# Patient Record
Sex: Female | Born: 1975 | Race: Black or African American | Hispanic: No | Smoking: Never smoker
Health system: Southern US, Community
[De-identification: ages and names within clinical notes are randomized; demographics above are authoritative.]

## PROBLEM LIST (undated history)

## (undated) DIAGNOSIS — D509 Iron deficiency anemia, unspecified: Secondary | ICD-10-CM

## (undated) DIAGNOSIS — Z973 Presence of spectacles and contact lenses: Secondary | ICD-10-CM

## (undated) DIAGNOSIS — O0990 Supervision of high risk pregnancy, unspecified, unspecified trimester: Secondary | ICD-10-CM

## (undated) DIAGNOSIS — Z3183 Encounter for assisted reproductive fertility procedure cycle: Secondary | ICD-10-CM

## (undated) DIAGNOSIS — D259 Leiomyoma of uterus, unspecified: Secondary | ICD-10-CM

## (undated) DIAGNOSIS — E282 Polycystic ovarian syndrome: Secondary | ICD-10-CM

## (undated) DIAGNOSIS — Z86711 Personal history of pulmonary embolism: Secondary | ICD-10-CM

## (undated) HISTORY — DX: Supervision of high risk pregnancy, unspecified, unspecified trimester: O09.90

## (undated) HISTORY — DX: Encounter for assisted reproductive fertility procedure cycle: Z31.83

## (undated) HISTORY — PX: MYOMECTOMY: SHX85

## (undated) HISTORY — DX: Personal history of pulmonary embolism: Z86.711

## (undated) HISTORY — PX: NO PAST SURGERIES: SHX2092

---

## 1998-04-22 ENCOUNTER — Other Ambulatory Visit: Admission: RE | Admit: 1998-04-22 | Discharge: 1998-04-22 | Payer: Self-pay | Admitting: Obstetrics and Gynecology

## 2001-09-23 ENCOUNTER — Other Ambulatory Visit: Admission: RE | Admit: 2001-09-23 | Discharge: 2001-09-23 | Payer: Self-pay | Admitting: Obstetrics and Gynecology

## 2002-10-06 ENCOUNTER — Other Ambulatory Visit: Admission: RE | Admit: 2002-10-06 | Discharge: 2002-10-06 | Payer: Self-pay | Admitting: Obstetrics and Gynecology

## 2002-12-13 ENCOUNTER — Emergency Department (HOSPITAL_COMMUNITY): Admission: EM | Admit: 2002-12-13 | Discharge: 2002-12-14 | Payer: Self-pay | Admitting: Emergency Medicine

## 2003-07-21 ENCOUNTER — Other Ambulatory Visit: Admission: RE | Admit: 2003-07-21 | Discharge: 2003-07-21 | Payer: Self-pay | Admitting: Obstetrics and Gynecology

## 2003-11-18 ENCOUNTER — Ambulatory Visit (HOSPITAL_COMMUNITY): Admission: RE | Admit: 2003-11-18 | Discharge: 2003-11-18 | Payer: Self-pay | Admitting: Obstetrics and Gynecology

## 2004-02-06 ENCOUNTER — Inpatient Hospital Stay (HOSPITAL_COMMUNITY): Admission: AD | Admit: 2004-02-06 | Discharge: 2004-02-09 | Payer: Self-pay | Admitting: *Deleted

## 2004-02-07 ENCOUNTER — Encounter (INDEPENDENT_AMBULATORY_CARE_PROVIDER_SITE_OTHER): Payer: Self-pay | Admitting: Specialist

## 2004-02-10 ENCOUNTER — Encounter: Admission: RE | Admit: 2004-02-10 | Discharge: 2004-03-11 | Payer: Self-pay | Admitting: Obstetrics and Gynecology

## 2004-03-07 ENCOUNTER — Other Ambulatory Visit: Admission: RE | Admit: 2004-03-07 | Discharge: 2004-03-07 | Payer: Self-pay | Admitting: Obstetrics and Gynecology

## 2004-03-12 ENCOUNTER — Encounter: Admission: RE | Admit: 2004-03-12 | Discharge: 2004-04-11 | Payer: Self-pay | Admitting: Obstetrics and Gynecology

## 2004-05-12 ENCOUNTER — Encounter: Admission: RE | Admit: 2004-05-12 | Discharge: 2004-06-11 | Payer: Self-pay | Admitting: Obstetrics and Gynecology

## 2004-06-12 ENCOUNTER — Encounter: Admission: RE | Admit: 2004-06-12 | Discharge: 2004-07-12 | Payer: Self-pay | Admitting: *Deleted

## 2004-08-10 ENCOUNTER — Encounter: Admission: RE | Admit: 2004-08-10 | Discharge: 2004-09-09 | Payer: Self-pay | Admitting: Obstetrics and Gynecology

## 2005-04-05 ENCOUNTER — Other Ambulatory Visit: Admission: RE | Admit: 2005-04-05 | Discharge: 2005-04-05 | Payer: Self-pay | Admitting: Obstetrics and Gynecology

## 2007-04-30 LAB — CONVERTED CEMR LAB: Pap Smear: NORMAL

## 2007-06-09 ENCOUNTER — Ambulatory Visit: Payer: Self-pay | Admitting: Internal Medicine

## 2007-06-09 DIAGNOSIS — D509 Iron deficiency anemia, unspecified: Secondary | ICD-10-CM | POA: Insufficient documentation

## 2007-06-09 DIAGNOSIS — R51 Headache: Secondary | ICD-10-CM | POA: Insufficient documentation

## 2007-06-09 DIAGNOSIS — N83209 Unspecified ovarian cyst, unspecified side: Secondary | ICD-10-CM | POA: Insufficient documentation

## 2007-06-09 DIAGNOSIS — R519 Headache, unspecified: Secondary | ICD-10-CM | POA: Insufficient documentation

## 2007-06-09 DIAGNOSIS — R32 Unspecified urinary incontinence: Secondary | ICD-10-CM | POA: Insufficient documentation

## 2007-06-09 DIAGNOSIS — A6000 Herpesviral infection of urogenital system, unspecified: Secondary | ICD-10-CM | POA: Insufficient documentation

## 2007-06-09 DIAGNOSIS — R350 Frequency of micturition: Secondary | ICD-10-CM | POA: Insufficient documentation

## 2007-06-09 LAB — CONVERTED CEMR LAB
Bilirubin Urine: NEGATIVE
Glucose, Urine, Semiquant: NEGATIVE
Ketones, urine, test strip: NEGATIVE
Nitrite: NEGATIVE
Protein, U semiquant: NEGATIVE
Specific Gravity, Urine: 1.025
Urobilinogen, UA: 0.2
WBC Urine, dipstick: NEGATIVE
pH: 7

## 2007-06-13 LAB — CONVERTED CEMR LAB
ALT: 14 units/L (ref 0–35)
AST: 14 units/L (ref 0–37)
Albumin: 4.3 g/dL (ref 3.5–5.2)
Alkaline Phosphatase: 34 units/L — ABNORMAL LOW (ref 39–117)
BUN: 13 mg/dL (ref 6–23)
Basophils Absolute: 0 10*3/uL (ref 0.0–0.1)
Basophils Relative: 0.1 % (ref 0.0–1.0)
Bilirubin, Direct: 0.1 mg/dL (ref 0.0–0.3)
CO2: 27 meq/L (ref 19–32)
Calcium: 9.5 mg/dL (ref 8.4–10.5)
Chloride: 106 meq/L (ref 96–112)
Cholesterol: 166 mg/dL (ref 0–200)
Creatinine, Ser: 0.8 mg/dL (ref 0.4–1.2)
Eosinophils Absolute: 0 10*3/uL (ref 0.0–0.6)
Eosinophils Relative: 0.7 % (ref 0.0–5.0)
Ferritin: 11.2 ng/mL (ref 10.0–291.0)
Folate: 11.1 ng/mL
GFR calc Af Amer: 108 mL/min
GFR calc non Af Amer: 90 mL/min
Glucose, Bld: 80 mg/dL (ref 70–99)
HCT: 36.7 % (ref 36.0–46.0)
HDL: 53.8 mg/dL (ref >39.0)
Hemoglobin: 11.6 g/dL — ABNORMAL LOW (ref 12.0–15.0)
LDL Cholesterol: 105 mg/dL — ABNORMAL HIGH (ref 0–99)
Lymphocytes Relative: 33.3 % (ref 12.0–46.0)
MCHC: 31.5 g/dL (ref 30.0–36.0)
MCV: 70.3 fL — ABNORMAL LOW (ref 78.0–100.0)
Monocytes Absolute: 0.4 10*3/uL (ref 0.2–0.7)
Monocytes Relative: 8.7 % (ref 3.0–11.0)
Neutro Abs: 2.5 10*3/uL (ref 1.4–7.7)
Neutrophils Relative %: 57.2 % (ref 43.0–77.0)
Platelets: 234 10*3/uL (ref 150–400)
Potassium: 4.1 meq/L (ref 3.5–5.1)
RBC: 5.22 M/uL — ABNORMAL HIGH (ref 3.87–5.11)
RDW: 13 % (ref 11.5–14.6)
Sodium: 140 meq/L (ref 135–145)
TSH: 0.81 microintl units/mL (ref 0.35–5.50)
Total Bilirubin: 0.7 mg/dL (ref 0.3–1.2)
Total CHOL/HDL Ratio: 3.1
Total Protein: 6.7 g/dL (ref 6.0–8.3)
Triglycerides: 34 mg/dL (ref 0–149)
VLDL: 7 mg/dL (ref 0–40)
Vitamin B-12: 318 pg/mL (ref 211–911)
WBC: 4.4 10*3/uL — ABNORMAL LOW (ref 4.5–10.5)

## 2007-06-23 ENCOUNTER — Encounter: Payer: Self-pay | Admitting: Internal Medicine

## 2008-04-06 LAB — CONVERTED CEMR LAB: Pap Smear: NORMAL

## 2009-02-28 ENCOUNTER — Ambulatory Visit: Payer: Self-pay | Admitting: Internal Medicine

## 2009-02-28 LAB — CONVERTED CEMR LAB
Bilirubin Urine: NEGATIVE
Blood in Urine, dipstick: NEGATIVE
Glucose, Urine, Semiquant: NEGATIVE
Ketones, urine, test strip: NEGATIVE
Nitrite: NEGATIVE
Protein, U semiquant: NEGATIVE
Specific Gravity, Urine: 1.02
Urobilinogen, UA: 0.2
WBC Urine, dipstick: NEGATIVE
pH: 7

## 2009-03-02 LAB — CONVERTED CEMR LAB
ALT: 13 units/L (ref 0–35)
AST: 14 units/L (ref 0–37)
Albumin: 4.3 g/dL (ref 3.5–5.2)
Alkaline Phosphatase: 41 units/L (ref 39–117)
BUN: 12 mg/dL (ref 6–23)
Basophils Absolute: 0 10*3/uL (ref 0.0–0.1)
Basophils Relative: 0.8 % (ref 0.0–3.0)
Bilirubin, Direct: 0 mg/dL (ref 0.0–0.3)
CO2: 27 meq/L (ref 19–32)
Calcium: 9.2 mg/dL (ref 8.4–10.5)
Chloride: 106 meq/L (ref 96–112)
Cholesterol: 172 mg/dL (ref 0–200)
Creatinine, Ser: 0.9 mg/dL (ref 0.4–1.2)
Eosinophils Absolute: 0 10*3/uL (ref 0.0–0.7)
Eosinophils Relative: 0.8 % (ref 0.0–5.0)
GFR calc non Af Amer: 93.1 mL/min (ref >60)
Glucose, Bld: 78 mg/dL (ref 70–99)
HCT: 38.1 % (ref 36.0–46.0)
HDL: 52 mg/dL (ref >39.00)
Hemoglobin: 12.3 g/dL (ref 12.0–15.0)
LDL Cholesterol: 112 mg/dL — ABNORMAL HIGH (ref 0–99)
Lymphocytes Relative: 31.1 % (ref 12.0–46.0)
Lymphs Abs: 1.5 10*3/uL (ref 0.7–4.0)
MCHC: 32.3 g/dL (ref 30.0–36.0)
MCV: 71.9 fL — ABNORMAL LOW (ref 78.0–100.0)
Monocytes Absolute: 0.4 10*3/uL (ref 0.1–1.0)
Monocytes Relative: 8 % (ref 3.0–12.0)
Neutro Abs: 2.8 10*3/uL (ref 1.4–7.7)
Neutrophils Relative %: 59.3 % (ref 43.0–77.0)
Platelets: 226 10*3/uL (ref 150.0–400.0)
Potassium: 4 meq/L (ref 3.5–5.1)
RBC: 5.3 M/uL — ABNORMAL HIGH (ref 3.87–5.11)
RDW: 13.2 % (ref 11.5–14.6)
Sodium: 141 meq/L (ref 135–145)
TSH: 1.06 microintl units/mL (ref 0.35–5.50)
Total Bilirubin: 0.8 mg/dL (ref 0.3–1.2)
Total CHOL/HDL Ratio: 3
Total Protein: 7.2 g/dL (ref 6.0–8.3)
Triglycerides: 42 mg/dL (ref 0.0–149.0)
VLDL: 8.4 mg/dL (ref 0.0–40.0)
WBC: 4.7 10*3/uL (ref 4.5–10.5)

## 2009-11-02 ENCOUNTER — Telehealth (INDEPENDENT_AMBULATORY_CARE_PROVIDER_SITE_OTHER): Payer: Self-pay | Admitting: *Deleted

## 2009-11-03 ENCOUNTER — Ambulatory Visit: Payer: Self-pay | Admitting: Internal Medicine

## 2009-11-03 DIAGNOSIS — L723 Sebaceous cyst: Secondary | ICD-10-CM | POA: Insufficient documentation

## 2009-11-03 DIAGNOSIS — N760 Acute vaginitis: Secondary | ICD-10-CM | POA: Insufficient documentation

## 2009-11-03 DIAGNOSIS — Z87898 Personal history of other specified conditions: Secondary | ICD-10-CM | POA: Insufficient documentation

## 2009-11-16 LAB — CONVERTED CEMR LAB
Chlamydia, Swab/Urine, PCR: NEGATIVE
GC Probe Amp, Urine: NEGATIVE

## 2010-06-06 NOTE — Progress Notes (Signed)
  Phone Note Call from Patient   Caller: Patient Call For: Madelin Headings MD Summary of Call: Pt had a flu shot several months ago, and now has a knot at the site???  Is asking to be seen ASAP. [phone]  This seems to be a chronic problem, but her sister looked at it, and is insisting she be seen. Initial call taken by: Lynann Beaver CMA,  November 02, 2009 11:37 AM  Follow-up for Phone Call        Appt scheduled tomorrow. Follow-up by: Lynann Beaver CMA,  November 02, 2009 11:39 AM

## 2010-06-06 NOTE — Consult Note (Signed)
Summary: [facility]  [facility]   Imported By: Kassie Mends 06/27/2007 09:20:10  _____________________________________________________________________  External Attachment:    Type:   Image     Comment:   [facility]

## 2010-06-06 NOTE — Assessment & Plan Note (Signed)
Summary: CPX NO PAP//CCM   Vital Signs:  Patient profile:   35 year old female Menstrual status:  regular LMP:     02/09/2009 Height:      61 inches Weight:      134 pounds BMI:     25.41 Pulse rate:   66 / minute BP sitting:   110 / 70  (left arm) Cuff size:   regular  Vitals Entered By: Romualdo Bolk, CMA (AAMA) (February 28, 2009 9:13 AM) CC: CPX-no pap- Pt has a gyn who does paps. LMP (date): 02/09/2009 LMP - Character: normal Menarche (age onset years): 13   Menses interval (days): 28-30 Menstrual flow (days): 5-7 Menstrual Status regular Enter LMP: 02/09/2009 Last PAP Result normal   History of Present Illness: Jessica Dudley comes in   today for   PV. Is generally well. Since last visit  here  there have been no major changes in health status  .  No major concerns .  To get labs today . Sees gyne  .  UTD   due for tdap.   No major changes in   family hx   Preventive Care Screening  Pap Smear:    Date:  04/06/2008    Results:  normal    Preventive Screening-Counseling & Management  Alcohol-Tobacco     Alcohol drinks/day: <1     Alcohol type: Coolers     Smoking Status: never  Caffeine-Diet-Exercise     Caffeine use/day: 0     Does Patient Exercise: yes  Hep-HIV-STD-Contraception     Dental Visit-last 6 months yes     Sun Exposure-Excessive: no  Safety-Violence-Falls     Seat Belt Use: yes     Firearms in the Home: no firearms in the home     Smoke Detectors: yes      Blood Transfusions:  no.    Current Medications (verified): 1)  Valtrex 500 Mg  Tabs (Valacyclovir Hcl) .Marland Kitchen.. 1 By Mouth Two Times A Day Prn 2)  Multi-Vitamin   Tabs (Multiple Vitamin)  Allergies (verified): No Known Drug Allergies  Past History:  Past medical, surgical, family and social histories (including risk factors) reviewed, and no changes noted (except as noted below).  Past Medical History: Reviewed history from 06/09/2007 and no changes  required. Headache Urinary incontinence g1p1  Past Surgical History: Reviewed history from 06/09/2007 and no changes required. Cryotherapy  Past History:  Care Management: Gynecology: Marcelle Overlie  Family History: Reviewed history from 06/09/2007 and no changes required. Family History Hypertension-Mom Family History Diabetes 1st degree relative-Maternal Grandmother Family History of Prostate CA 1st degree relative <50- Paternal Uncle Family History Other cancer-Leukemia- Paternal Cousin; Brain Cancer- Paternal Cousin  Social History: Reviewed history from 06/09/2007 and no changes required. Divorced Never Smoked Alcohol use-yes 2x per month.  Regular exercise-no HH of 3 with daughter  no pets   brother  Drug use-no works [employer] Caffeine use/day:  0 Does Patient Exercise:  yes Dental Care w/in 6 mos.:  yes Seat Belt Use:  yes Sun Exposure-Excessive:  no Blood Transfusions:  no  Review of Systems  The patient denies anorexia, fever, weight loss, weight gain, vision loss, decreased hearing, hoarseness, chest pain, syncope, dyspnea on exertion, peripheral edema, prolonged cough, headaches, hemoptysis, abdominal pain, melena, hematochezia, severe indigestion/heartburn, hematuria, incontinence, muscle weakness, suspicious skin lesions, transient blindness, difficulty walking, depression, unusual weight change, abnormal bleeding, enlarged lymph nodes, angioedema, and breast masses.         episode  abdominal bloating ,   Physical Exam General Appearance: well developed, well nourished, no acute distress Eyes: conjunctiva and lids normal, PERRLA, EOMI, WNL glasses  Ears, Nose, Mouth, Throat: TM clear, nares clear, oral exam WNL Neck: supple, no lymphadenopathy, no thyromegaly, no JVD Respiratory: clear to auscultation and percussion, respiratory effort normal Cardiovascular: regular rate and rhythm, S1-S2, no murmur, rub or gallop, no bruits, peripheral pulses normal and symmetric,  no cyanosis, clubbing, edema or varicosities Chest: no scars, masses, tenderness; no asymmetry, skin changes, nipple discharge   Gastrointestinal: soft, non-tender; no hepatosplenomegaly, masses; active bowel sounds all quadrants,  Genitourinary: per gyne  Lymphatic: no cervical, axillary or inguinal adenopathy Musculoskeletal: gait normal, muscle tone and strength WNL, no joint swelling, effusions, discoloration, crepitus  Skin: clear, good turgor, color WNL, no rashes, lesions, or ulcerations Neurologic: normal mental status, normal reflexes, normal strength, sensation, and motion Psychiatric: alert; oriented to person, place and time Other Exam:     Impression & Recommendations:  Problem # 1:  ADULT WELLNESS (ICD-V70.0) Discussed nutrition,exercise,diet,healthy weight, vitamin D and calcium.  Orders: UA Dipstick w/Micro (automated) (81001) Venipuncture (81191) TLB-Lipid Panel (80061-LIPID) TLB-BMP (Basic Metabolic Panel-BMET) (80048-METABOL) TLB-CBC Platelet - w/Differential (85025-CBCD) TLB-Hepatic/Liver Function Pnl (80076-HEPATIC) TLB-TSH (Thyroid Stimulating Hormone) (84443-TSH)  Problem # 2:  SCREENING EXAMINATION FOR VENEREAL DISEASE (ICD-V74.5)  Orders: T-HIV Antibody  (Reflex) (47829-56213) T-RPR (Syphilis) (08657-84696)  Problem # 3:  FAMILY HISTORY DIABETES 1ST DEGREE RELATIVE (ICD-V18.0) Assessment: Comment Only  Problem # 4:  GENITAL HERPES (ICD-054.10) stable  only ocass outbreak   Complete Medication List: 1)  Valtrex 500 Mg Tabs (Valacyclovir hcl) .Marland Kitchen.. 1 by mouth two times a day prn 2)  Multi-vitamin Tabs (Multiple vitamin)  Other Orders: Admin 1st Vaccine (29528) Flu Vaccine 30yrs + (41324) Tdap => 33yrs IM (40102) Admin of Any Addtl Vaccine (72536)  Patient Instructions: 1)  Mediterranean type diet   ( nuts are good for a snack)  2)  Limit sweets and creamy things  3)  exercise get adeqaute sleep . 4)  You will be informed of lab results when  available.  Prescriptions: VALTREX 500 MG  TABS (VALACYCLOVIR HCL) 1 by mouth two times a day prn  #30 x 3   Entered and Authorized by:   Madelin Headings MD   Signed by:   Madelin Headings MD on 02/28/2009   Method used:   Print then Give to Patient   RxID:   [id number]      Flu Vaccine Consent Questions     Do you have a history of severe allergic reactions to this vaccine? no    Any prior history of allergic reactions to egg and/or gelatin? no    Do you have a sensitivity to the preservative Thimersol? no    Do you have a past history of Guillan-Barre Syndrome? no    Do you currently have an acute febrile illness? no    Have you ever had a severe reaction to latex? no    Vaccine information given and explained to patient? yes    Are you currently pregnant? no    Lot Number:[id number]   Exp Date:11/03/2009   Site Given  Left Deltoid IMlu Romualdo Bolk, CMA (AAMA)  February 28, 2009 9:17 AM   Immunizations Administered:  Tetanus Vaccine:    Vaccine Type: Tdap    Site: right deltoid    Mfr: Sanofi Pasteur    Dose: 0.5 ml    Route: IM    Given  by: Romualdo Bolk, CMA (AAMA)    Exp. Date: 12/03/2010    Lot #: [id number]   Laboratory Results   Urine Tests    Routine Urinalysis   Color: yellow Appearance: Clear Glucose: negative   (Normal Range: Negative) Bilirubin: negative   (Normal Range: Negative) Ketone: negative   (Normal Range: Negative) Spec. Gravity: 1.020   (Normal Range: 1.003-1.035) Blood: negative   (Normal Range: Negative) pH: 7.0   (Normal Range: 5.0-8.0) Protein: negative   (Normal Range: Negative) Urobilinogen: 0.2   (Normal Range: 0-1) Nitrite: negative   (Normal Range: Negative) Leukocyte Esterace: negative   (Normal Range: Negative)    Comments: Rita Ohara  February 28, 2009 1:01 PM

## 2010-06-06 NOTE — Assessment & Plan Note (Signed)
Summary: new pt ov/will fast/ccm   Vital Signs:  Patient Profile:   35 Years Old Female Height:     61.5 inches Weight:      129 pounds Pulse rate:   78 / minute BP sitting:   100 / 60  (left arm) Cuff size:   regular  Vitals Entered By: Romualdo Bolk, CMA (June 09, 2007 9:28 AM)                 Chief Complaint:  New Patient to get establish.  History of Present Illness: Jessica Dudley is here for a new patient appt. Pt is fasting for labs. Pt wants to discuss urinary frequency but she states that she drinks alot of water. Has been on  meds for ovarian cyst suppression.  Has had se of OCPs.  and doesn't like to take it.   ? constipation.    No dysmenorrhea.    Chronic urinary frequency increases with cysts but has the symptoms  w/o cyst.     Has nocturia  x1 .   NO uti  No sodas.   Herbal teas usu not caffiene.     Problematic over 3-4 years. No urge incontinencebut stress incontinence  x2 with sneezing.  No hx of bladder infections.  NO hematuria  Always irreg with consipation.  uses diet changes to help.   goes q 2-3 days.     Had blood in stool with constipation when hard stool only. Childbirth 2005.  ? if symptom worse since then. No regular blood work.  except anemia check  following via gyne.    Remote hx of blood donation. not recently .  Had CMV and stopped donation.   REmotehx genital herpes.  On meds as needed and needs refill.   Current Allergies (reviewed today): No known allergies   Past Medical History:    Headache    Urinary incontinence    g1p1  Past Surgical History:    Cryotherapy   Family History:    Family History Hypertension-Mom    Family History Diabetes 1st degree relative-Maternal Grandmother    Family History of Prostate CA 1st degree relative <50- Paternal Uncle    Family History Other cancer-Leukemia- Paternal Cousin; Brain Cancer- Paternal Cousin  Social History:    Divorced    Never Smoked    Alcohol use-yes 2x per month.       Regular exercise-no    HH of 2 with daughter  no pets     Drug use-no   Risk Factors:  Tobacco use:  never Drug use:  no Alcohol use:  yes    Type:  Coolers    Drinks per day:  <1    Comments:  Socially Exercise:  no Seatbelt use:  100 %  PAP Smear History:     Date of Last PAP Smear:  04/30/2007    Results:  Normal    Review of Systems  The patient denies anorexia, fever, weight loss, chest pain, dyspnea on exhertion, peripheral edema, prolonged cough, melena, hematochezia, severe indigestion/heartburn, hematuria, difficulty walking, and enlarged lymph nodes.         ? gets ovary cys pain with some exercise.   Physical Exam  General:     Well-developed,well-nourished,in no acute distress; alert,appropriate and cooperative throughout examination Head:     Normocephalic and atraumatic without obvious abnormalities. No apparent alopecia or balding. Eyes:     vision grossly intact, pupils equal, pupils round, and pupils reactive to light.  Ears:     R ear normal, L ear normal, and no external deformities.   Nose:     no external deformity and no nasal discharge.   Mouth:     pharynx pink and moist and no erythema.   Neck:     No deformities, masses, or tenderness noted.no thyromegaly.   Lungs:     Normal respiratory effort, chest expands symmetrically. Lungs are clear to auscultation, no crackles or wheezes. Heart:     Normal rate and regular rhythm. S1 and S2 normal without gallop, murmur, click, rub or other extra sounds. Abdomen:     Bowel sounds positive,abdomen soft and non-tender without masses, organomegaly or hernias noted. Msk:     normal ROM, no joint warmth, and no redness over joints.   Pulses:     R and L carotid,radial,femoral,dorsalis pedis and posterior tibial pulses are full and equal bilaterally Extremities:     No clubbing, cyanosis, edema, or deformity noted with normal full range of motion of all joints.   Neurologic:     alert &  oriented X3, strength normal in all extremities, gait normal, and DTRs symmetrical and normal.   Skin:     turgor normal, color normal, no rashes, and no suspicious lesions.   Cervical Nodes:     no anterior cervical adenopathy and no posterior cervical adenopathy.   Inguinal Nodes:     no R inguinal adenopathy and no L inguinal adenopathy.      Impression & Recommendations:  Problem # 1:  FREQUENCY, URINARY (ICD-788.41) ? motility dysfunction   if lab unrevealing rec urology consult .  Orders: Venipuncture (64403) TLB-Lipid Panel (80061-LIPID) TLB-B12 + Folate Pnl (82746_82607-B12/FOL) TLB-BMP (Basic Metabolic Panel-BMET) (80048-METABOL) TLB-Hepatic/Liver Function Pnl (80076-HEPATIC) TLB-CBC Platelet - w/Differential (85025-CBCD) TLB-TSH (Thyroid Stimulating Hormone) (84443-TSH) TLB-Ferritin (82728-FER) UA Dipstick w/o Micro (47425) Urology Referral (Urology)   Problem # 2:  ANEMIA (ICD-285.9) hx of same and will evaluate Orders: Venipuncture (95638) TLB-Lipid Panel (80061-LIPID) TLB-B12 + Folate Pnl (75643_32951-O84/ZYS) TLB-BMP (Basic Metabolic Panel-BMET) (80048-METABOL) TLB-Hepatic/Liver Function Pnl (80076-HEPATIC) TLB-CBC Platelet - w/Differential (85025-CBCD) TLB-TSH (Thyroid Stimulating Hormone) (84443-TSH) TLB-Ferritin (82728-FER) UA Dipstick w/o Micro (81002)   Problem # 3:  GENITAL HERPES (ICD-054.10) discuss use of med and refillf for today.  Problem # 4:  Hx of OTHER AND UNSPECIFIED OVARIAN CYST (ICD-620.2) Assessment: Comment Only  Complete Medication List: 1)  Valtrex 500 Mg Tabs (Valacyclovir hcl) .Marland Kitchen.. 1 by mouth two times a day prn 2)  Multi-vitamin Tabs (Multiple vitamin)  PAP Screening:    Last PAP smear:  04/30/2007  PAP Smear Results:    Date of Exam:  04/30/2007    Results:  Normal  Osteoporosis Risk Assessment:  Risk Factors for Fracture or Low Bone Density:   Smoking status:       never   Patient Instructions: 1)  will do  urology referral 2)  decide on follow up depending on lab results    Prescriptions: VALTREX 500 MG  TABS (VALACYCLOVIR HCL) 1 by mouth two times a day prn  #30 x 3   Entered and Authorized by:   Madelin Headings MD   Signed by:   Madelin Headings MD on 06/09/2007   Method used:   Electronically sent to ...       [id number]       [address]       [place], [place]  [address]       Ph: [phone] or [phone]       Fax: [phone]   RxID:   [id number]  ] Laboratory Results   Urine Tests    Routine Urinalysis   Color: yellow Appearance: Clear Glucose: negative   (Normal Range: Negative) Bilirubin: negative   (Normal Range: Negative) Ketone: negative   (Normal Range: Negative) Spec. Gravity: 1.025   (Normal Range: 1.003-1.035) Blood: trace-lysed   (Normal Range: Negative) pH: 7.0   (Normal Range: 5.0-8.0) Protein: negative   (Normal Range: Negative) Urobilinogen: 0.2   (Normal Range: 0-1) Nitrite: negative   (Normal Range: Negative) Leukocyte Esterace: negative   (Normal Range: Negative)    Comments: ...................................................................Milica Zimonjic  June 09, 2007 11:30 AM

## 2010-06-06 NOTE — Letter (Signed)
Summary: patient history  patient history   Imported By: Kassie Mends 06/24/2007 10:29:46  _____________________________________________________________________  External Attachment:    Type:   Image     Comment:   patient history

## 2010-06-06 NOTE — Assessment & Plan Note (Signed)
Summary: masses on arm from old vaccinations???/dm   Vital Signs:  Patient profile:   35 year old female Menstrual status:  regular LMP:     10/20/2009 Weight:      132 pounds Pulse rate:   78 / minute BP sitting:   110 / 60  (right arm) Cuff size:   regular  Vitals Entered By: Romualdo Bolk, CMA (AAMA) (November 03, 2009 8:10 AM) CC: Blackheads with swelling on left arm and left shoulder blade. Pt is also having some vaginal irriatation, no odor, creamy white discharge. Pt is some itching as well. LMP (date): 10/20/2009 LMP - Character: normal Menarche (age onset years): 13   Menses interval (days): 28-30 Menstrual flow (days): 5-7 Enter LMP: 10/20/2009 Last PAP Result normal   History of Present Illness: Jessica Dudley comes in today  for acute visit for  concern about   know from flu shot  given a while back   Noted recent know  on left arma nd tender some and no redness.  ?  if getting bigger.  See above.  Also   having vaginal irritiation for about 1 week with white   dc / if nl using valtrex as needed generic . Unsure if hsv outbreak . 1 partner condoms .    no odor or sig itching . no change in urinary signs of frequency   .  Preventive Screening-Counseling & Management  Alcohol-Tobacco     Alcohol drinks/day: <1     Alcohol type: Coolers     Smoking Status: never  Caffeine-Diet-Exercise     Caffeine use/day: 0     Does Patient Exercise: yes  Current Medications (verified): 1)  Valtrex 500 Mg  Tabs (Valacyclovir Hcl) .Marland Kitchen.. 1 By Mouth Two Times A Day Prn 2)  Multi-Vitamin   Tabs (Multiple Vitamin)  Allergies (verified): No Known Drug Allergies  Past History:  Past medical, surgical, family and social histories (including risk factors) reviewed for relevance to current acute and chronic problems.  Past Medical History: Reviewed history from 06/09/2007 and no changes required. Headache Urinary incontinence g1p1  Past Surgical History: Reviewed history  from 06/09/2007 and no changes required. Cryotherapy  Past History:  Care Management: Gynecology: Marcelle Overlie  Family History: Reviewed history from 06/09/2007 and no changes required. Family History Hypertension-Mom Family History Diabetes 1st degree relative-Maternal Grandmother Family History of Prostate CA 1st degree relative <50- Paternal Uncle Family History Other cancer-Leukemia- Paternal Cousin; Brain Cancer- Paternal Cousin  Social History: Reviewed history from 02/28/2009 and no changes required. Divorced Never Smoked Alcohol use-yes 2x per month.  Regular exercise-no HH of 3 with daughter  no pets   brother  Drug use-no works [employer]  Review of Systems  The patient denies anorexia, fever, chest pain, syncope, abdominal pain, hematuria, incontinence, and genital sores.         urinary frequency common.  hx of hsv taking meds as needed     Physical Exam  General:  Well-developed,well-nourished,in no acute distress; alert,appropriate and cooperative throughout examination Head:  normocephalic and atraumatic.   Eyes:  vision grossly intact.   Neck:  No deformities, masses, or tenderness noted. Lungs:  normal respiratory effort and no intercostal retractions.   Heart:  normal rate and regular rhythm.   Abdomen:  soft, non-tender, no guarding, no rebound tenderness, no hepatomegaly, and no splenomegaly.   Genitalia:  normal introitus, no external lesions, mucosa pink and moist, and no vaginal or cervical lesions.   white grey discharge  homogeneous with minimal external redness  no lesions    .  Pulses:  nl cap refill  Neurologic:  alert & oriented X3 and gait normal.   Skin:  left arm with central  pore black with 1-1.5 cm cytic assoicated lesion non tendern  second 2 mmone on shoudller   no other lesions no redness or tenderness Cervical Nodes:  No lymphadenopathy noted Inguinal Nodes:  No significant adenopathy Psych:  Oriented X3, normally interactive, good eye  contact, not anxious appearing, and not depressed appearing.     Impression & Recommendations:  Problem # 1:  SEBACEOUS CYST (ICD-706.2) not from immunizations   and no other alarm features she has 2 in the area .   local care   follow up if concerned  red etc.    Problem # 2:  VAGINITIS (ICD-616.10) unimpressive exam  will do screen and consider empiric rx  if needed   and follow up .  no signs of hsv today .  Her updated medication list for this problem includes:    Flagyl 500 Mg Tabs (Metronidazole) .Marland Kitchen... 1 by mouth two times a day for vaginitis  Orders: T-Chlamydia & GC Probe, Urine (87491/[id number])  Problem # 3:  GENITAL HERPES, HX OF (ICD-V13.8) Assessment: Comment Only  Complete Medication List: 1)  Valtrex 500 Mg Tabs (Valacyclovir hcl) .Marland Kitchen.. 1 by mouth two times a day prn 2)  Multi-vitamin Tabs (Multiple vitamin) 3)  Flagyl 500 Mg Tabs (Metronidazole) .Marland Kitchen.. 1 by mouth two times a day for vaginitis  Patient Instructions: 1)  i think the knot is  a skin cyst poss sebaceous . 2)  You will be informed of lab results when available.  3)  poss bacterial  vagenosis tha can be treated if not getting better  Prescriptions: FLAGYL 500 MG TABS (METRONIDAZOLE) 1 by mouth two times a day for vaginitis  #14 x 0   Entered and Authorized by:   Madelin Headings MD   Signed by:   Madelin Headings MD on 11/03/2009   Method used:   Print then Give to Patient   RxID:   [id number]

## 2010-08-04 ENCOUNTER — Emergency Department (INDEPENDENT_AMBULATORY_CARE_PROVIDER_SITE_OTHER): Payer: Managed Care, Other (non HMO)

## 2010-08-04 ENCOUNTER — Emergency Department (HOSPITAL_BASED_OUTPATIENT_CLINIC_OR_DEPARTMENT_OTHER)
Admission: EM | Admit: 2010-08-04 | Discharge: 2010-08-04 | Disposition: A | Discharge status: Home / Self Care | Payer: Managed Care, Other (non HMO) | Attending: Emergency Medicine | Admitting: Emergency Medicine

## 2010-08-04 DIAGNOSIS — R1031 Right lower quadrant pain: Secondary | ICD-10-CM | POA: Insufficient documentation

## 2010-08-04 DIAGNOSIS — D259 Leiomyoma of uterus, unspecified: Secondary | ICD-10-CM | POA: Insufficient documentation

## 2010-08-04 DIAGNOSIS — N83209 Unspecified ovarian cyst, unspecified side: Secondary | ICD-10-CM | POA: Insufficient documentation

## 2010-08-04 LAB — URINALYSIS, ROUTINE W REFLEX MICROSCOPIC
Bilirubin Urine: NEGATIVE
Glucose, UA: NEGATIVE mg/dL
Hgb urine dipstick: NEGATIVE
Ketones, ur: NEGATIVE mg/dL
Nitrite: NEGATIVE
Protein, ur: NEGATIVE mg/dL
Specific Gravity, Urine: 1.025 (ref 1.005–1.030)
Urobilinogen, UA: 1 mg/dL (ref 0.0–1.0)
pH: 7 (ref 5.0–8.0)

## 2010-08-04 LAB — PREGNANCY, URINE: Preg Test, Ur: NEGATIVE

## 2010-09-30 ENCOUNTER — Encounter: Payer: Self-pay | Admitting: Internal Medicine

## 2010-09-30 DIAGNOSIS — N83209 Unspecified ovarian cyst, unspecified side: Secondary | ICD-10-CM | POA: Insufficient documentation

## 2011-05-14 ENCOUNTER — Other Ambulatory Visit (INDEPENDENT_AMBULATORY_CARE_PROVIDER_SITE_OTHER): Payer: Managed Care, Other (non HMO)

## 2011-05-14 DIAGNOSIS — Z0000 Encounter for general adult medical examination without abnormal findings: Secondary | ICD-10-CM

## 2011-05-14 LAB — POCT URINALYSIS DIPSTICK
Bilirubin, UA: NEGATIVE
Blood, UA: NEGATIVE
Glucose, UA: NEGATIVE
Ketones, UA: NEGATIVE
Leukocytes, UA: NEGATIVE
Nitrite, UA: NEGATIVE
Spec Grav, UA: >=1.03
Urobilinogen, UA: 1
pH, UA: 5.5

## 2011-05-14 LAB — LIPID PANEL
Cholesterol: 159 mg/dL (ref 0–200)
HDL: 50.2 mg/dL (ref >39.00)
LDL Cholesterol: 101 mg/dL — ABNORMAL HIGH (ref 0–99)
Total CHOL/HDL Ratio: 3
Triglycerides: 37 mg/dL (ref 0.0–149.0)
VLDL: 7.4 mg/dL (ref 0.0–40.0)

## 2011-05-14 LAB — CBC WITH DIFFERENTIAL/PLATELET
Basophils Absolute: 0 10*3/uL (ref 0.0–0.1)
Basophils Relative: 0.6 % (ref 0.0–3.0)
Eosinophils Absolute: 0 10*3/uL (ref 0.0–0.7)
Eosinophils Relative: 0.6 % (ref 0.0–5.0)
HCT: 35.1 % — ABNORMAL LOW (ref 36.0–46.0)
Hemoglobin: 11.2 g/dL — ABNORMAL LOW (ref 12.0–15.0)
Lymphocytes Relative: 30.4 % (ref 12.0–46.0)
Lymphs Abs: 1.3 10*3/uL (ref 0.7–4.0)
MCHC: 32 g/dL (ref 30.0–36.0)
MCV: 68.6 fl — ABNORMAL LOW (ref 78.0–100.0)
Monocytes Absolute: 0.5 10*3/uL (ref 0.1–1.0)
Monocytes Relative: 10.3 % (ref 3.0–12.0)
Neutro Abs: 2.6 10*3/uL (ref 1.4–7.7)
Neutrophils Relative %: 58.1 % (ref 43.0–77.0)
Platelets: 222 10*3/uL (ref 150.0–400.0)
RBC: 5.11 Mil/uL (ref 3.87–5.11)
RDW: 15.8 % — ABNORMAL HIGH (ref 11.5–14.6)
WBC: 4.4 10*3/uL — ABNORMAL LOW (ref 4.5–10.5)

## 2011-05-14 LAB — BASIC METABOLIC PANEL
BUN: 10 mg/dL (ref 6–23)
CO2: 26 mEq/L (ref 19–32)
Calcium: 9.2 mg/dL (ref 8.4–10.5)
Chloride: 110 mEq/L (ref 96–112)
Creatinine, Ser: 0.8 mg/dL (ref 0.4–1.2)
GFR: 102.29 mL/min (ref >60.00)
Glucose, Bld: 83 mg/dL (ref 70–99)
Potassium: 4.3 mEq/L (ref 3.5–5.1)
Sodium: 142 mEq/L (ref 135–145)

## 2011-05-14 LAB — TSH: TSH: 0.79 u[IU]/mL (ref 0.35–5.50)

## 2011-05-15 LAB — HEPATIC FUNCTION PANEL
ALT: 14 U/L (ref 0–35)
AST: 15 U/L (ref 0–37)
Albumin: 4.1 g/dL (ref 3.5–5.2)
Alkaline Phosphatase: 37 U/L — ABNORMAL LOW (ref 39–117)
Bilirubin, Direct: 0.1 mg/dL (ref 0.0–0.3)
Total Bilirubin: 0.9 mg/dL (ref 0.3–1.2)
Total Protein: 6.7 g/dL (ref 6.0–8.3)

## 2011-05-21 ENCOUNTER — Encounter: Payer: Managed Care, Other (non HMO) | Admitting: Internal Medicine

## 2011-05-30 ENCOUNTER — Other Ambulatory Visit (HOSPITAL_COMMUNITY)
Admission: RE | Admit: 2011-05-30 | Discharge: 2011-05-30 | Disposition: A | Discharge status: Home / Self Care | Payer: Managed Care, Other (non HMO) | Source: Ambulatory Visit | Attending: Obstetrics and Gynecology | Admitting: Obstetrics and Gynecology

## 2011-05-30 ENCOUNTER — Other Ambulatory Visit: Payer: Self-pay | Admitting: Obstetrics and Gynecology

## 2011-05-30 DIAGNOSIS — N760 Acute vaginitis: Secondary | ICD-10-CM | POA: Insufficient documentation

## 2011-05-30 DIAGNOSIS — Z01419 Encounter for gynecological examination (general) (routine) without abnormal findings: Secondary | ICD-10-CM | POA: Insufficient documentation

## 2011-06-13 ENCOUNTER — Ambulatory Visit (INDEPENDENT_AMBULATORY_CARE_PROVIDER_SITE_OTHER): Payer: Managed Care, Other (non HMO) | Admitting: Internal Medicine

## 2011-06-13 ENCOUNTER — Encounter: Payer: Self-pay | Admitting: Internal Medicine

## 2011-06-13 VITALS — BP 100/60 | HR 78 | Ht 61.5 in | Wt 132.0 lb

## 2011-06-13 DIAGNOSIS — R8290 Unspecified abnormal findings in urine: Secondary | ICD-10-CM | POA: Insufficient documentation

## 2011-06-13 DIAGNOSIS — Z23 Encounter for immunization: Secondary | ICD-10-CM

## 2011-06-13 DIAGNOSIS — IMO0001 Reserved for inherently not codable concepts without codable children: Secondary | ICD-10-CM | POA: Insufficient documentation

## 2011-06-13 DIAGNOSIS — D649 Anemia, unspecified: Secondary | ICD-10-CM

## 2011-06-13 DIAGNOSIS — D259 Leiomyoma of uterus, unspecified: Secondary | ICD-10-CM

## 2011-06-13 DIAGNOSIS — R82998 Other abnormal findings in urine: Secondary | ICD-10-CM

## 2011-06-13 DIAGNOSIS — Z0000 Encounter for general adult medical examination without abnormal findings: Secondary | ICD-10-CM | POA: Insufficient documentation

## 2011-06-13 NOTE — Progress Notes (Signed)
Subjective:    Patient ID: Jessica Dudley, female    DOB: Nov 24, 1975, 36 y.o.   MRN: [id number]  HPI Patient comes in today for Preventive Health Care visit  Since last visit 2 years ago has had abd pain and bleeding  Seeing gyne . Fibroids under eval to get [place] next week.    Anemia got worse and  Taking iron Vitronc   Ran out. No cp sob except poss with anxiety. No major change in exercise tolerance.   No injury . neds flu shot.   Review of Systems ROS:  GEN/ HEENT: No fever, significant weight changes sweats headaches vision problems hearing changes, CV/ PULM; No chest pain  cough, syncope,edema  change in exercise tolerance. GI /GU: No adominal pain, vomiting, change in bowel habits. No blood in the stool.  symptoms. SKIN/HEME: ,no acute skin rashes suspicious lesions or bleeding. No lymphadenopathy, nodules, masses.  NEURO/ PSYCH:  No neurologic signs such as weakness numbness. No depression anxiety. IMM/ Allergy: No unusual infections.  Allergy .   REST of 12 system review negative except as per HPI  Past Medical History  Diagnosis Date  . Headache   . Urinary incontinence     History   Social History  . Marital Status: Married    Spouse Name: N/A    Number of Children: N/A  . Years of Education: N/A   Occupational History  . Not on file.   Social History Main Topics  . Smoking status: Never Smoker   . Smokeless tobacco: Not on file  . Alcohol Use: Yes     2x a month  . Drug Use: No  . Sexually Active:    Other Topics Concern  . Not on file   Social History Narrative   DivorcedRegular exercise- noHH of 2 with daughter no pets Age 42 daughter Works [employer]   40 hours     Past Surgical History  Procedure Date  . Cryotherapy     Family History  Problem Relation Age of Onset  . Hypertension Mother   . Prostate cancer Paternal Uncle   . Diabetes Maternal Grandmother   . Leukemia Cousin   . Brain cancer Cousin     No Known Allergies  No current  outpatient prescriptions on file prior to visit.    BP 100/60  Pulse 78  Ht 5' 1.5" (1.562 m)  Wt 132 lb (59.875 kg)  BMI 24.54 kg/m2  LMP 06/12/2011        Objective:   Physical Exam Physical Exam: Vital signs reviewed WUJ:WJXB is a well-developed well-nourished alert cooperative  white female who appears her stated age in no acute distress.  HEENT: normocephalic atraumatic , Eyes: PERRL EOM's full, conjunctiva clear, Nares: paten,t no deformity discharge or tenderness., Ears: no deformity EAC's clear TMs with normal landmarks. Mouth: clear OP, no lesions, edema.  Moist mucous membranes. Dentition in adequate repair. NECK: supple without masses, thyromegaly or bruits. CHEST/PULM:  Clear to auscultation and percussion breath sounds equal no wheeze , rales or rhonchi. No chest wall deformities or tenderness. CV: PMI is nondisplaced, S1 S2 no gallops, murmurs, rubs. Peripheral pulses are full without delay.No JVD .  Breast: normal by inspection . No dimpling, discharge, masses, tenderness or discharge .  ABDOMEN: Bowel sounds normal nontender  No guard or rebound, no hepato splenomegal no CVA tenderness.  No hernia. Extremtities:  No clubbing cyanosis or edema, no acute joint swelling or redness no focal atrophy NEURO:  Oriented x3,  cranial nerves 3-12 appear to be intact, no obvious focal weakness,gait within normal limits no abnormal reflexes or asymmetrical SKIN: No acute rashes normal turgor, color, no bruising or petechiae. PSYCH: Oriented, good eye contact, no obvious depression anxiety, cognition and judgment appear normal. LN: no cervical axillary inguinal adenopathy     Lab Results  Component Value Date   WBC 4.4 Repeated and verified X2.* 05/14/2011   HGB 11.2* 05/14/2011   HCT 35.1* 05/14/2011   PLT 222.0 05/14/2011   GLUCOSE 83 05/14/2011   CHOL 159 05/14/2011   TRIG 37.0 05/14/2011   HDL 50.20 05/14/2011   LDLCALC 101* 05/14/2011   ALT 14 05/14/2011   AST 15 05/14/2011   NA 142  05/14/2011   K 4.3 05/14/2011   CL 110 05/14/2011   CREATININE 0.8 05/14/2011   BUN 10 05/14/2011   CO2 26 05/14/2011   TSH 0.79 05/14/2011       Assessment & Plan:    Preventive Health Care Counseled regarding healthy nutrition, exercise, sleep, injury prevention, calcium vit d and healthy weight . Flu shot today  Anemia  Presumed iron  defic or combo  Vegan iet but does do dairy also inc rbc ct  Could have trait  But doing better    Fibroids  And poss bleeding from this under eval per GYNE.  Abn UA 1 + protein may be normal   On menses today  Come back for repeat UA when well

## 2011-06-13 NOTE — Patient Instructions (Signed)
Continue iron supplementation and get your GYNE to review labs. BLood count need tot be follow up and the GYNE can do this or if not then call us for follow up lab test. Also take a multivitamin that has b vitamins in ot because you are a vegetarian diet.   Call ahead  On a future dat when not having period  To get a urinalysis.

## 2011-06-25 ENCOUNTER — Other Ambulatory Visit (INDEPENDENT_AMBULATORY_CARE_PROVIDER_SITE_OTHER): Payer: Managed Care, Other (non HMO)

## 2011-06-25 DIAGNOSIS — R82998 Other abnormal findings in urine: Secondary | ICD-10-CM

## 2011-06-25 DIAGNOSIS — R8290 Unspecified abnormal findings in urine: Secondary | ICD-10-CM

## 2011-06-25 LAB — POCT URINALYSIS DIPSTICK
Bilirubin, UA: NEGATIVE
Blood, UA: NEGATIVE
Glucose, UA: NEGATIVE
Leukocytes, UA: NEGATIVE
Nitrite, UA: NEGATIVE
Spec Grav, UA: >=1.03
Urobilinogen, UA: 1
pH, UA: 5.5

## 2011-07-26 NOTE — Progress Notes (Signed)
Quick Note:  Pt aware of results. ______ 

## 2012-05-29 ENCOUNTER — Other Ambulatory Visit: Payer: Self-pay | Admitting: Obstetrics and Gynecology

## 2012-05-29 ENCOUNTER — Other Ambulatory Visit (HOSPITAL_COMMUNITY)
Admission: RE | Admit: 2012-05-29 | Discharge: 2012-05-29 | Disposition: A | Discharge status: Home / Self Care | Payer: Managed Care, Other (non HMO) | Source: Ambulatory Visit | Attending: Obstetrics and Gynecology | Admitting: Obstetrics and Gynecology

## 2012-05-29 DIAGNOSIS — Z01419 Encounter for gynecological examination (general) (routine) without abnormal findings: Secondary | ICD-10-CM | POA: Insufficient documentation

## 2012-05-29 DIAGNOSIS — Z1151 Encounter for screening for human papillomavirus (HPV): Secondary | ICD-10-CM | POA: Insufficient documentation

## 2012-05-29 DIAGNOSIS — N760 Acute vaginitis: Secondary | ICD-10-CM | POA: Insufficient documentation

## 2012-07-10 ENCOUNTER — Other Ambulatory Visit: Payer: Self-pay | Admitting: Nurse Practitioner

## 2012-07-10 ENCOUNTER — Other Ambulatory Visit (HOSPITAL_COMMUNITY)
Admission: RE | Admit: 2012-07-10 | Discharge: 2012-07-10 | Disposition: A | Discharge status: Home / Self Care | Payer: Managed Care, Other (non HMO) | Source: Ambulatory Visit | Attending: Obstetrics and Gynecology | Admitting: Obstetrics and Gynecology

## 2012-07-10 DIAGNOSIS — N760 Acute vaginitis: Secondary | ICD-10-CM | POA: Insufficient documentation

## 2012-07-10 DIAGNOSIS — Z113 Encounter for screening for infections with a predominantly sexual mode of transmission: Secondary | ICD-10-CM | POA: Insufficient documentation

## 2013-12-28 ENCOUNTER — Other Ambulatory Visit (HOSPITAL_COMMUNITY): Payer: Self-pay | Admitting: Obstetrics & Gynecology

## 2013-12-28 DIAGNOSIS — N979 Female infertility, unspecified: Secondary | ICD-10-CM

## 2014-01-04 ENCOUNTER — Ambulatory Visit (HOSPITAL_COMMUNITY)
Admission: RE | Admit: 2014-01-04 | Discharge: 2014-01-04 | Disposition: A | Discharge status: Home / Self Care | Payer: Managed Care, Other (non HMO) | Source: Ambulatory Visit | Attending: Obstetrics & Gynecology | Admitting: Obstetrics & Gynecology

## 2014-01-04 DIAGNOSIS — N979 Female infertility, unspecified: Secondary | ICD-10-CM | POA: Diagnosis present

## 2014-01-04 MED ORDER — IOHEXOL 300 MG/ML  SOLN
20.0000 mL | Freq: Once | INTRAMUSCULAR | Status: AC | PRN
  Administered 2014-01-04: 9 mL

## 2014-03-08 ENCOUNTER — Encounter: Payer: Self-pay | Admitting: Internal Medicine

## 2015-01-05 ENCOUNTER — Emergency Department (HOSPITAL_BASED_OUTPATIENT_CLINIC_OR_DEPARTMENT_OTHER)
Admission: EM | Admit: 2015-01-05 | Discharge: 2015-01-05 | Disposition: A | Discharge status: Home / Self Care | Payer: No Typology Code available for payment source | Attending: Emergency Medicine | Admitting: Emergency Medicine

## 2015-01-05 ENCOUNTER — Encounter (HOSPITAL_BASED_OUTPATIENT_CLINIC_OR_DEPARTMENT_OTHER): Payer: Self-pay | Admitting: *Deleted

## 2015-01-05 DIAGNOSIS — Z79899 Other long term (current) drug therapy: Secondary | ICD-10-CM | POA: Diagnosis not present

## 2015-01-05 DIAGNOSIS — S199XXA Unspecified injury of neck, initial encounter: Secondary | ICD-10-CM | POA: Diagnosis present

## 2015-01-05 DIAGNOSIS — S3992XA Unspecified injury of lower back, initial encounter: Secondary | ICD-10-CM | POA: Diagnosis not present

## 2015-01-05 DIAGNOSIS — T148 Other injury of unspecified body region: Secondary | ICD-10-CM | POA: Diagnosis not present

## 2015-01-05 DIAGNOSIS — Y92410 Unspecified street and highway as the place of occurrence of the external cause: Secondary | ICD-10-CM | POA: Diagnosis not present

## 2015-01-05 DIAGNOSIS — S3991XA Unspecified injury of abdomen, initial encounter: Secondary | ICD-10-CM | POA: Diagnosis not present

## 2015-01-05 DIAGNOSIS — Z86018 Personal history of other benign neoplasm: Secondary | ICD-10-CM | POA: Insufficient documentation

## 2015-01-05 DIAGNOSIS — T148XXA Other injury of unspecified body region, initial encounter: Secondary | ICD-10-CM

## 2015-01-05 DIAGNOSIS — S4990XA Unspecified injury of shoulder and upper arm, unspecified arm, initial encounter: Secondary | ICD-10-CM | POA: Insufficient documentation

## 2015-01-05 DIAGNOSIS — Y9389 Activity, other specified: Secondary | ICD-10-CM | POA: Insufficient documentation

## 2015-01-05 DIAGNOSIS — Y998 Other external cause status: Secondary | ICD-10-CM | POA: Insufficient documentation

## 2015-01-05 MED ORDER — METHOCARBAMOL 500 MG PO TABS: 1000.0000 mg | ORAL_TABLET | Freq: Four times a day (QID) | ORAL | Status: DC

## 2015-01-05 MED ORDER — NAPROXEN 500 MG PO TABS: 500.0000 mg | ORAL_TABLET | Freq: Two times a day (BID) | ORAL | Status: DC

## 2015-01-05 NOTE — ED Notes (Signed)
Patient preparing for discharge. 
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
## 2015-02-03 ENCOUNTER — Emergency Department (HOSPITAL_COMMUNITY)
Admission: EM | Admit: 2015-02-03 | Discharge: 2015-02-03 | Disposition: A | Discharge status: Home / Self Care | Payer: BLUE CROSS/BLUE SHIELD | Attending: Emergency Medicine | Admitting: Emergency Medicine

## 2015-02-03 ENCOUNTER — Encounter (HOSPITAL_COMMUNITY): Payer: Self-pay | Admitting: *Deleted

## 2015-02-03 DIAGNOSIS — S3992XA Unspecified injury of lower back, initial encounter: Secondary | ICD-10-CM | POA: Insufficient documentation

## 2015-02-03 DIAGNOSIS — Z86018 Personal history of other benign neoplasm: Secondary | ICD-10-CM | POA: Insufficient documentation

## 2015-02-03 DIAGNOSIS — Y9389 Activity, other specified: Secondary | ICD-10-CM | POA: Insufficient documentation

## 2015-02-03 DIAGNOSIS — S199XXA Unspecified injury of neck, initial encounter: Secondary | ICD-10-CM | POA: Diagnosis not present

## 2015-02-03 DIAGNOSIS — R519 Headache, unspecified: Secondary | ICD-10-CM

## 2015-02-03 DIAGNOSIS — M7918 Myalgia, other site: Secondary | ICD-10-CM

## 2015-02-03 DIAGNOSIS — S0990XA Unspecified injury of head, initial encounter: Secondary | ICD-10-CM | POA: Insufficient documentation

## 2015-02-03 DIAGNOSIS — Y998 Other external cause status: Secondary | ICD-10-CM | POA: Diagnosis not present

## 2015-02-03 DIAGNOSIS — Z79899 Other long term (current) drug therapy: Secondary | ICD-10-CM | POA: Diagnosis not present

## 2015-02-03 DIAGNOSIS — R51 Headache: Secondary | ICD-10-CM

## 2015-02-03 DIAGNOSIS — Y92410 Unspecified street and highway as the place of occurrence of the external cause: Secondary | ICD-10-CM | POA: Insufficient documentation

## 2015-02-03 MED ORDER — HYDROCODONE-ACETAMINOPHEN 5-325 MG PO TABS
1.0000 | ORAL_TABLET | Freq: Once | ORAL | Status: AC
  Administered 2015-02-03: 1 via ORAL
  Filled 2015-02-03: qty 1

## 2015-02-03 MED ORDER — IBUPROFEN 400 MG PO TABS
400.0000 mg | ORAL_TABLET | Freq: Once | ORAL | Status: AC
  Administered 2015-02-03: 400 mg via ORAL
  Filled 2015-02-03: qty 1

## 2015-02-03 MED ORDER — HYDROCODONE-ACETAMINOPHEN 5-325 MG PO TABS: ORAL_TABLET | ORAL | Status: DC
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
## 2015-02-03 NOTE — ED Notes (Signed)
PA at bedside.

## 2015-02-03 NOTE — Discharge Instructions (Signed)
For pain control please take ibuprofen (also known as Motrin or Advil) 800mg (this is normally 4 over the counter pills) 3 times a day  for 5 days. Take with food to minimize stomach irritation. ° °Take vicodin for breakthrough pain, do not drink alcohol, drive, care for children or do other critical tasks while taking vicodin. ° °Please follow with your primary care doctor in the next 2 days for a check-up. They must obtain records for further management.  ° °Do not hesitate to return to the Emergency Department for any new, worsening or concerning symptoms.  ° ° °
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
## 2015-10-04 DIAGNOSIS — Z319 Encounter for procreative management, unspecified: Secondary | ICD-10-CM | POA: Diagnosis not present

## 2015-10-04 DIAGNOSIS — Z31430 Encounter of female for testing for genetic disease carrier status for procreative management: Secondary | ICD-10-CM | POA: Diagnosis not present

## 2015-10-04 DIAGNOSIS — D251 Intramural leiomyoma of uterus: Secondary | ICD-10-CM | POA: Diagnosis not present

## 2015-10-04 DIAGNOSIS — Z13228 Encounter for screening for other metabolic disorders: Secondary | ICD-10-CM | POA: Diagnosis not present

## 2015-10-04 DIAGNOSIS — Z130 Encounter for screening for diseases of the blood and blood-forming organs and certain disorders involving the immune mechanism: Secondary | ICD-10-CM | POA: Diagnosis not present

## 2015-10-05 ENCOUNTER — Ambulatory Visit (INDEPENDENT_AMBULATORY_CARE_PROVIDER_SITE_OTHER): Payer: BLUE CROSS/BLUE SHIELD | Admitting: Family Medicine

## 2015-10-05 VITALS — BP 110/60 | HR 100 | Temp 98.5°F | Ht 61.5 in | Wt 141.0 lb

## 2015-10-05 DIAGNOSIS — J019 Acute sinusitis, unspecified: Secondary | ICD-10-CM | POA: Diagnosis not present

## 2015-10-05 DIAGNOSIS — J040 Acute laryngitis: Secondary | ICD-10-CM | POA: Diagnosis not present

## 2015-10-05 MED ORDER — HYDROCODONE-HOMATROPINE 5-1.5 MG/5ML PO SYRP: 5.0000 mL | ORAL_SOLUTION | Freq: Four times a day (QID) | ORAL | Status: AC | PRN

## 2015-10-05 MED ORDER — AMOXICILLIN-POT CLAVULANATE 875-125 MG PO TABS: 1.0000 | ORAL_TABLET | Freq: Two times a day (BID) | ORAL | Status: DC

## 2015-10-05 NOTE — Progress Notes (Signed)
Pre visit review using our clinic review tool, if applicable. No additional management support is needed unless otherwise documented below in the visit note. 

## 2015-10-05 NOTE — Patient Instructions (Signed)
Laryngitis  Laryngitis is inflammation of your vocal cords. This causes hoarseness, coughing, loss of voice, sore throat, or a dry throat. Your vocal cords are two bands of muscles that are found in your throat. When you speak, these cords come together and vibrate. These vibrations come out through your mouth as sound. When your vocal cords are inflamed, your voice sounds different.  Laryngitis can be temporary (acute) or long-term (chronic). Most cases of acute laryngitis improve with time. Chronic laryngitis is laryngitis that lasts for more than three weeks.  CAUSES  Acute laryngitis may be caused by:   A viral infection.   Lots of talking, yelling, or singing. This is also called vocal strain.   Bacterial infections.  Chronic laryngitis may be caused by:   Vocal strain.   Injury to your vocal cords.   Acid reflux (gastroesophageal reflux disease or GERD).   Allergies.   Sinus infection.   Smoking.   Alcohol abuse.   Breathing in chemicals or dust.   Growths on the vocal cords.  RISK FACTORS  Risk factors for laryngitis include:   Smoking.   Alcohol abuse.   Having allergies.  SIGNS AND SYMPTOMS  Symptoms of laryngitis may include:   Low, hoarse voice.   Loss of voice.   Dry cough.   Sore throat.   Stuffy nose.  DIAGNOSIS  Laryngitis may be diagnosed by:   Physical exam.   Throat culture.   Blood test.   Laryngoscopy. This procedure allows your health care provider to look at your vocal cords with a mirror or viewing tube.  TREATMENT  Treatment for laryngitis depends on what is causing it. Usually, treatment involves resting your voice and using medicines to soothe your throat. However, if your laryngitis is caused by a bacterial infection, you may need to take antibiotic medicine. If your laryngitis is caused by a growth, you may need to have a procedure to remove it.  HOME CARE INSTRUCTIONS   Drink enough fluid to keep your urine clear or pale yellow.   Breathe in moist air. Use a  humidifier if you live in a dry climate.   Take medicines only as directed by your health care provider.   If you were prescribed an antibiotic medicine, finish it all even if you start to feel better.   Do not smoke cigarettes or electronic cigarettes. If you need help quitting, ask your health care provider.   Talk as little as possible. Also avoid whispering, which can cause vocal strain.   Write instead of talking. Do this until your voice is back to normal.  SEEK MEDICAL CARE IF:   You have a fever.   You have increasing pain.   You have difficulty swallowing.  SEEK IMMEDIATE MEDICAL CARE IF:   You cough up blood.   You have trouble breathing.     This information is not intended to replace advice given to you by your health care provider. Make sure you discuss any questions you have with your health care provider.     Document Released: 04/23/2005 Document Revised: 05/14/2014 Document Reviewed: 10/06/2013  [employer] Interactive Patient Education 2016 [employer]

## 2015-10-05 NOTE — Progress Notes (Signed)
   Subjective:    Patient ID: Jessica Dudley, female    DOB: 06/03/1975, 40 y.o.   MRN: [id number]  HPI Patient seen with over one week history of sinusitis symptoms. She's had thick nasal discharge and frontal sinus pressure. Cough productive of green sputum. Cough fairly severe at night and interfering with sleep. Laryngitis symptoms past few days. Intermittent sore throat. Increased malaise. She's tried over-the-counter medications without much improvement. Patient is a nonsmoker.  Past Medical History  Diagnosis Date  . Headache(784.0)   . Urinary incontinence   . Fibroid    Past Surgical History  Procedure Laterality Date  . Cryotherapy      reports that she has never smoked. She does not have any smokeless tobacco history on file. She reports that she drinks alcohol. She reports that she does not use illicit drugs. family history includes Brain cancer in her cousin; Diabetes in her maternal grandmother; Hypertension in her mother; Leukemia in her cousin; Prostate cancer in her paternal uncle. No Known Allergies    Review of Systems  Constitutional: Positive for fatigue. Negative for fever and chills.  HENT: Positive for congestion, sinus pressure and voice change.   Respiratory: Positive for cough.        Objective:   Physical Exam  Constitutional: She appears well-developed and well-nourished.  HENT:  Right Ear: External ear normal.  Left Ear: External ear normal.  Mouth/Throat: Oropharynx is clear and moist.  Neck: Neck supple.  Cardiovascular: Normal rate and regular rhythm.   Pulmonary/Chest: Effort normal and breath sounds normal. No respiratory distress. She has no wheezes. She has no rales.  Lymphadenopathy:    She has no cervical adenopathy.          Assessment & Plan:  Acute sinusitis and laryngitis. We explained this may all be viral. We've recommended plenty of fluids and Mucinex. Hycodan cough syrup for severe nighttime cough as needed. We  printed prescription for Augmentin to start if she is not improving over the next few days and especially if she has any worsening symptoms  Eulas Post MD [facility]

## 2015-10-08 DIAGNOSIS — Z319 Encounter for procreative management, unspecified: Secondary | ICD-10-CM | POA: Diagnosis not present

## 2015-10-25 DIAGNOSIS — Z01419 Encounter for gynecological examination (general) (routine) without abnormal findings: Secondary | ICD-10-CM | POA: Diagnosis not present

## 2015-10-25 DIAGNOSIS — Z6826 Body mass index (BMI) 26.0-26.9, adult: Secondary | ICD-10-CM | POA: Diagnosis not present

## 2015-11-21 DIAGNOSIS — D251 Intramural leiomyoma of uterus: Secondary | ICD-10-CM | POA: Diagnosis not present

## 2015-11-21 DIAGNOSIS — N924 Excessive bleeding in the premenopausal period: Secondary | ICD-10-CM | POA: Diagnosis not present

## 2015-11-23 ENCOUNTER — Encounter (HOSPITAL_BASED_OUTPATIENT_CLINIC_OR_DEPARTMENT_OTHER): Payer: Self-pay | Admitting: *Deleted

## 2015-11-23 NOTE — Progress Notes (Signed)
NPO AFTER MN.  ARRIVE AT [id number].  NEEDS HG AND URINE PREG.  PRE-OP PENDING,  SPOKE W/ JENNIFER (OR SCHEDULER).

## 2015-11-28 NOTE — H&P (Signed)
Jessica Dudley is a 40 y.o. female , originally referred to me by Dr. Dellis Filbert, for myomectomy.  She was diagnosed with fibroids because of abnormal uterine bleeding.Transvaginal and transabdominal ultrasounds were done because of large size of the uterus.  4 myomas were noted as tabulated: Anterior intramural myoma, slightly distorting the endometrial cavity with measurements of 8.6 x 7.3 x 9.4 cm, fundal left-sided intramural myoma with measurements of 8.4 x 6.2 x 8.1 cm.  Patient would like to preserve her childbearing potential.  Pertinent Gynecological History: Menses: flow is excessive with use of 3 pads or tampons on heaviest days Bleeding: dysfunctional uterine bleeding Contraception: none DES exposure: denies Blood transfusions: none Sexually transmitted diseases: no past history Previous GYN Procedures: No  Last mammogram: normal Last pap: normal  OB History: G1P1   Menstrual History: Menarche age: 55 No LMP recorded.    Past Medical History:  Diagnosis Date  . Iron deficiency anemia   . Uterine fibroid   . Wears glasses                     Past Surgical History:  Procedure Laterality Date  . NO PAST SURGERIES               Family History  Problem Relation Age of Onset  . Hypertension Mother   . Prostate cancer Paternal Uncle   . Diabetes Maternal Grandmother   . Leukemia Cousin   . Brain cancer Cousin    No hereditary disease.  No cancer of breast, ovary, uterus. No cutaneous leiomyomatosis or renal cell carcinoma.  Social History   Social History  . Marital status: Single    Spouse name: N/A  . Number of children: N/A  . Years of education: N/A   Occupational History  . Not on file.   Social History Main Topics  . Smoking status: Never Smoker  . Smokeless tobacco: Never Used  . Alcohol use Yes     Comment: occasional  . Drug use: No  . Sexual activity: Yes    Birth control/ protection: None   Other Topics Concern  . 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    Current Outpatient Prescriptions on File Prior to Encounter  Medication Sig Dispense Refill  . MULTIPLE VITAMIN PO Take 1 tablet by mouth daily.        Review of Systems  Constitutional: Negative.   HENT: Negative.   Eyes: Negative.   Respiratory: Negative.   Cardiovascular: Negative.   Gastrointestinal: Negative.   Genitourinary: Negative.   Musculoskeletal: Negative.   Skin: Negative.   Neurological: Negative.   Endo/Heme/Allergies: Negative.   Psychiatric/Behavioral: Negative.      Physical Exam  Ht 5' 1.5" (1.562 m)   Wt 63.5 kg (140 lb)   LMP 11/11/2015 (Exact Date)   BMI 26.02 kg/m  Constitutional: She is oriented to person, place, and time. She appears well-developed and well-nourished.  HENT:  Head: Normocephalic and atraumatic.  Nose: Nose normal.  Mouth/Throat: Oropharynx is clear and moist. No oropharyngeal exudate.  Eyes: Conjunctivae normal and EOM are normal. Pupils are equal, round, and reactive to light. No  scleral icterus.  Neck: Normal range of motion. Neck supple. No tracheal deviation present. No thyromegaly present.  Cardiovascular: Normal rate.   Respiratory: Effort normal and breath sounds normal.  GI: Soft. Bowel sounds are normal. She exhibits no distension and no mass. There is no tenderness.  Lymphadenopathy:    She has no cervical adenopathy.  Neurological: She is alert and oriented to person, place, and time. She has normal reflexes.  Skin: Skin is warm.  Psychiatric: She has a normal mood and affect. Her behavior is normal. Judgment and thought content normal.       Assessment/Plan:  Intramural and subserosal uterine myomas, causing menorrhagia and pressure sensation. Preoperative  for abdominal myomectomy Benefits and risks of abdominal myomectomy were discussed with the patient and her family member again.  Bowel prep instructions were given.  All of patient's questions were answered.  She verbalized understanding.  She knows that she will need a cesarean delivery for future pregnancies, and that it is recommended she does not conceive for 2-3 months for uterus to heal.

## 2015-11-29 ENCOUNTER — Encounter (HOSPITAL_BASED_OUTPATIENT_CLINIC_OR_DEPARTMENT_OTHER): Payer: Self-pay | Admitting: Anesthesiology

## 2015-11-29 ENCOUNTER — Encounter (HOSPITAL_COMMUNITY)
Admission: RE | Disposition: A | Discharge status: Home / Self Care | Payer: Self-pay | Source: Ambulatory Visit | Attending: Obstetrics and Gynecology

## 2015-11-29 ENCOUNTER — Ambulatory Visit (HOSPITAL_BASED_OUTPATIENT_CLINIC_OR_DEPARTMENT_OTHER): Payer: BLUE CROSS/BLUE SHIELD | Admitting: Anesthesiology

## 2015-11-29 ENCOUNTER — Observation Stay (HOSPITAL_BASED_OUTPATIENT_CLINIC_OR_DEPARTMENT_OTHER)
Admission: RE | Admit: 2015-11-29 | Discharge: 2015-11-30 | Disposition: A | Discharge status: Home / Self Care | Payer: BLUE CROSS/BLUE SHIELD | Source: Ambulatory Visit | Attending: Obstetrics and Gynecology | Admitting: Obstetrics and Gynecology

## 2015-11-29 DIAGNOSIS — D251 Intramural leiomyoma of uterus: Secondary | ICD-10-CM | POA: Diagnosis not present

## 2015-11-29 DIAGNOSIS — D252 Subserosal leiomyoma of uterus: Secondary | ICD-10-CM | POA: Diagnosis not present

## 2015-11-29 DIAGNOSIS — D219 Benign neoplasm of connective and other soft tissue, unspecified: Secondary | ICD-10-CM | POA: Diagnosis present

## 2015-11-29 DIAGNOSIS — N8311 Corpus luteum cyst of right ovary: Secondary | ICD-10-CM | POA: Insufficient documentation

## 2015-11-29 DIAGNOSIS — Z79899 Other long term (current) drug therapy: Secondary | ICD-10-CM | POA: Diagnosis not present

## 2015-11-29 DIAGNOSIS — N924 Excessive bleeding in the premenopausal period: Secondary | ICD-10-CM | POA: Diagnosis not present

## 2015-11-29 DIAGNOSIS — D259 Leiomyoma of uterus, unspecified: Secondary | ICD-10-CM | POA: Diagnosis not present

## 2015-11-29 DIAGNOSIS — D509 Iron deficiency anemia, unspecified: Secondary | ICD-10-CM | POA: Insufficient documentation

## 2015-11-29 HISTORY — PX: CHROMOPERTUBATION: SHX6288

## 2015-11-29 HISTORY — DX: Iron deficiency anemia, unspecified: D50.9

## 2015-11-29 HISTORY — DX: Presence of spectacles and contact lenses: Z97.3

## 2015-11-29 HISTORY — DX: Leiomyoma of uterus, unspecified: D25.9

## 2015-11-29 LAB — POCT PREGNANCY, URINE: Preg Test, Ur: NEGATIVE

## 2015-11-29 LAB — POCT HEMOGLOBIN-HEMACUE: Hemoglobin: 12.4 g/dL (ref 12.0–15.0)

## 2015-11-29 SURGERY — CHROMOPERTUBATION, FALLOPIAN TUBE
Anesthesia: General | Site: Abdomen

## 2015-11-29 MED ORDER — KETOROLAC TROMETHAMINE 30 MG/ML IJ SOLN
INTRAMUSCULAR | Status: AC
  Filled 2015-11-29: qty 1

## 2015-11-29 MED ORDER — LIDOCAINE HCL (CARDIAC) 20 MG/ML IV SOLN
INTRAVENOUS | Status: AC
  Filled 2015-11-29: qty 5

## 2015-11-29 MED ORDER — HYDROCODONE-ACETAMINOPHEN 5-325 MG PO TABS
1.0000 | ORAL_TABLET | ORAL | Status: DC | PRN
  Filled 2015-11-29: qty 2

## 2015-11-29 MED ORDER — METOCLOPRAMIDE HCL 5 MG/ML IJ SOLN
10.0000 mg | Freq: Once | INTRAMUSCULAR | Status: DC | PRN
  Filled 2015-11-29: qty 2

## 2015-11-29 MED ORDER — ONDANSETRON HCL 4 MG/2ML IJ SOLN
4.0000 mg | Freq: Four times a day (QID) | INTRAMUSCULAR | Status: DC | PRN
  Administered 2015-11-29: 4 mg via INTRAVENOUS
  Filled 2015-11-29: qty 2

## 2015-11-29 MED ORDER — MEPERIDINE HCL 25 MG/ML IJ SOLN
6.2500 mg | INTRAMUSCULAR | Status: DC | PRN
  Filled 2015-11-29: qty 1

## 2015-11-29 MED ORDER — PROPOFOL 10 MG/ML IV BOLUS
INTRAVENOUS | Status: DC | PRN
  Administered 2015-11-29: 180 mg via INTRAVENOUS

## 2015-11-29 MED ORDER — HYDROMORPHONE HCL 1 MG/ML IJ SOLN
0.2500 mg | INTRAMUSCULAR | Status: DC | PRN
  Administered 2015-11-29: 0.25 mg via INTRAVENOUS
  Filled 2015-11-29: qty 1

## 2015-11-29 MED ORDER — HYDROMORPHONE HCL 4 MG/ML IJ SOLN
INTRAMUSCULAR | Status: AC
  Filled 2015-11-29: qty 1

## 2015-11-29 MED ORDER — PROPOFOL 10 MG/ML IV BOLUS
INTRAVENOUS | Status: AC
  Filled 2015-11-29: qty 20

## 2015-11-29 MED ORDER — FENTANYL CITRATE (PF) 100 MCG/2ML IJ SOLN
INTRAMUSCULAR | Status: DC | PRN
  Administered 2015-11-29: 50 ug via INTRAVENOUS
  Administered 2015-11-29: 150 ug via INTRAVENOUS
  Administered 2015-11-29: 50 ug via INTRAVENOUS

## 2015-11-29 MED ORDER — ONDANSETRON HCL 4 MG PO TABS: 4.0000 mg | ORAL_TABLET | Freq: Four times a day (QID) | ORAL | Status: DC | PRN

## 2015-11-29 MED ORDER — DEXAMETHASONE SODIUM PHOSPHATE 4 MG/ML IJ SOLN
INTRAMUSCULAR | Status: DC | PRN
  Administered 2015-11-29: 10 mg via INTRAVENOUS

## 2015-11-29 MED ORDER — ONDANSETRON HCL 4 MG/2ML IJ SOLN
INTRAMUSCULAR | Status: DC | PRN
  Administered 2015-11-29: 4 mg via INTRAVENOUS

## 2015-11-29 MED ORDER — SUGAMMADEX SODIUM 200 MG/2ML IV SOLN
INTRAVENOUS | Status: AC
  Filled 2015-11-29: qty 2

## 2015-11-29 MED ORDER — HYDROMORPHONE HCL 1 MG/ML IJ SOLN
INTRAMUSCULAR | Status: AC
  Filled 2015-11-29: qty 1

## 2015-11-29 MED ORDER — MIDAZOLAM HCL 5 MG/5ML IJ SOLN
INTRAMUSCULAR | Status: DC | PRN
  Administered 2015-11-29: 2 mg via INTRAVENOUS

## 2015-11-29 MED ORDER — SODIUM CHLORIDE 0.9 % IJ SOLN
INTRAMUSCULAR | Status: DC | PRN
  Administered 2015-11-29: 100 mL

## 2015-11-29 MED ORDER — KETOROLAC TROMETHAMINE 30 MG/ML IJ SOLN
INTRAMUSCULAR | Status: DC | PRN
  Administered 2015-11-29: 30 mg via INTRAVENOUS

## 2015-11-29 MED ORDER — PROMETHAZINE HCL 25 MG/ML IJ SOLN
25.0000 mg | Freq: Three times a day (TID) | INTRAMUSCULAR | Status: DC | PRN
  Administered 2015-11-29: 25 mg via INTRAVENOUS
  Filled 2015-11-29 (×2): qty 1

## 2015-11-29 MED ORDER — SODIUM CHLORIDE 0.9 % IR SOLN
Status: DC | PRN
  Administered 2015-11-29: 500 mL

## 2015-11-29 MED ORDER — SUGAMMADEX SODIUM 200 MG/2ML IV SOLN
INTRAVENOUS | Status: DC | PRN
  Administered 2015-11-29: 200 mg via INTRAVENOUS

## 2015-11-29 MED ORDER — CEFAZOLIN SODIUM-DEXTROSE 2-4 GM/100ML-% IV SOLN
INTRAVENOUS | Status: AC
  Filled 2015-11-29: qty 100

## 2015-11-29 MED ORDER — ROCURONIUM BROMIDE 100 MG/10ML IV SOLN
INTRAVENOUS | Status: DC | PRN
  Administered 2015-11-29: 5 mg via INTRAVENOUS
  Administered 2015-11-29: 10 mg via INTRAVENOUS
  Administered 2015-11-29: 40 mg via INTRAVENOUS

## 2015-11-29 MED ORDER — POTASSIUM CHLORIDE IN NACL 20-0.45 MEQ/L-% IV SOLN
INTRAVENOUS | Status: DC
  Administered 2015-11-30: 1000 mL via INTRAVENOUS
  Filled 2015-11-29 (×2): qty 1000

## 2015-11-29 MED ORDER — VASOPRESSIN 20 UNIT/ML IV SOLN
INTRAVENOUS | Status: DC | PRN
  Administered 2015-11-29: 10 mL via INTRAMUSCULAR
  Administered 2015-11-29: 18 mL via INTRAMUSCULAR
  Administered 2015-11-29: 10 mL via INTRAMUSCULAR

## 2015-11-29 MED ORDER — IBUPROFEN 800 MG PO TABS
800.0000 mg | ORAL_TABLET | Freq: Three times a day (TID) | ORAL | Status: DC | PRN
  Administered 2015-11-29 – 2015-11-30 (×3): 800 mg via ORAL
  Filled 2015-11-29 (×3): qty 1

## 2015-11-29 MED ORDER — CEFAZOLIN SODIUM-DEXTROSE 2-4 GM/100ML-% IV SOLN
2.0000 g | INTRAVENOUS | Status: AC
  Administered 2015-11-29: 2 g via INTRAVENOUS
  Filled 2015-11-29: qty 100

## 2015-11-29 MED ORDER — MIDAZOLAM HCL 2 MG/2ML IJ SOLN
INTRAMUSCULAR | Status: AC
  Filled 2015-11-29: qty 2

## 2015-11-29 MED ORDER — HYDROCODONE-ACETAMINOPHEN 7.5-325 MG PO TABS
1.0000 | ORAL_TABLET | Freq: Once | ORAL | Status: DC | PRN
  Filled 2015-11-29: qty 1

## 2015-11-29 MED ORDER — FENTANYL CITRATE (PF) 250 MCG/5ML IJ SOLN
INTRAMUSCULAR | Status: AC
  Filled 2015-11-29: qty 5

## 2015-11-29 MED ORDER — DEXAMETHASONE SODIUM PHOSPHATE 10 MG/ML IJ SOLN
INTRAMUSCULAR | Status: AC
  Filled 2015-11-29: qty 1

## 2015-11-29 MED ORDER — MORPHINE SULFATE (PF) 2 MG/ML IV SOLN
1.0000 mg | INTRAVENOUS | Status: DC | PRN
  Administered 2015-11-29 (×2): 2 mg via INTRAVENOUS
  Filled 2015-11-29 (×2): qty 1

## 2015-11-29 MED ORDER — LIDOCAINE HCL (CARDIAC) 20 MG/ML IV SOLN
INTRAVENOUS | Status: DC | PRN
  Administered 2015-11-29: 60 mg via INTRAVENOUS

## 2015-11-29 MED ORDER — LACTATED RINGERS IV SOLN
INTRAVENOUS | Status: DC
  Administered 2015-11-29 (×4): via INTRAVENOUS
  Filled 2015-11-29: qty 1000

## 2015-11-29 MED ORDER — HYDROMORPHONE HCL 1 MG/ML IJ SOLN
INTRAMUSCULAR | Status: DC | PRN
  Administered 2015-11-29 (×2): 1 mg via INTRAVENOUS

## 2015-11-29 MED ORDER — METHYLENE BLUE 0.5 % INJ SOLN
INTRAVENOUS | Status: DC | PRN
  Administered 2015-11-29: 1 mL
  Administered 2015-11-29: 20 mL

## 2015-11-29 MED ORDER — BUPIVACAINE-EPINEPHRINE 0.5% -1:200000 IJ SOLN
INTRAMUSCULAR | Status: DC | PRN
  Administered 2015-11-29: 10 mL

## 2015-11-29 MED ORDER — PHENYLEPHRINE 40 MCG/ML (10ML) SYRINGE FOR IV PUSH (FOR BLOOD PRESSURE SUPPORT)
PREFILLED_SYRINGE | INTRAVENOUS | Status: AC
  Filled 2015-11-29: qty 20

## 2015-11-29 MED ORDER — ROCURONIUM BROMIDE 100 MG/10ML IV SOLN
INTRAVENOUS | Status: AC
  Filled 2015-11-29: qty 1

## 2015-11-29 MED ORDER — ONDANSETRON HCL 4 MG/2ML IJ SOLN
INTRAMUSCULAR | Status: AC
  Filled 2015-11-29: qty 2

## 2015-11-29 SURGICAL SUPPLY — 66 items
BARRIER ADHS 3X4 INTERCEED (GAUZE/BANDAGES/DRESSINGS) IMPLANT
BLADE HEX COATED 2.75 (ELECTRODE) ×3 IMPLANT
BRR ADH 4X3 ABS CNTRL BYND (GAUZE/BANDAGES/DRESSINGS)
BRR ADH 6X5 SEPRAFILM 1 SHT (MISCELLANEOUS) ×4
CANISTER SUCT 3000ML (MISCELLANEOUS) ×3 IMPLANT
CATH FOLEY 2WAY  3CC  8FR (CATHETERS) ×1
CATH FOLEY 2WAY 3CC 8FR (CATHETERS) ×1 IMPLANT
CELLS DAT CNTRL 66122 CELL SVR (MISCELLANEOUS) ×2 IMPLANT
CHLORAPREP W/TINT 26ML (MISCELLANEOUS) ×3 IMPLANT
COVER BACK TABLE 60X90IN (DRAPES) ×3 IMPLANT
COVER MAYO STAND STRL (DRAPES) ×3 IMPLANT
DECANTER SPIKE VIAL GLASS SM (MISCELLANEOUS) IMPLANT
DRAPE LAPAROTOMY T 102X78X121 (DRAPES) ×3 IMPLANT
DRAPE WARM FLUID 44X44 (DRAPE) ×3 IMPLANT
DRESSING TELFA ISLAND 4X8 (GAUZE/BANDAGES/DRESSINGS) ×3 IMPLANT
DRSG OPSITE POSTOP 3X4 (GAUZE/BANDAGES/DRESSINGS) ×2 IMPLANT
ELECT BLADE 6.5 .24CM SHAFT (ELECTRODE) IMPLANT
ELECT REM PT RETURN 9FT ADLT (ELECTROSURGICAL) ×3
ELECTRODE REM PT RTRN 9FT ADLT (ELECTROSURGICAL) ×2 IMPLANT
GLOVE BIO SURGEON STRL SZ8 (GLOVE) ×8 IMPLANT
GLOVE INDICATOR 8.5 STRL (GLOVE) ×6 IMPLANT
GOWN STRL REUS W/ TWL XL LVL3 (GOWN DISPOSABLE) ×4 IMPLANT
GOWN STRL REUS W/TWL LRG LVL3 (GOWN DISPOSABLE) ×2 IMPLANT
GOWN STRL REUS W/TWL XL LVL3 (GOWN DISPOSABLE) ×3
HOLDER FOLEY CATH W/STRAP (MISCELLANEOUS) ×3 IMPLANT
KIT ROOM TURNOVER WOR (KITS) ×3 IMPLANT
MANIPULATOR UTERINE 4.5 ZUMI (MISCELLANEOUS) IMPLANT
NDL HYPO 18GX1.5 BLUNT FILL (NEEDLE) IMPLANT
NDL HYPO 25X1 1.5 SAFETY (NEEDLE) ×1 IMPLANT
NEEDLE HYPO 18GX1.5 BLUNT FILL (NEEDLE) IMPLANT
NEEDLE HYPO 25X1 1.5 SAFETY (NEEDLE) ×6 IMPLANT
NS IRRIG 500ML POUR BTL (IV SOLUTION) ×3 IMPLANT
PAD OB MATERNITY 4.3X12.25 (PERSONAL CARE ITEMS) ×3 IMPLANT
PENCIL BUTTON HOLSTER BLD 10FT (ELECTRODE) ×3 IMPLANT
RETRACTOR WND ALEXIS 18 MED (MISCELLANEOUS) IMPLANT
RTRCTR WOUND ALEXIS 18CM MED (MISCELLANEOUS) ×3
SEPRAFILM MEMBRANE 5X6 (MISCELLANEOUS) ×4 IMPLANT
SPONGE LAP 18X18 X RAY DECT (DISPOSABLE) ×3 IMPLANT
SPONGE LAP 4X18 X RAY DECT (DISPOSABLE) IMPLANT
STRIP CLOSURE SKIN 1/4X4 (GAUZE/BANDAGES/DRESSINGS) IMPLANT
SUT MNCRL AB 4-0 PS2 18 (SUTURE) ×3 IMPLANT
SUT MON AB 2-0 CT1 36 (SUTURE) IMPLANT
SUT PDS AB 0 CT1 27 (SUTURE) IMPLANT
SUT VIC AB 0 CT1 18XCR BRD8 (SUTURE) IMPLANT
SUT VIC AB 0 CT1 27 (SUTURE)
SUT VIC AB 0 CT1 27XBRD ANBCTR (SUTURE) IMPLANT
SUT VIC AB 0 CT1 8-18 (SUTURE)
SUT VIC AB 2-0 CT1 (SUTURE) ×10 IMPLANT
SUT VIC AB 2-0 CT1 27 (SUTURE)
SUT VIC AB 2-0 CT1 TAPERPNT 27 (SUTURE) IMPLANT
SUT VIC AB 2-0 CT2 27 (SUTURE) IMPLANT
SUT VIC AB 2-0 UR6 27 (SUTURE) IMPLANT
SUT VIC AB 3-0 CT1 27 (SUTURE)
SUT VIC AB 3-0 CT1 TAPERPNT 27 (SUTURE) IMPLANT
SUT VIC AB 4-0 SH 27 (SUTURE) ×9
SUT VIC AB 4-0 SH 27XANBCTRL (SUTURE) ×3 IMPLANT
SUT VICRYL 0 TIES 12 18 (SUTURE) ×3 IMPLANT
SYR CONTROL 10ML LL (SYRINGE) ×7 IMPLANT
SYRINGE 10CC LL (SYRINGE) ×8 IMPLANT
SYRINGE 60CC LL (MISCELLANEOUS) ×2 IMPLANT
TOWEL OR 17X24 6PK STRL BLUE (TOWEL DISPOSABLE) ×6 IMPLANT
TRAY DSU PREP LF (CUSTOM PROCEDURE TRAY) ×3 IMPLANT
TRAY FOLEY CATH SILVER 14FR (SET/KITS/TRAYS/PACK) ×2 IMPLANT
TUBE CONNECTING 12X1/4 (SUCTIONS) ×3 IMPLANT
WATER STERILE IRR 500ML POUR (IV SOLUTION) ×3 IMPLANT
YANKAUER SUCT BULB TIP NO VENT (SUCTIONS) ×3 IMPLANT

## 2015-11-29 NOTE — Anesthesia Preprocedure Evaluation (Addendum)
Anesthesia Evaluation  Patient identified by MRN, date of birth, ID band Patient awake    Reviewed: Allergy & Precautions, NPO status , Patient's Chart, lab work & pertinent test results  Airway Mallampati: I  TM Distance: >3 FB Neck ROM: Full    Dental no notable dental hx. (+) Teeth Intact   Pulmonary neg pulmonary ROS,    Pulmonary exam normal breath sounds clear to auscultation       Cardiovascular Exercise Tolerance: Good negative cardio ROS Normal cardiovascular exam Rhythm:Regular Rate:Normal     Neuro/Psych  Headaches, negative psych ROS   GI/Hepatic negative GI ROS, Neg liver ROS,   Endo/Other    Renal/GU negative Renal ROS  negative genitourinary   Musculoskeletal negative musculoskeletal ROS (+)   Abdominal   Peds  Hematology negative hematology ROS (+) anemia ,   Anesthesia Other Findings   Reproductive/Obstetrics Uterine fibroids                            Anesthesia Physical Anesthesia Plan  ASA: I  Anesthesia Plan: General   Post-op Pain Management:    Induction: Intravenous  Airway Management Planned: Oral ETT  Additional Equipment:   Intra-op Plan:   Post-operative Plan: Extubation in OR  Informed Consent: I have reviewed the patients History and Physical, chart, labs and discussed the procedure including the risks, benefits and alternatives for the proposed anesthesia with the patient or authorized representative who has indicated his/her understanding and acceptance.   Dental advisory given  Plan Discussed with: Surgeon, Anesthesiologist and CRNA  Anesthesia Plan Comments:         Anesthesia Quick Evaluation

## 2015-11-29 NOTE — Anesthesia Postprocedure Evaluation (Signed)
Anesthesia Post Note  Patient: JENN HIBBITTS  Procedure(s) Performed: Procedure(s) (LRB): ABDOMINAL MYOMECTOMY (N/A)  Patient location during evaluation: PACU Anesthesia Type: General Level of consciousness: awake and alert and oriented Pain management: pain level controlled Vital Signs Assessment: post-procedure vital signs reviewed and stable Respiratory status: spontaneous breathing, nonlabored ventilation and respiratory function stable Cardiovascular status: blood pressure returned to baseline and stable Postop Assessment: no signs of nausea or vomiting Anesthetic complications: no    Last Vitals:  Vitals:   11/29/15 1315 11/29/15 1330  BP: 117/71 120/68  Pulse: 99 100  Resp: 12 15  Temp:      Last Pain:  Vitals:   11/29/15 0857  TempSrc: Oral                 FOSTER,MICHAEL A.

## 2015-11-29 NOTE — Anesthesia Procedure Notes (Signed)
Procedure Name: Intubation Date/Time: 11/29/2015 10:20 AM Performed by: Wanita Chamberlain Pre-anesthesia Checklist: Patient identified, Timeout performed, Emergency Drugs available, Suction available and Patient being monitored Patient Re-evaluated:Patient Re-evaluated prior to inductionOxygen Delivery Method: Circle system utilized Preoxygenation: Pre-oxygenation with 100% oxygen Intubation Type: IV induction Ventilation: Mask ventilation without difficulty Laryngoscope Size: Mac and 3 Grade View: Grade I Tube type: Oral Number of attempts: 1 Airway Equipment and Method: Stylet Placement Confirmation: positive ETCO2,  CO2 detector and breath sounds checked- equal and bilateral Secured at: 21 cm Tube secured with: Tape Dental Injury: Teeth and Oropharynx as per pre-operative assessment

## 2015-11-29 NOTE — Op Note (Signed)
OPERATIVE NOTE  Preoperative diagnosis: Uterine leiomyomas, menorrhagia  Postoperative diagnosis: Uterine leiomyomas, menorrhagia  Procedure: Laparotomy, abdominal myomectomy, chromotubation  Anesthesia: Gen. Endotracheal  Surgeon:YALCINKAYA,TAMER   Asst.: Surgical technician  Complications: None   Estimated blood loss: 300 mL  Specimens: Uterine myomas to pathology   Findings: On exam under anesthesia the external genitalia, Bartholin's, Skene's, urethra were normal. The vagina was normal. The cervix appeared grossly normal. This was enlarged to [redacted] week gestational size with firm and nodular myomas. It was at the level of the fundus. There were no posterior fornix nodularities  On laparotomy the uterus was enlarged with multiple intramural and subserosal myomas. There was a 9 x 7 x 9 cm anterior intramural myoma. 8 x 6 x 8 cm left fundal myoma, a posterior right-sided intramural myoma 3 x 3 cm. In addition there were also 2 x 2 centimeter intramural myomas in deep myometrium in posterior and anterior wall of the uterus and they were also removed. The largest anterior intramural myoma was immediately adjacent to the endometrium.  Both fallopian tubes appeared normal proximally and distally with fimbria rated as 5 out of 5. Both ovaries appeared grossly normal. There was a corpus luteum in the right ovary. The small size of the incision did not allow [place] to inspect the posterior cul-de-sac peritoneum but there were no visible lesions of endometriosis.   Description of the procedure: Patient was placed in dorsal supine position and general endotracheal anesthesia was given. 2 g of cefazolin were given intravenously for prophylaxis. She was then placed in frog-leg position and the abdomen and the perineum and vagina were prepped. A Foley catheter was inserted into the bladder. A vaginal speculum was inserted and the cervix was grasped with a tenaculum. A pediatric Foley catheter was inserted into  the uterus and its balloon was inflated. This was connected to a syringe containing diluted methylene blue solution which was then taped to the patient's anterior thigh. This was used for chromotubation during the procedure to define the endometrium.  She was placed in dorsal supine position and draped inside manner. A 5 cm Pfannenstiel incision was made after preemptive anesthesia with 0.5% bupivacaine with 1 200,000 epinephrine. the incision was carried down to fascia . The fascia was nicked and incised in transverse elliptical manner. The fascia was then separated at midline from the underlying rectus muscles. The rectus muscles were separated at midline by blunt dissection. The peritoneum was elevated nicked and incised in vertical manner. A self-retaining protractor retractor was placed into the incision. A dilute vasopressin solution (0.4 units per mL) was injected into the myomectomy sites. First we made a anterior oblique incision over the largest intramural myoma measuring 9 cm. This was dissected by blunt and sharp dissection and an underlying small intramural myoma was also enucleated. We noted that the endometrium was immediately adjacent to this myoma. The endometrium was not entered. Another incision was made over the large left fundal myoma in near vertical direction and the fibroid was similarly dissected by blunt and sharp dissection. Next we made a posterior incision over the right posterior intramural myoma measuring 3 x 3 cm and also a 2 x 1.5 cm deep intramural myoma was found and this was enucleated as well.  The resulting myomectomy defects were closed in 3-4 layers with 2-0 Vicryl continuous suture. The first layer was a continuous interlocking suture the second layer was a superficial myometrial continuous suture of 2-0 Vicryl. The serosa was closed with 4-0 Vicryl continuous  suture.  The pelvis was copiously irrigated with warm saline and aspirated. A slurry of 2 sheets of Seprafilm  in 60 mL of saline was instilled via a red rubber catheter at the end of the procedure into the pelvis, where it should act as an adhesion barrier.  Hemostasis was insured. The lap pad count and instrument count were correct. The rectus sheath was closed with 2-0 Vicryl continuous suture. The subcutaneous tissue was irrigated and hemostasis was checked. The skin was closed with 4-0 Monocryl in subcuticular stitch.  The patient tolerated the procedure well and was transferred to recovery room in satisfactory condition.   Special Note: Because of the extent of the myometrial incisions, it is recommended that the patient the liver via cesarean section with future pregnancies.   Governor Specking

## 2015-11-29 NOTE — Transfer of Care (Signed)
Immediate Anesthesia Transfer of Care Note  Patient: Jessica Dudley  Procedure(s) Performed: Procedure(s): ABDOMINAL MYOMECTOMY (N/A)  Patient Location: PACU  Anesthesia Type:General  Level of Consciousness: awake, alert , oriented and patient cooperative  Airway & Oxygen Therapy: Patient Spontanous Breathing and Patient connected to nasal cannula oxygen  Post-op Assessment: Report given to RN and Post -op Vital signs reviewed and stable  Post vital signs: Reviewed and stable  Last Vitals:  Vitals:   11/29/15 1242 11/29/15 1245  BP: 115/80 115/80  Pulse: 96 93  Resp: (!) 9 10  Temp: 36.9 C     Last Pain:  Vitals:   11/29/15 0857  TempSrc: Oral      Patients Stated Pain Goal: 7 ([id number])  Complications: No apparent anesthesia complications

## 2015-11-29 NOTE — Anesthesia Procedure Notes (Signed)
Performed by: SAUVE, ROBIN F       

## 2015-11-30 ENCOUNTER — Encounter (HOSPITAL_BASED_OUTPATIENT_CLINIC_OR_DEPARTMENT_OTHER): Payer: Self-pay | Admitting: Obstetrics and Gynecology

## 2015-11-30 DIAGNOSIS — N8311 Corpus luteum cyst of right ovary: Secondary | ICD-10-CM | POA: Diagnosis not present

## 2015-11-30 DIAGNOSIS — D252 Subserosal leiomyoma of uterus: Secondary | ICD-10-CM | POA: Diagnosis not present

## 2015-11-30 DIAGNOSIS — D509 Iron deficiency anemia, unspecified: Secondary | ICD-10-CM | POA: Diagnosis not present

## 2015-11-30 DIAGNOSIS — D251 Intramural leiomyoma of uterus: Secondary | ICD-10-CM | POA: Diagnosis not present

## 2015-11-30 DIAGNOSIS — Z79899 Other long term (current) drug therapy: Secondary | ICD-10-CM | POA: Diagnosis not present

## 2015-11-30 LAB — CBC
HCT: 29.8 % — ABNORMAL LOW (ref 36.0–46.0)
Hemoglobin: 9.5 g/dL — ABNORMAL LOW (ref 12.0–15.0)
MCH: 20.8 pg — ABNORMAL LOW (ref 26.0–34.0)
MCHC: 31.9 g/dL (ref 30.0–36.0)
MCV: 65.4 fL — ABNORMAL LOW (ref 78.0–100.0)
Platelets: 158 10*3/uL (ref 150–400)
RBC: 4.56 MIL/uL (ref 3.87–5.11)
RDW: 18.1 % — ABNORMAL HIGH (ref 11.5–15.5)
WBC: 10.3 10*3/uL (ref 4.0–10.5)

## 2015-11-30 MED ORDER — HYDROCODONE-ACETAMINOPHEN 5-325 MG PO TABS: 1.0000 | ORAL_TABLET | ORAL | 0 refills | Status: DC | PRN

## 2015-11-30 MED ORDER — IBUPROFEN 800 MG PO TABS: 800.0000 mg | ORAL_TABLET | Freq: Three times a day (TID) | ORAL | 0 refills | Status: DC | PRN

## 2015-11-30 MED ORDER — ONDANSETRON HCL 4 MG PO TABS: 4.0000 mg | ORAL_TABLET | Freq: Four times a day (QID) | ORAL | 0 refills | Status: DC | PRN

## 2015-12-10 ENCOUNTER — Emergency Department (HOSPITAL_COMMUNITY): Payer: BLUE CROSS/BLUE SHIELD

## 2015-12-10 ENCOUNTER — Observation Stay (HOSPITAL_COMMUNITY)
Admission: EM | Admit: 2015-12-10 | Discharge: 2015-12-12 | Disposition: A | Discharge status: Home / Self Care | Payer: BLUE CROSS/BLUE SHIELD | Attending: Internal Medicine | Admitting: Internal Medicine

## 2015-12-10 ENCOUNTER — Encounter (HOSPITAL_COMMUNITY): Payer: Self-pay | Admitting: Emergency Medicine

## 2015-12-10 DIAGNOSIS — R1011 Right upper quadrant pain: Secondary | ICD-10-CM | POA: Diagnosis not present

## 2015-12-10 DIAGNOSIS — R079 Chest pain, unspecified: Secondary | ICD-10-CM | POA: Diagnosis not present

## 2015-12-10 DIAGNOSIS — R0781 Pleurodynia: Secondary | ICD-10-CM

## 2015-12-10 DIAGNOSIS — I2699 Other pulmonary embolism without acute cor pulmonale: Secondary | ICD-10-CM | POA: Diagnosis not present

## 2015-12-10 DIAGNOSIS — D509 Iron deficiency anemia, unspecified: Secondary | ICD-10-CM | POA: Diagnosis present

## 2015-12-10 DIAGNOSIS — R0602 Shortness of breath: Secondary | ICD-10-CM | POA: Diagnosis not present

## 2015-12-10 DIAGNOSIS — R0789 Other chest pain: Secondary | ICD-10-CM | POA: Diagnosis not present

## 2015-12-10 LAB — COMPREHENSIVE METABOLIC PANEL
ALT: 15 U/L (ref 14–54)
AST: 14 U/L — ABNORMAL LOW (ref 15–41)
Albumin: 3.8 g/dL (ref 3.5–5.0)
Alkaline Phosphatase: 45 U/L (ref 38–126)
Anion gap: 8 (ref 5–15)
BUN: 15 mg/dL (ref 6–20)
CO2: 25 mmol/L (ref 22–32)
Calcium: 9.8 mg/dL (ref 8.9–10.3)
Chloride: 106 mmol/L (ref 101–111)
Creatinine, Ser: 0.82 mg/dL (ref 0.44–1.00)
GFR calc Af Amer: >60 mL/min (ref >60)
GFR calc non Af Amer: >60 mL/min (ref >60)
Glucose, Bld: 99 mg/dL (ref 65–99)
Potassium: 3.8 mmol/L (ref 3.5–5.1)
Sodium: 139 mmol/L (ref 135–145)
Total Bilirubin: 0.5 mg/dL (ref 0.3–1.2)
Total Protein: 7.1 g/dL (ref 6.5–8.1)

## 2015-12-10 LAB — URINE MICROSCOPIC-ADD ON: WBC, UA: NONE SEEN WBC/hpf (ref 0–5)

## 2015-12-10 LAB — URINALYSIS, ROUTINE W REFLEX MICROSCOPIC
Bilirubin Urine: NEGATIVE
Glucose, UA: NEGATIVE mg/dL
Ketones, ur: NEGATIVE mg/dL
Leukocytes, UA: NEGATIVE
Nitrite: NEGATIVE
Protein, ur: NEGATIVE mg/dL
Specific Gravity, Urine: 1.026 (ref 1.005–1.030)
pH: 7 (ref 5.0–8.0)

## 2015-12-10 LAB — CBC WITH DIFFERENTIAL/PLATELET
Basophils Absolute: 0.1 10*3/uL (ref 0.0–0.1)
Basophils Relative: 1 %
Eosinophils Absolute: 0.1 10*3/uL (ref 0.0–0.7)
Eosinophils Relative: 1 %
HCT: 33.6 % — ABNORMAL LOW (ref 36.0–46.0)
Hemoglobin: 10.4 g/dL — ABNORMAL LOW (ref 12.0–15.0)
Lymphocytes Relative: 22 %
Lymphs Abs: 2.1 10*3/uL (ref 0.7–4.0)
MCH: 20.8 pg — ABNORMAL LOW (ref 26.0–34.0)
MCHC: 31 g/dL (ref 30.0–36.0)
MCV: 67.1 fL — ABNORMAL LOW (ref 78.0–100.0)
Monocytes Absolute: 0.6 10*3/uL (ref 0.1–1.0)
Monocytes Relative: 6 %
Neutro Abs: 6.5 10*3/uL (ref 1.7–7.7)
Neutrophils Relative %: 70 %
Platelets: 365 10*3/uL (ref 150–400)
RBC: 5.01 MIL/uL (ref 3.87–5.11)
RDW: 17.3 % — ABNORMAL HIGH (ref 11.5–15.5)
WBC: 9.4 10*3/uL (ref 4.0–10.5)

## 2015-12-10 LAB — LIPASE, BLOOD: Lipase: 23 U/L (ref 11–51)

## 2015-12-10 LAB — POC URINE PREG, ED: Preg Test, Ur: NEGATIVE

## 2015-12-10 MED ORDER — KETOROLAC TROMETHAMINE 30 MG/ML IJ SOLN
30.0000 mg | Freq: Once | INTRAMUSCULAR | Status: AC
  Administered 2015-12-11: 30 mg via INTRAVENOUS
  Filled 2015-12-10: qty 1

## 2015-12-10 MED ORDER — ONDANSETRON 4 MG PO TBDP: 8.0000 mg | ORAL_TABLET | Freq: Once | ORAL | Status: DC

## 2015-12-10 NOTE — ED Triage Notes (Signed)
Pt st's she had surg on 7/25 to remove fibroids.  St's started having pain last pm under right rib cage with difficulty breathing.  Pt st's she doesn't feel like she can take a deep breath.

## 2015-12-10 NOTE — ED Provider Notes (Signed)
[facility] DEPT Provider Note   CSN: [id number] Arrival date & time: 12/10/15  2054  First Provider Contact:  None       History   Chief Complaint Chief Complaint  Patient presents with  . Chest Pain    HPI Jessica Dudley is a 40 y.o. female presents with SOB and right lower rib pain. PMH significant for uterine fibroids. She recently had surgery on 7/25 for removal of fibroids. She reported nausea for 4-5 days after the surgery which she attributed to anesthesia and narcotic pain medicine use. Yesterday she had an acute onset of right lower rib pain which radiates to her right shoulder. She took aleve which relieved the pain. She woke up this morning without pain. This evening the pain came back acutely. It is associated with SOB and inspiratory chest pain. Denies fever, chills, central chest pain, lower abdominal pain, N/V/D, dysuria. She denies abdominal surgeries other than fibroid surgery. She states the pain is worse with inspiration but denies exertional pain. It is constant. Denies hemoptysis, hormone use, smoking, unilateral leg swelling, prior DVT/PE.  HPI  Past Medical History:  Diagnosis Date  . Iron deficiency anemia   . Uterine fibroid   . Wears glasses     Patient Active Problem List   Diagnosis Date Noted  . Leiomyoma 11/29/2015  . Anemia 06/13/2011  . Fibroid (bleeding) (uterine) 06/13/2011  . Visit for preventive health examination 06/13/2011  . Abnormal urinalysis 06/13/2011  . Hemorrhagic ovarian cyst 09/30/2010  . VAGINITIS 11/03/2009  . SEBACEOUS CYST 11/03/2009  . GENITAL HERPES, HX OF 11/03/2009  . GENITAL HERPES 06/09/2007  . ANEMIA 06/09/2007  . OTHER AND UNSPECIFIED OVARIAN CYST 06/09/2007  . HEADACHE 06/09/2007  . URINARY INCONTINENCE 06/09/2007  . FREQUENCY, URINARY 06/09/2007    Past Surgical History:  Procedure Laterality Date  . CHROMOPERTUBATION N/A 11/29/2015   Procedure: ABDOMINAL MYOMECTOMY;  Surgeon: Governor Specking, MD;   Location: [facility];  Service: Gynecology;  Laterality: N/A;  . MYOMECTOMY    . NO PAST SURGERIES      OB History    Gravida Para Term Preterm AB Living   1 1           SAB TAB Ectopic Multiple Live Births                   Home Medications    Prior to Admission medications   Medication Sig Start Date End Date Taking? Authorizing Provider  acetaminophen (TYLENOL) 500 MG tablet Take 1,000 mg by mouth every 6 (six) hours as needed for headache.    Yes Historical Provider, MD  ibuprofen (ADVIL,MOTRIN) 800 MG tablet Take 1 tablet (800 mg total) by mouth every 8 (eight) hours as needed (mild pain). 11/30/15  Yes Governor Specking, MD  Iron, Ferrous Gluconate, 256 (28 Fe) MG TABS Take 256 mg by mouth daily.    Yes Historical Provider, MD  Magnesium Oxide (MAG-OX 400 PO) Take 400 mg by mouth daily.   Yes Historical Provider, MD  MULTIPLE VITAMIN PO Take 1 tablet by mouth daily.    Yes Historical Provider, MD  Naproxen Sodium (ALEVE) 220 MG CAPS Take 440 mg by mouth as needed (pain).    Yes Historical Provider, MD  ondansetron (ZOFRAN) 4 MG tablet Take 1 tablet (4 mg total) by mouth every 6 (six) hours as needed for nausea. 11/30/15  Yes Governor Specking, MD  valACYclovir (VALTREX) 500 MG tablet Take 500 mg by mouth 2 (two)  times daily. For 3-5 days for outbreak 11/28/15  Yes Historical Provider, MD  vitamin C (ASCORBIC ACID) 500 MG tablet Take 500 mg by mouth daily.   Yes Historical Provider, MD    Family History Family History  Problem Relation Age of Onset  . Hypertension Mother   . Prostate cancer Paternal Uncle   . Diabetes Maternal Grandmother   . Leukemia Cousin   . Brain cancer Cousin     Social History Social History  Substance Use Topics  . Smoking status: Never Smoker  . Smokeless tobacco: Never Used  . Alcohol use Yes     Comment: occasional     Allergies   Norco [hydrocodone-acetaminophen] and Oxycodone-acetaminophen   Review of Systems Review  of Systems  Constitutional: Negative for chills and fever.  Respiratory: Positive for shortness of breath. Negative for cough.   Cardiovascular: Positive for chest pain.       Inspiratory right sided rib pain  Gastrointestinal: Negative for nausea and vomiting.  Genitourinary: Negative for dysuria.  All other systems reviewed and are negative.    Physical Exam Updated Vital Signs BP 121/80 (BP Location: Right Arm)   Pulse 86   Temp 98.2 F (36.8 C) (Oral)   Resp (!) 32   Ht 5' 1.5" (1.562 m)   Wt 63.5 kg   LMP 11/29/2015   SpO2 100%   BMI 26.04 kg/m   Physical Exam  Constitutional: She is oriented to person, place, and time. She appears well-developed and well-nourished. No distress.  HENT:  Head: Normocephalic and atraumatic.  Eyes: Conjunctivae are normal. Pupils are equal, round, and reactive to light. Right eye exhibits no discharge. Left eye exhibits no discharge. No scleral icterus.  Neck: Normal range of motion. Neck supple.  Cardiovascular: Normal rate and regular rhythm.  Exam reveals no gallop and no friction rub.   No murmur heard. Pulmonary/Chest: Effort normal and breath sounds normal. Tachypnea noted. No respiratory distress. She has no wheezes. She has no rales. She exhibits no tenderness.  Abdominal: Soft. Bowel sounds are normal. She exhibits no distension and no mass. There is tenderness. There is no rebound and no guarding. No hernia.  RUQ pain  Musculoskeletal: She exhibits no edema.  No unilateral leg swelling  Neurological: She is alert and oriented to person, place, and time.  Skin: Skin is warm and dry.  Psychiatric: She has a normal mood and affect. Her behavior is normal.  Nursing note and vitals reviewed.    ED Treatments / Results  Labs (all labs ordered are listed, but only abnormal results are displayed) Labs Reviewed  URINALYSIS, ROUTINE W REFLEX MICROSCOPIC (NOT AT [facility]) - Abnormal; Notable for the following:       Result Value   Hgb  urine dipstick TRACE (*)    All other components within normal limits  CBC WITH DIFFERENTIAL/PLATELET - Abnormal; Notable for the following:    Hemoglobin 10.4 (*)    HCT 33.6 (*)    MCV 67.1 (*)    MCH 20.8 (*)    RDW 17.3 (*)    All other components within normal limits  COMPREHENSIVE METABOLIC PANEL - Abnormal; Notable for the following:    AST 14 (*)    All other components within normal limits  URINE MICROSCOPIC-ADD ON - Abnormal; Notable for the following:    Squamous Epithelial / LPF 0-5 (*)    Bacteria, UA RARE (*)    All other components within normal limits  D-DIMER, QUANTITATIVE (NOT  AT [facility]) - Abnormal; Notable for the following:    D-Dimer, Quant 1.61 (*)    All other components within normal limits  APTT - Abnormal; Notable for the following:    aPTT 40 (*)    All other components within normal limits  LIPASE, BLOOD  PROTIME-INR  POC URINE PREG, ED    EKG  EKG Interpretation None       Radiology Dg Chest 2 View  Result Date: 12/10/2015 CLINICAL DATA:  Patient with right-sided chest pain. EXAM: CHEST  2 VIEW COMPARISON:  None. FINDINGS: The heart size and mediastinal contours are within normal limits. Both lungs are clear. The visualized skeletal structures are unremarkable. IMPRESSION: No active cardiopulmonary disease. Electronically Signed   By: Lovey Newcomer M.D.   On: 12/10/2015 21:50   US Abdomen Limited Ruq  Result Date: 12/10/2015 CLINICAL DATA:  RIGHT upper quadrant pain with breathing for 1 day. EXAM: US ABDOMEN LIMITED - RIGHT UPPER QUADRANT COMPARISON:  None. FINDINGS: Gallbladder: Contracted post prandial gallbladder. No gallbladder wall thickening or pericholecystic fluid. No sonographic Murphy's sign elicited. 5 mm mural based echogenicity without acoustic shadowing or comet tail artifact. Common bile duct: Diameter: 3 mm Liver: No focal lesion identified. Within normal limits in parenchymal echogenicity. Hepatopetal portal vein. IMPRESSION: No acute  RIGHT upper quadrant process. 5 mm gallbladder polyp versus sludge. Electronically Signed   By: Elon Alas M.D.   On: 12/10/2015 23:38    Procedures Procedures (including critical care time)  Medications Ordered in ED Medications  ketorolac (TORADOL) 30 MG/ML injection 30 mg (30 mg Intravenous Given 12/11/15 0011)     Initial Impression / Assessment and Plan / ED Course  I have reviewed the triage vital signs and the nursing notes.  Pertinent labs & imaging results that were available during my care of the patient were reviewed by me and considered in my medical decision making (see chart for details).  Clinical Course  Comment By Time  Discussed with Dr. Myna Hidalgo who will admit to tele obs.   Abigail Butts, PA-C 08/54 [id number]   40 year old female presents with inspiratory right sided chest pain. Heart is regular R&R no MGR and lungs are CTA. She is not hypoxic, no chest tenderness. She is tender in the RUQ. RUQ US obtained which shows 29mm polyp vs sludge. CBC remarkable for anemia which shows improvement from when last checked. CMP unremarkable. UA is clean. Preg test is negative. Toradol given for pain.  Due to unexplained symptoms and recent surgery with inspiratory chest pain. D-dimer obtained which was positive. CTA of chest obtained to r/o PE. Patient signed out at shift change to [facility] Muthersbaugh PA-C. If CT is negative can d/c with pain control and PCP follow up. If positive admit.  Final Clinical Impressions(s) / ED Diagnoses   Final diagnoses:  RUQ pain  SOB (shortness of breath)  Rib pain on right side    New Prescriptions New Prescriptions   No medications on file     Recardo Evangelist, PA-C 12/11/15 [place], MD 12/12/15 [phone]

## 2015-12-11 ENCOUNTER — Observation Stay (HOSPITAL_BASED_OUTPATIENT_CLINIC_OR_DEPARTMENT_OTHER): Payer: BLUE CROSS/BLUE SHIELD

## 2015-12-11 ENCOUNTER — Encounter (HOSPITAL_COMMUNITY): Payer: Self-pay | Admitting: Family Medicine

## 2015-12-11 ENCOUNTER — Emergency Department (HOSPITAL_COMMUNITY): Payer: BLUE CROSS/BLUE SHIELD

## 2015-12-11 DIAGNOSIS — I2699 Other pulmonary embolism without acute cor pulmonale: Secondary | ICD-10-CM

## 2015-12-11 DIAGNOSIS — R0781 Pleurodynia: Secondary | ICD-10-CM

## 2015-12-11 DIAGNOSIS — D509 Iron deficiency anemia, unspecified: Secondary | ICD-10-CM

## 2015-12-11 DIAGNOSIS — R1011 Right upper quadrant pain: Secondary | ICD-10-CM

## 2015-12-11 DIAGNOSIS — R0602 Shortness of breath: Secondary | ICD-10-CM

## 2015-12-11 LAB — ECHOCARDIOGRAM COMPLETE
Height: 61.5 in
Weight: 2228.8 oz

## 2015-12-11 LAB — PROTIME-INR
INR: 1.08
Prothrombin Time: 14.1 seconds (ref 11.4–15.2)

## 2015-12-11 LAB — D-DIMER, QUANTITATIVE: D-Dimer, Quant: 1.61 ug/mL-FEU — ABNORMAL HIGH (ref 0.00–0.50)

## 2015-12-11 LAB — APTT: aPTT: 40 seconds — ABNORMAL HIGH (ref 24–36)

## 2015-12-11 MED ORDER — SODIUM CHLORIDE 0.9 % IV SOLN: 250.0000 mL | INTRAVENOUS | Status: DC | PRN

## 2015-12-11 MED ORDER — HYDROCODONE-ACETAMINOPHEN 5-325 MG PO TABS: 1.0000 | ORAL_TABLET | ORAL | Status: DC | PRN

## 2015-12-11 MED ORDER — ENOXAPARIN SODIUM 60 MG/0.6ML ~~LOC~~ SOLN
60.0000 mg | Freq: Two times a day (BID) | SUBCUTANEOUS | Status: DC
  Administered 2015-12-11 – 2015-12-12 (×2): 60 mg via SUBCUTANEOUS
  Filled 2015-12-11 (×2): qty 0.6

## 2015-12-11 MED ORDER — SODIUM CHLORIDE 0.9% FLUSH: 3.0000 mL | INTRAVENOUS | Status: DC | PRN

## 2015-12-11 MED ORDER — ACETAMINOPHEN 650 MG RE SUPP: 650.0000 mg | Freq: Four times a day (QID) | RECTAL | Status: DC | PRN

## 2015-12-11 MED ORDER — SODIUM CHLORIDE 0.9% FLUSH
3.0000 mL | Freq: Two times a day (BID) | INTRAVENOUS | Status: DC
  Administered 2015-12-11 – 2015-12-12 (×2): 3 mL via INTRAVENOUS

## 2015-12-11 MED ORDER — KETOROLAC TROMETHAMINE 30 MG/ML IJ SOLN: 30.0000 mg | Freq: Four times a day (QID) | INTRAMUSCULAR | Status: DC | PRN

## 2015-12-11 MED ORDER — ENOXAPARIN SODIUM 80 MG/0.8ML ~~LOC~~ SOLN
1.0000 mg/kg | Freq: Once | SUBCUTANEOUS | Status: AC
  Administered 2015-12-11: 65 mg via SUBCUTANEOUS
  Filled 2015-12-11: qty 0.8

## 2015-12-11 MED ORDER — POLYETHYLENE GLYCOL 3350 17 G PO PACK: 17.0000 g | PACK | Freq: Every day | ORAL | Status: DC | PRN

## 2015-12-11 MED ORDER — FERROUS FUMARATE 324 (106 FE) MG PO TABS
1.0000 | ORAL_TABLET | Freq: Every day | ORAL | Status: DC
  Administered 2015-12-11 – 2015-12-12 (×2): 106 mg via ORAL
  Filled 2015-12-11 (×2): qty 1

## 2015-12-11 MED ORDER — ONDANSETRON HCL 4 MG/2ML IJ SOLN: 4.0000 mg | Freq: Four times a day (QID) | INTRAMUSCULAR | Status: DC | PRN

## 2015-12-11 MED ORDER — ONDANSETRON HCL 4 MG PO TABS: 4.0000 mg | ORAL_TABLET | Freq: Four times a day (QID) | ORAL | Status: DC | PRN

## 2015-12-11 MED ORDER — IOPAMIDOL (ISOVUE-370) INJECTION 76%
INTRAVENOUS | Status: AC
  Administered 2015-12-11: 100 mL
  Filled 2015-12-11: qty 100

## 2015-12-11 MED ORDER — ACETAMINOPHEN 325 MG PO TABS: 650.0000 mg | ORAL_TABLET | Freq: Four times a day (QID) | ORAL | Status: DC | PRN

## 2015-12-11 MED ORDER — BISACODYL 5 MG PO TBEC: 5.0000 mg | DELAYED_RELEASE_TABLET | Freq: Every day | ORAL | Status: DC | PRN

## 2015-12-11 NOTE — Progress Notes (Signed)
Patient admitted from ED. Denies any pain.Alert and oriented. Up adlib.Oriented to room and surroundings. Family by the bedside.

## 2015-12-11 NOTE — Progress Notes (Signed)
ANTICOAGULATION CONSULT NOTE - Initial Consult  Pharmacy Consult for Lovenox Indication: pulmonary embolus  Allergies  Allergen Reactions  . Norco [Hydrocodone-Acetaminophen] Nausea And Vomiting  . Oxycodone-Acetaminophen Nausea And Vomiting    Patient Measurements: Height: 5' 1.5" (156.2 cm) Weight: 139 lb 4.8 oz (63.2 kg) IBW/kg (Calculated) : 48.95  Vital Signs: Temp: 98.5 F (36.9 C) (08/06 0345) Temp Source: Oral (08/06 0345) BP: 118/65 (08/06 0345) Pulse Rate: 75 (08/06 0345)  Labs:  Recent Labs  12/10/15 2117  HGB 10.4*  HCT 33.6*  PLT 365  CREATININE 0.82    Estimated Creatinine Clearance: 79.5 mL/min (by C-G formula based on SCr of 0.82 mg/dL).   Medical History: Past Medical History:  Diagnosis Date  . Iron deficiency anemia   . Uterine fibroid   . Wears glasses     Medications:  Prescriptions Prior to Admission  Medication Sig Dispense Refill Last Dose  . acetaminophen (TYLENOL) 500 MG tablet Take 1,000 mg by mouth every 6 (six) hours as needed for headache.    12/08/2015  . ibuprofen (ADVIL,MOTRIN) 800 MG tablet Take 1 tablet (800 mg total) by mouth every 8 (eight) hours as needed (mild pain). 30 tablet 0 Past Week at Unknown time  . Iron, Ferrous Gluconate, 256 (28 Fe) MG TABS Take 256 mg by mouth daily.    11/28/2015  . Magnesium Oxide (MAG-OX 400 PO) Take 400 mg by mouth daily.   Past Month at Unknown time  . MULTIPLE VITAMIN PO Take 1 tablet by mouth daily.    12/09/2015 at Unknown time  . Naproxen Sodium (ALEVE) 220 MG CAPS Take 440 mg by mouth as needed (pain).    12/10/2015 at Unknown time  . ondansetron (ZOFRAN) 4 MG tablet Take 1 tablet (4 mg total) by mouth every 6 (six) hours as needed for nausea. 20 tablet 0 10/31/2015  . valACYclovir (VALTREX) 500 MG tablet Take 500 mg by mouth 2 (two) times daily. For 3-5 days for outbreak  1 11/28/2015  . vitamin C (ASCORBIC ACID) 500 MG tablet Take 500 mg by mouth daily.   12/09/2015 at Unknown time     Assessment: 40 y.o. female with RLL PE for Lovenox.   Goal of Therapy:  Full anticoagulation with Lovenox Monitor platelets by anticoagulation protocol: Yes   Plan:  Lovenox 60 mg SQ q12h  Abbott, Bronson Curb 12/11/2015,3:58 AM

## 2015-12-11 NOTE — ED Provider Notes (Signed)
Care assumed from [facility], [place].    Jessica Dudley is a 40 y.o. female presents with inspiratory chest pain since last night.  Pt with tachypnea and SOB.  Pt had surgery on 11/29/15 for fibroids.  Pt with RUQ tenderness on exam, but [place] with sludge.  Positive d-dimer.  Pain improved with aleve.     Physical Exam  BP 108/72   Pulse 77   Temp 98.2 F (36.8 C) (Oral)   Resp 15   Ht 5' 1.5" (1.562 m)   Wt 63.5 kg   LMP 11/29/2015   SpO2 100%   BMI 26.04 kg/m   Physical Exam   Face to face Exam:   General: Awake  HEENT: Atraumatic  Resp: Normal effort  Cardiac: RRR Abd: Nondistended  Neuro:No focal weakness  Lymph: No adenopathy  Results for orders placed or performed during the hospital encounter of 12/10/15  Urinalysis, Routine w reflex microscopic (not at [facility])  Result Value Ref Range   Color, Urine YELLOW YELLOW   APPearance CLEAR CLEAR   Specific Gravity, Urine 1.026 1.005 - 1.030   pH 7.0 5.0 - 8.0   Glucose, UA NEGATIVE NEGATIVE mg/dL   Hgb urine dipstick TRACE (A) NEGATIVE   Bilirubin Urine NEGATIVE NEGATIVE   Ketones, ur NEGATIVE NEGATIVE mg/dL   Protein, ur NEGATIVE NEGATIVE mg/dL   Nitrite NEGATIVE NEGATIVE   Leukocytes, UA NEGATIVE NEGATIVE  CBC with Differential  Result Value Ref Range   WBC 9.4 4.0 - 10.5 K/uL   RBC 5.01 3.87 - 5.11 MIL/uL   Hemoglobin 10.4 (L) 12.0 - 15.0 g/dL   HCT 33.6 (L) 36.0 - 46.0 %   MCV 67.1 (L) 78.0 - 100.0 fL   MCH 20.8 (L) 26.0 - 34.0 pg   MCHC 31.0 30.0 - 36.0 g/dL   RDW 17.3 (H) 11.5 - 15.5 %   Platelets 365 150 - 400 K/uL   Neutrophils Relative % 70 %   Lymphocytes Relative 22 %   Monocytes Relative 6 %   Eosinophils Relative 1 %   Basophils Relative 1 %   Neutro Abs 6.5 1.7 - 7.7 K/uL   Lymphs Abs 2.1 0.7 - 4.0 K/uL   Monocytes Absolute 0.6 0.1 - 1.0 K/uL   Eosinophils Absolute 0.1 0.0 - 0.7 K/uL   Basophils Absolute 0.1 0.0 - 0.1 K/uL   RBC Morphology ELLIPTOCYTES   Comprehensive metabolic panel  Result  Value Ref Range   Sodium 139 135 - 145 mmol/L   Potassium 3.8 3.5 - 5.1 mmol/L   Chloride 106 101 - 111 mmol/L   CO2 25 22 - 32 mmol/L   Glucose, Bld 99 65 - 99 mg/dL   BUN 15 6 - 20 mg/dL   Creatinine, Ser 0.82 0.44 - 1.00 mg/dL   Calcium 9.8 8.9 - 10.3 mg/dL   Total Protein 7.1 6.5 - 8.1 g/dL   Albumin 3.8 3.5 - 5.0 g/dL   AST 14 (L) 15 - 41 U/L   ALT 15 14 - 54 U/L   Alkaline Phosphatase 45 38 - 126 U/L   Total Bilirubin 0.5 0.3 - 1.2 mg/dL   GFR calc non Af Amer >60 >60 mL/min   GFR calc Af Amer >60 >60 mL/min   Anion gap 8 5 - 15  Lipase, blood  Result Value Ref Range   Lipase 23 11 - 51 U/L  Urine microscopic-add on  Result Value Ref Range   Squamous Epithelial / LPF 0-5 (A) NONE SEEN  WBC, UA NONE SEEN 0 - 5 WBC/hpf   RBC / HPF 0-5 0 - 5 RBC/hpf   Bacteria, UA RARE (A) NONE SEEN  D-dimer, quantitative (not at [facility])  Result Value Ref Range   D-Dimer, Quant 1.61 (H) 0.00 - 0.50 ug/mL-FEU  POC Urine Pregnancy, ED (do NOT order at [facility])  Result Value Ref Range   Preg Test, Ur NEGATIVE NEGATIVE   Dg Chest 2 View  Result Date: 12/10/2015 CLINICAL DATA:  Patient with right-sided chest pain. EXAM: CHEST  2 VIEW COMPARISON:  None. FINDINGS: The heart size and mediastinal contours are within normal limits. Both lungs are clear. The visualized skeletal structures are unremarkable. IMPRESSION: No active cardiopulmonary disease. Electronically Signed   By: Lovey Newcomer M.D.   On: 12/10/2015 21:50   Ct Angio Chest Pe W/cm &/or Wo Cm  Result Date: 12/11/2015 CLINICAL DATA:  Inspiratory chest pain.  Recent myomectomy. EXAM: CT ANGIOGRAPHY CHEST WITH CONTRAST TECHNIQUE: Multidetector CT imaging of the chest was performed using the standard protocol during bolus administration of intravenous contrast. Multiplanar CT image reconstructions and MIPs were obtained to evaluate the vascular anatomy. CONTRAST:  60 cc Isovue 370 IV COMPARISON:  Chest radiographs yesterday FINDINGS: Cardiovascular:  There is an isolated subsegmental filling defect in the right lower lobe series 6, image 158. This is slightly eccentric within the vessel. Decreased opacification of the subsegmental branches to the lateral right lower lobe without discrete filling defect. Heart is normal in size, no right heart strain. Normal thoracic aorta without dissection. Mediastinum/Nodes: No adenopathy.  No pericardial fluid. Lungs/Pleura: Small focal peripheral opacity in the anterior right lower lobe abuts the pleura. The lungs are otherwise clear. No pulmonary edema. No pleural fluid. Upper Abdomen: No acute abnormality. Musculoskeletal: There are no acute or suspicious osseous abnormalities. Review of the MIP images confirms the above findings. IMPRESSION: 1. Subsegmental pulmonary embolus in the right lower lobe, acuity uncertain. No right heart strain. 2. Small focal peripheral opacity in the lateral right lower lobe. No discrete filling defects are seen within the subsegmental pulmonary arteries to this region, however there is diminished opacification in the segmental pulmonary artery, suspect this is a a small focal pulmonary infarct. Critical Value/emergent results were called by telephone at the time of interpretation on 12/11/2015 at 1:48 am to [place] , who verbally acknowledged these results. Electronically Signed   By: Jeb Levering M.D.   On: 12/11/2015 01:51   US Abdomen Limited Ruq  Result Date: 12/10/2015 CLINICAL DATA:  RIGHT upper quadrant pain with breathing for 1 day. EXAM: US ABDOMEN LIMITED - RIGHT UPPER QUADRANT COMPARISON:  None. FINDINGS: Gallbladder: Contracted post prandial gallbladder. No gallbladder wall thickening or pericholecystic fluid. No sonographic Murphy's sign elicited. 5 mm mural based echogenicity without acoustic shadowing or comet tail artifact. Common bile duct: Diameter: 3 mm Liver: No focal lesion identified. Within normal limits in parenchymal echogenicity. Hepatopetal  portal vein. IMPRESSION: No acute RIGHT upper quadrant process. 5 mm gallbladder polyp versus sludge. Electronically Signed   By: Elon Alas M.D.   On: 12/10/2015 23:38     ED Course  Procedures 1. SOB (shortness of breath)   2. RUQ pain   3. Rib pain on right side     MDM   Plan: CT angio pending.  If neg pt may be d/c home.    2:20 AM CT scan shows subsegmental pulmonary embolus of the right lower lobe.  Cannot admitting toe right leg pain in  the last several days and several days of prolonged immobilization after her abdominal surgery. We'll give Lovenox and admit.  PCP: [place].  2:30 AM Discussed with Dr. Myna Hidalgo who will admit to tele obs.     Jarrett Soho Muthersbaugh, PA-C 12/11/15 0230    Veryl Speak, MD 12/11/15 1452

## 2015-12-11 NOTE — Plan of Care (Signed)
Problem: Consults Goal: Venous Thromboembolism Patient Education See Patient Education Module for education specifics. Outcome: Progressing Patient educated about pulmonary embolus. Question answered.

## 2015-12-11 NOTE — Plan of Care (Signed)
Problem: Pain Managment: Goal: General experience of comfort will improve Outcome: Progressing Denies pain at this time   

## 2015-12-11 NOTE — Progress Notes (Signed)
  Echocardiogram 2D Echocardiogram has been performed.  Jennette Dubin 12/11/2015, 12:52 PM

## 2015-12-11 NOTE — ED Notes (Signed)
hospitalist at the bedside 

## 2015-12-11 NOTE — H&P (Signed)
History and Physical    Jessica Dudley [id number] DOB: 03-Feb-1976 DOA: 12/10/2015  PCP: Lottie Dawson, MD   Patient coming from: Home   Chief Complaint: Right lower chest pain and dyspnea   HPI: Jessica Dudley is a 40 y.o. female with medical history significant for iron deficiency anemia and uterine fibroids status post abdominal myomectomy on 11/29/2015 who now presents the emergency department with dyspnea and pain at the lower anterior right rib, worse with breathing. She reportedly tolerated the surgery well and had been convalescing back at home and using her incentive spirometer as directed. When the pain first developed earlier in the day of her presentation, patient took an Aleve and increased her incentive spirometer use, but symptoms continued to worsen. She denies pain at any other sites, denies cough or hemoptysis, denies palpitations, and denies lower extremity swelling, redness, or tenderness. Patient denies any personal or family history of VTE, but her mother at the bedside and notes that the patient's father had a DVT associated with varicose veins. Patient does not take hormonal contraceptive and denies smoking.  ED Course: Upon arrival to the ED, patient is found to be afebrile, saturating well on room air, tachypneic into the 30s, and with vitals otherwise stable. EKG demonstrates sinus tachycardia with nonspecific ST-T changes and chest x-rays negative for acute cardiopulmonary disease. CMP is unremarkable and CBC is notable for an improving microcytic anemia with a leukocytes. Urinalysis is unremarkable and d-dimer returned elevated to a value 1.61. CTA PE study was obtained and notable for PE in the right lower lobe of uncertain acuity. Also noted on the CTA is the absence of apparent right heart strain and a likely small focal pulmonary infarct in the lateral right lower lobe. Patient was treated with Toradol and 1 mg/kg of Lovenox in the ED. Given the patient's  respiratory distress and anemia, she will be observed in the hospital for further evaluation and initiation of treatment.  Review of Systems:  All other systems reviewed and apart from HPI, are negative.  Past Medical History:  Diagnosis Date  . Iron deficiency anemia   . Uterine fibroid   . Wears glasses     Past Surgical History:  Procedure Laterality Date  . CHROMOPERTUBATION N/A 11/29/2015   Procedure: ABDOMINAL MYOMECTOMY;  Surgeon: Governor Specking, MD;  Location: [facility];  Service: Gynecology;  Laterality: N/A;  . MYOMECTOMY    . NO PAST SURGERIES       reports that she has never smoked. She has never used smokeless tobacco. She reports that she drinks alcohol. She reports that she does not use drugs.  Allergies  Allergen Reactions  . Norco [Hydrocodone-Acetaminophen] Nausea And Vomiting  . Oxycodone-Acetaminophen Nausea And Vomiting    Family History  Problem Relation Age of Onset  . Hypertension Mother   . Prostate cancer Paternal Uncle   . Diabetes Maternal Grandmother   . Leukemia Cousin   . Brain cancer Cousin   . Deep vein thrombosis Father   . Varicose Veins Father      Prior to Admission medications   Medication Sig Start Date End Date Taking? Authorizing Provider  acetaminophen (TYLENOL) 500 MG tablet Take 1,000 mg by mouth every 6 (six) hours as needed for headache.    Yes Historical Provider, MD  ibuprofen (ADVIL,MOTRIN) 800 MG tablet Take 1 tablet (800 mg total) by mouth every 8 (eight) hours as needed (mild pain). 11/30/15  Yes Governor Specking, MD  Iron, Ferrous Gluconate,  256 (28 Fe) MG TABS Take 256 mg by mouth daily.    Yes Historical Provider, MD  Magnesium Oxide (MAG-OX 400 PO) Take 400 mg by mouth daily.   Yes Historical Provider, MD  MULTIPLE VITAMIN PO Take 1 tablet by mouth daily.    Yes Historical Provider, MD  Naproxen Sodium (ALEVE) 220 MG CAPS Take 440 mg by mouth as needed (pain).    Yes Historical Provider, MD    ondansetron (ZOFRAN) 4 MG tablet Take 1 tablet (4 mg total) by mouth every 6 (six) hours as needed for nausea. 11/30/15  Yes Governor Specking, MD  valACYclovir (VALTREX) 500 MG tablet Take 500 mg by mouth 2 (two) times daily. For 3-5 days for outbreak 11/28/15  Yes Historical Provider, MD  vitamin C (ASCORBIC ACID) 500 MG tablet Take 500 mg by mouth daily.   Yes Historical Provider, MD    Physical Exam: Vitals:   12/11/15 0300 12/11/15 0315 12/11/15 0333 12/11/15 0345  BP: 108/69 101/80  118/65  Pulse: 81 82  75  Resp: 17 15  16   Temp:   98.6 F (37 C) 98.5 F (36.9 C)  TempSrc:   Oral Oral  SpO2: 100% 100%  100%  Weight:    63.2 kg (139 lb 4.8 oz)  Height:    5' 1.5" (1.562 m)      Constitutional: NAD, calm, comfortable Eyes: PERTLA, lids and conjunctivae normal ENMT: Mucous membranes are moist. Posterior pharynx clear of any exudate or lesions.   Neck: normal, supple, no masses, no thyromegaly Respiratory: clear to auscultation bilaterally, no wheezing, no crackles. Tachypnea. No accessory muscle use.  Cardiovascular: S1 & S2 heard, regular rate and rhythm, no significant murmurs. No extremity edema. 2+ pedal pulses. No significant JVD. Abdomen: No distension, minimal tenderness at surgical site, no masses palpated. Bowel sounds normal.  Musculoskeletal: no clubbing / cyanosis. No joint deformity upper and lower extremities. Normal muscle tone.  Skin: no significant rashes, lesions, ulcers. Warm, dry, well-perfused. Neurologic: CN 2-12 grossly intact. Sensation intact, DTR normal. Strength 5/5 in all 4 limbs.  Psychiatric: Normal judgment and insight. Alert and oriented x 3. Normal mood and affect.     Labs on Admission: I have personally reviewed following labs and imaging studies  CBC:  Recent Labs Lab 12/10/15 2117  WBC 9.4  NEUTROABS 6.5  HGB 10.4*  HCT 33.6*  MCV 67.1*  PLT 99991111   Basic Metabolic Panel:  Recent Labs Lab 12/10/15 2117  NA 139  K 3.8  CL  106  CO2 25  GLUCOSE 99  BUN 15  CREATININE 0.82  CALCIUM 9.8   GFR: Estimated Creatinine Clearance: 79.5 mL/min (by C-G formula based on SCr of 0.82 mg/dL). Liver Function Tests:  Recent Labs Lab 12/10/15 2117  AST 14*  ALT 15  ALKPHOS 45  BILITOT 0.5  PROT 7.1  ALBUMIN 3.8    Recent Labs Lab 12/10/15 2117  LIPASE 23   No results for input(s): AMMONIA in the last 168 hours. Coagulation Profile: No results for input(s): INR, PROTIME in the last 168 hours. Cardiac Enzymes: No results for input(s): CKTOTAL, CKMB, CKMBINDEX, TROPONINI in the last 168 hours. BNP (last 3 results) No results for input(s): PROBNP in the last 8760 hours. HbA1C: No results for input(s): HGBA1C in the last 72 hours. CBG: No results for input(s): GLUCAP in the last 168 hours. Lipid Profile: No results for input(s): CHOL, HDL, LDLCALC, TRIG, CHOLHDL, LDLDIRECT in the last 72 hours. Thyroid Function  Tests: No results for input(s): TSH, T4TOTAL, FREET4, T3FREE, THYROIDAB in the last 72 hours. Anemia Panel: No results for input(s): VITAMINB12, FOLATE, FERRITIN, TIBC, IRON, RETICCTPCT in the last 72 hours. Urine analysis:    Component Value Date/Time   COLORURINE YELLOW 12/10/2015 2120   APPEARANCEUR CLEAR 12/10/2015 2120   LABSPEC 1.026 12/10/2015 2120   PHURINE 7.0 12/10/2015 2120   GLUCOSEU NEGATIVE 12/10/2015 2120   HGBUR TRACE (A) 12/10/2015 2120   HGBUR negative 02/28/2009 0905   BILIRUBINUR NEGATIVE 12/10/2015 2120   BILIRUBINUR n 06/25/2011 1142   KETONESUR NEGATIVE 12/10/2015 2120   PROTEINUR NEGATIVE 12/10/2015 2120   UROBILINOGEN 1.0 06/25/2011 1142   UROBILINOGEN 1.0 08/04/2010 1055   NITRITE NEGATIVE 12/10/2015 2120   LEUKOCYTESUR NEGATIVE 12/10/2015 2120   Sepsis Labs: @LABRCNTIP (procalcitonin:4,lacticidven:4) )No results found for this or any previous visit (from the past 240 hour(s)).   Radiological Exams on Admission: Dg Chest 2 View  Result Date:  12/10/2015 CLINICAL DATA:  Patient with right-sided chest pain. EXAM: CHEST  2 VIEW COMPARISON:  None. FINDINGS: The heart size and mediastinal contours are within normal limits. Both lungs are clear. The visualized skeletal structures are unremarkable. IMPRESSION: No active cardiopulmonary disease. Electronically Signed   By: Lovey Newcomer M.D.   On: 12/10/2015 21:50   Ct Angio Chest Pe W/cm &/or Wo Cm  Result Date: 12/11/2015 CLINICAL DATA:  Inspiratory chest pain.  Recent myomectomy. EXAM: CT ANGIOGRAPHY CHEST WITH CONTRAST TECHNIQUE: Multidetector CT imaging of the chest was performed using the standard protocol during bolus administration of intravenous contrast. Multiplanar CT image reconstructions and MIPs were obtained to evaluate the vascular anatomy. CONTRAST:  60 cc Isovue 370 IV COMPARISON:  Chest radiographs yesterday FINDINGS: Cardiovascular: There is an isolated subsegmental filling defect in the right lower lobe series 6, image 158. This is slightly eccentric within the vessel. Decreased opacification of the subsegmental branches to the lateral right lower lobe without discrete filling defect. Heart is normal in size, no right heart strain. Normal thoracic aorta without dissection. Mediastinum/Nodes: No adenopathy.  No pericardial fluid. Lungs/Pleura: Small focal peripheral opacity in the anterior right lower lobe abuts the pleura. The lungs are otherwise clear. No pulmonary edema. No pleural fluid. Upper Abdomen: No acute abnormality. Musculoskeletal: There are no acute or suspicious osseous abnormalities. Review of the MIP images confirms the above findings. IMPRESSION: 1. Subsegmental pulmonary embolus in the right lower lobe, acuity uncertain. No right heart strain. 2. Small focal peripheral opacity in the lateral right lower lobe. No discrete filling defects are seen within the subsegmental pulmonary arteries to this region, however there is diminished opacification in the segmental pulmonary  artery, suspect this is a a small focal pulmonary infarct. Critical Value/emergent results were called by telephone at the time of interpretation on 12/11/2015 at 1:48 am to [place] , who verbally acknowledged these results. Electronically Signed   By: Jeb Levering M.D.   On: 12/11/2015 01:51   US Abdomen Limited Ruq  Result Date: 12/10/2015 CLINICAL DATA:  RIGHT upper quadrant pain with breathing for 1 day. EXAM: US ABDOMEN LIMITED - RIGHT UPPER QUADRANT COMPARISON:  None. FINDINGS: Gallbladder: Contracted post prandial gallbladder. No gallbladder wall thickening or pericholecystic fluid. No sonographic Murphy's sign elicited. 5 mm mural based echogenicity without acoustic shadowing or comet tail artifact. Common bile duct: Diameter: 3 mm Liver: No focal lesion identified. Within normal limits in parenchymal echogenicity. Hepatopetal portal vein. IMPRESSION: No acute RIGHT upper quadrant process. 5 mm gallbladder polyp  versus sludge. Electronically Signed   By: Elon Alas M.D.   On: 12/10/2015 23:38    EKG: Independently reviewed. Sinus tachycardia, non-specific ST-T abnormalities, no prior available  Assessment/Plan  1. Acute PE  - Likely provoked by recent surgery and subsequent imobility - CT suggests an associated small pulmonary infarct in lateral RLL - Hemodynamically, she has remained stable; tachypnea has resolved while in the ED  - Given 1 mg/kg Lovenox in ED and will plan to continue; CM consultation has been requested  - There is no evidence for right heart strain on CT; EKG with non-specific ST-T changes; TTE ordered  - Check BLE dopplers for possible source - Monitor on telemetry overnight pending TTE    2. 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

## 2015-12-11 NOTE — Progress Notes (Signed)
[facility]                                                                              Patient Demographics  Jessica Dudley, is a 40 y.o. female, DOB - Jun 08, 1975, [id number]  Admit date - 12/10/2015   Admitting Physician Vianne Bulls, MD  Outpatient Primary MD for the patient is Lottie Dawson, MD  Outpatient specialists:   LOS - 0  days    Chief Complaint  Patient presents with  . Chest Pain       Brief summary    Jessica Dudley is a 40 y.o. female with medical history significant for iron deficiency anemia and uterine fibroids status post abdominal myomectomy on 11/29/2015 who now presents the emergency department with dyspnea and pain at the lower anterior right rib, worse with breathing. She reportedly tolerated the surgery well and had been convalescing back at home and using her incentive spirometer as directed. When the pain first developed earlier in the day of her presentation, patient took an Aleve and increased her incentive spirometer use, but symptoms continued to worsen. Patient's father had a DVT associated with varicose veins in his 11's.. Patient does not take hormonal contraceptive and denies smoking. ED Course: Upon arrival to the ED, patient is found to be afebrile, saturating well on room air, tachypneic into the 30s, and with vitals otherwise stable. EKG demonstrates sinus tachycardia with nonspecific ST-T changes and chest x-rays negative for acute cardiopulmonary disease. CMP is unremarkable and CBC is notable for an improving microcytic anemia with a leukocytes. Urinalysis is unremarkable and d-dimer returned elevated to a value 1.61. CTA PE study was obtained and notable for PE in the right lower lobe of uncertain acuity. Also noted on the CTA is the absence of apparent right heart strain and a likely small focal pulmonary infarct in the lateral right lower lobe. Patient was treated with Toradol and 1 mg/kg of Lovenox in the ED.      Assessment & Plan    Principal Problem:   Acute PE  - Likely provoked by recent surgery and subsequent immobility, CT suggested associated small pulmonary infarct in lateral RLL - Given 1 mg/kg Lovenox in ED and will continue for now, case management consulted for co-pays for NOAC's. Discussed in detail with the patient regarding various anticoagulation options Lovenox/Coumadin versus NOAC's. Patient prefers NOACs - Follow Doppler US LE, echo    Chronic microcytic anemia   - Hgb 10.4 on admission with MCV 67.1 and elliptocytes, likely iron deficiency anemia , has history of menorrhagia and uterine fibroids. Had a recent myomectomy.  - Hgb has improved since the recent surgery and there is no s/s of active blood-loss   - Follow H&H  Code Status: FC DVT Prophylaxis:  Lovenox  Family Communication: Discussed in detail with the patient, all imaging results, lab results explained to the patient and mother    Disposition Plan:   Time Spent in minutes  25 minutes  Procedures:  CTA chest   Consultants:   none  Antimicrobials:      Medications  Scheduled Meds: . enoxaparin (LOVENOX) injection  60  mg Subcutaneous BID  . Ferrous Fumarate  1 tablet Oral Daily  . sodium chloride flush  3 mL Intravenous Q12H  . sodium chloride flush  3 mL Intravenous Q12H   Continuous Infusions:  PRN Meds:.sodium chloride, acetaminophen **OR** acetaminophen, bisacodyl, HYDROcodone-acetaminophen, ketorolac, ondansetron **OR** ondansetron (ZOFRAN) IV, polyethylene glycol, sodium chloride flush   Antibiotics   Anti-infectives    None        Subjective:   Jessica Dudley was seen and examined today.  Patient denies dizziness, chest pain, shortness of breath, abdominal pain, N/V/D/C, new weakness, numbess, tingling. No acute events overnight.    Objective:   Vitals:   12/11/15 0300 12/11/15 0315 12/11/15 0333 12/11/15 0345  BP: 108/69 101/80  118/65  Pulse: 81 82  75  Resp: 17  15  16   Temp:   98.6 F (37 C) 98.5 F (36.9 C)  TempSrc:   Oral Oral  SpO2: 100% 100%  100%  Weight:    63.2 kg (139 lb 4.8 oz)  Height:    5' 1.5" (1.562 m)   No intake or output data in the 24 hours ending 12/11/15 1149   Wt Readings from Last 3 Encounters:  12/11/15 63.2 kg (139 lb 4.8 oz)  11/29/15 62.6 kg (138 lb)  10/05/15 64 kg (141 lb)     Exam  General: Alert and oriented x 3, NAD  HEENT:  PERRLA, EOMI, Anicteric Sclera, mucous membranes moist.   Neck: Supple, no JVD, no masses  Cardiovascular: S1 S2 auscultated, no rubs, murmurs or gallops. Regular rate and rhythm.  Respiratory: Clear to auscultation bilaterally, no wheezing, rales or rhonchi  Gastrointestinal: Soft, nontender, nondistended, + bowel sounds  Ext: no cyanosis clubbing or edema  Neuro: AAOx3, Cr N's II- XII. Strength 5/5 upper and lower extremities bilaterally  Skin: No rashes  Psych: Normal affect and demeanor, alert and oriented x3    Data Reviewed:  I have personally reviewed following labs and imaging studies  Micro Results No results found for this or any previous visit (from the past 240 hour(s)).  Radiology Reports Dg Chest 2 View  Result Date: 12/10/2015 CLINICAL DATA:  Patient with right-sided chest pain. EXAM: CHEST  2 VIEW COMPARISON:  None. FINDINGS: The heart size and mediastinal contours are within normal limits. Both lungs are clear. The visualized skeletal structures are unremarkable. IMPRESSION: No active cardiopulmonary disease. Electronically Signed   By: Lovey Newcomer M.D.   On: 12/10/2015 21:50   Ct Angio Chest Pe W/cm &/or Wo Cm  Result Date: 12/11/2015 CLINICAL DATA:  Inspiratory chest pain.  Recent myomectomy. EXAM: CT ANGIOGRAPHY CHEST WITH CONTRAST TECHNIQUE: Multidetector CT imaging of the chest was performed using the standard protocol during bolus administration of intravenous contrast. Multiplanar CT image reconstructions and MIPs were obtained to evaluate the  vascular anatomy. CONTRAST:  60 cc Isovue 370 IV COMPARISON:  Chest radiographs yesterday FINDINGS: Cardiovascular: There is an isolated subsegmental filling defect in the right lower lobe series 6, image 158. This is slightly eccentric within the vessel. Decreased opacification of the subsegmental branches to the lateral right lower lobe without discrete filling defect. Heart is normal in size, no right heart strain. Normal thoracic aorta without dissection. Mediastinum/Nodes: No adenopathy.  No pericardial fluid. Lungs/Pleura: Small focal peripheral opacity in the anterior right lower lobe abuts the pleura. The lungs are otherwise clear. No pulmonary edema. No pleural fluid. Upper Abdomen: No acute abnormality. Musculoskeletal: There are no acute or suspicious osseous abnormalities. Review of  the MIP images confirms the above findings. IMPRESSION: 1. Subsegmental pulmonary embolus in the right lower lobe, acuity uncertain. No right heart strain. 2. Small focal peripheral opacity in the lateral right lower lobe. No discrete filling defects are seen within the subsegmental pulmonary arteries to this region, however there is diminished opacification in the segmental pulmonary artery, suspect this is a a small focal pulmonary infarct. Critical Value/emergent results were called by telephone at the time of interpretation on 12/11/2015 at 1:48 am to [place] , who verbally acknowledged these results. Electronically Signed   By: Jeb Levering M.D.   On: 12/11/2015 01:51   US Abdomen Limited Ruq  Result Date: 12/10/2015 CLINICAL DATA:  RIGHT upper quadrant pain with breathing for 1 day. EXAM: US ABDOMEN LIMITED - RIGHT UPPER QUADRANT COMPARISON:  None. FINDINGS: Gallbladder: Contracted post prandial gallbladder. No gallbladder wall thickening or pericholecystic fluid. No sonographic Murphy's sign elicited. 5 mm mural based echogenicity without acoustic shadowing or comet tail artifact. Common bile duct:  Diameter: 3 mm Liver: No focal lesion identified. Within normal limits in parenchymal echogenicity. Hepatopetal portal vein. IMPRESSION: No acute RIGHT upper quadrant process. 5 mm gallbladder polyp versus sludge. Electronically Signed   By: Elon Alas M.D.   On: 12/10/2015 23:38    Lab Data:  CBC:  Recent Labs Lab 12/10/15 2117  WBC 9.4  NEUTROABS 6.5  HGB 10.4*  HCT 33.6*  MCV 67.1*  PLT 99991111   Basic Metabolic Panel:  Recent Labs Lab 12/10/15 2117  NA 139  K 3.8  CL 106  CO2 25  GLUCOSE 99  BUN 15  CREATININE 0.82  CALCIUM 9.8   GFR: Estimated Creatinine Clearance: 79.5 mL/min (by C-G formula based on SCr of 0.82 mg/dL). Liver Function Tests:  Recent Labs Lab 12/10/15 2117  AST 14*  ALT 15  ALKPHOS 45  BILITOT 0.5  PROT 7.1  ALBUMIN 3.8    Recent Labs Lab 12/10/15 2117  LIPASE 23   No results for input(s): AMMONIA in the last 168 hours. Coagulation Profile:  Recent Labs Lab 12/11/15 0434  INR 1.08   Cardiac Enzymes: No results for input(s): CKTOTAL, CKMB, CKMBINDEX, TROPONINI in the last 168 hours. BNP (last 3 results) No results for input(s): PROBNP in the last 8760 hours. HbA1C: No results for input(s): HGBA1C in the last 72 hours. CBG: No results for input(s): GLUCAP in the last 168 hours. Lipid Profile: No results for input(s): CHOL, HDL, LDLCALC, TRIG, CHOLHDL, LDLDIRECT in the last 72 hours. Thyroid Function Tests: No results for input(s): TSH, T4TOTAL, FREET4, T3FREE, THYROIDAB in the last 72 hours. Anemia Panel: No results for input(s): VITAMINB12, FOLATE, FERRITIN, TIBC, IRON, RETICCTPCT in the last 72 hours. Urine analysis:    Component Value Date/Time   COLORURINE YELLOW 12/10/2015 2120   APPEARANCEUR CLEAR 12/10/2015 2120   LABSPEC 1.026 12/10/2015 2120   PHURINE 7.0 12/10/2015 2120   GLUCOSEU NEGATIVE 12/10/2015 2120   HGBUR TRACE (A) 12/10/2015 2120   HGBUR negative 02/28/2009 0905   BILIRUBINUR NEGATIVE  12/10/2015 2120   BILIRUBINUR n 06/25/2011 1142   KETONESUR NEGATIVE 12/10/2015 2120   PROTEINUR NEGATIVE 12/10/2015 2120   UROBILINOGEN 1.0 06/25/2011 1142   UROBILINOGEN 1.0 08/04/2010 1055   NITRITE NEGATIVE 12/10/2015 2120   LEUKOCYTESUR NEGATIVE 12/10/2015 2120     RAI,RIPUDEEP M.D. [facility] 12/11/2015, 11:49 AM  Pager: [phone] Between 7am to 7pm - call Pager - [phone]  After 7pm go to [email or web address] - password [facility]  Call night coverage person covering after 7pm

## 2015-12-11 NOTE — Progress Notes (Signed)
VASCULAR LAB PRELIMINARY  PRELIMINARY  PRELIMINARY  PRELIMINARY  Bilateral lower extremity venous duplex has been completed.     Bilateral:  No evidence of DVT, superficial thrombosis, or Baker's Cyst.    Janifer Adie, RVT, RDMS 12/11/2015, 11:54 AM

## 2015-12-12 DIAGNOSIS — D509 Iron deficiency anemia, unspecified: Secondary | ICD-10-CM | POA: Diagnosis not present

## 2015-12-12 DIAGNOSIS — R0781 Pleurodynia: Secondary | ICD-10-CM | POA: Diagnosis not present

## 2015-12-12 DIAGNOSIS — R0602 Shortness of breath: Secondary | ICD-10-CM

## 2015-12-12 DIAGNOSIS — I2699 Other pulmonary embolism without acute cor pulmonale: Secondary | ICD-10-CM | POA: Diagnosis not present

## 2015-12-12 LAB — BASIC METABOLIC PANEL
Anion gap: 5 (ref 5–15)
BUN: 9 mg/dL (ref 6–20)
CO2: 25 mmol/L (ref 22–32)
Calcium: 9 mg/dL (ref 8.9–10.3)
Chloride: 109 mmol/L (ref 101–111)
Creatinine, Ser: 0.71 mg/dL (ref 0.44–1.00)
GFR calc Af Amer: >60 mL/min (ref >60)
GFR calc non Af Amer: >60 mL/min (ref >60)
Glucose, Bld: 104 mg/dL — ABNORMAL HIGH (ref 65–99)
Potassium: 4 mmol/L (ref 3.5–5.1)
Sodium: 139 mmol/L (ref 135–145)

## 2015-12-12 LAB — CBC
HCT: 31.5 % — ABNORMAL LOW (ref 36.0–46.0)
Hemoglobin: 9.4 g/dL — ABNORMAL LOW (ref 12.0–15.0)
MCH: 19.9 pg — ABNORMAL LOW (ref 26.0–34.0)
MCHC: 29.8 g/dL — ABNORMAL LOW (ref 30.0–36.0)
MCV: 66.7 fL — ABNORMAL LOW (ref 78.0–100.0)
Platelets: 335 10*3/uL (ref 150–400)
RBC: 4.72 MIL/uL (ref 3.87–5.11)
RDW: 17.2 % — ABNORMAL HIGH (ref 11.5–15.5)
WBC: 6.5 10*3/uL (ref 4.0–10.5)

## 2015-12-12 MED ORDER — RIVAROXABAN (XARELTO) VTE STARTER PACK (15 & 20 MG): ORAL_TABLET | ORAL | 0 refills | Status: DC

## 2015-12-12 MED ORDER — RIVAROXABAN 20 MG PO TABS: 20.0000 mg | ORAL_TABLET | Freq: Every day | ORAL | Status: DC

## 2015-12-12 MED ORDER — RIVAROXABAN 20 MG PO TABS: 20.0000 mg | ORAL_TABLET | Freq: Every day | ORAL | 4 refills | Status: DC

## 2015-12-12 MED ORDER — RIVAROXABAN 15 MG PO TABS: 15.0000 mg | ORAL_TABLET | Freq: Two times a day (BID) | ORAL | Status: DC

## 2015-12-12 NOTE — Progress Notes (Signed)
Jessica Dudley to be D/C'd Home per MD order. Discussed with the patient and all questions fully answered.    Medication List    TAKE these medications   acetaminophen 500 MG tablet Commonly known as:  TYLENOL Take 1,000 mg by mouth every 6 (six) hours as needed for headache.   ALEVE 220 MG Caps Generic drug:  Naproxen Sodium Take 440 mg by mouth as needed (pain).   ibuprofen 800 MG tablet Commonly known as:  ADVIL,MOTRIN Take 1 tablet (800 mg total) by mouth every 8 (eight) hours as needed (mild pain).   Iron (Ferrous Gluconate) 256 (28 Fe) MG Tabs Take 256 mg by mouth daily.   MAG-OX 400 PO Take 400 mg by mouth daily.   MULTIPLE VITAMIN PO Take 1 tablet by mouth daily.   ondansetron 4 MG tablet Commonly known as:  ZOFRAN Take 1 tablet (4 mg total) by mouth every 6 (six) hours as needed for nausea.   Rivaroxaban 15 & 20 MG Tbpk Take as directed on package: Start with one 15mg  tablet by mouth twice a day with food. On Day 22, 01/03/16, switch to one 20mg  tablet once a day with food.   rivaroxaban 20 MG Tabs tablet Commonly known as:  XARELTO Take 1 tablet (20 mg total) by mouth daily with supper. Start taking on:  01/03/2016   valACYclovir 500 MG tablet Commonly known as:  VALTREX Take 500 mg by mouth 2 (two) times daily. For 3-5 days for outbreak   vitamin C 500 MG tablet Commonly known as:  ASCORBIC ACID Take 500 mg by mouth daily.       VVS, Skin clean, dry and intact without evidence of skin break down, no evidence of skin tears noted.  IV catheter discontinued intact. Site without signs and symptoms of complications. Dressing and pressure applied.  An After Visit Summary was printed and given to the patient.  Patient escorted via [place], and D/C home via private auto.  [name]  12/12/2015 3:36 PM

## 2015-12-12 NOTE — Progress Notes (Signed)
ANTICOAGULATION CONSULT NOTE - Initial Consult  Pharmacy Consult for Lovenox>> xarelto Indication: pulmonary embolus  Allergies  Allergen Reactions  . Norco [Hydrocodone-Acetaminophen] Nausea And Vomiting  . Oxycodone-Acetaminophen Nausea And Vomiting    Patient Measurements: Height: 5' 1.5" (156.2 cm) Weight: 139 lb 4.8 oz (63.2 kg) IBW/kg (Calculated) : 48.95  Vital Signs: Temp: 98.6 F (37 C) (08/07 0519) Temp Source: Oral (08/07 0519) BP: 107/64 (08/07 0519) Pulse Rate: 73 (08/07 0519)  Labs:  Recent Labs  12/10/15 2117 12/11/15 0434 12/12/15 0217  HGB 10.4*  --  9.4*  HCT 33.6*  --  31.5*  PLT 365  --  335  APTT  --  40*  --   LABPROT  --  14.1  --   INR  --  1.08  --   CREATININE 0.82  --  0.71    Estimated Creatinine Clearance: 81.5 mL/min (by C-G formula based on SCr of 0.8 mg/dL).     Assessment: 40 y.o. female with RLL PE on Lovenox and to transition to Xarelto. Last dose of lovenox was at 10 am today. -Hg= 9.4, plt= 35, CrCl ~ 80  Goal of Therapy:  Monitor platelets by anticoagulation protocol: Yes   Plan:  -Xarelto 15mg  po bid for 21days then 20mg  po daily -Would begin 20mg  po daily on 8/29 to make the transition easier -Will provide patient education  Hildred Laser, Pharm D 12/12/2015 11:42 AM

## 2015-12-12 NOTE — Discharge Summary (Signed)
Physician Discharge Summary   Patient ID: Jessica Dudley MRN: [id number] DOB/AGE: 1975-09-28 40 y.o.  Admit date: 12/10/2015 Discharge date: 12/12/2015  Primary Care Physician:  Lottie Dawson, MD  Discharge Diagnoses:    . PE (pulmonary thromboembolism) ([place]) . Microcytic anemia   Consults:  NONE   Recommendations for Outpatient Follow-up:  1. STARTED ON XARELTO for likely provoked PE 2. Please repeat CBC/BMET at next visit 3. Please follow blood/urine cultures till final   DIET: regular     Allergies:   Allergies  Allergen Reactions  . Norco [Hydrocodone-Acetaminophen] Nausea And Vomiting  . Oxycodone-Acetaminophen Nausea And Vomiting     DISCHARGE MEDICATIONS: Current Discharge Medication List    START taking these medications   Details  rivaroxaban (XARELTO) 20 MG TABS tablet Take 1 tablet (20 mg total) by mouth daily with supper. Qty: 30 tablet, Refills: 4    Rivaroxaban 15 & 20 MG TBPK Take as directed on package: Start with one 15mg  tablet by mouth twice a day with food. On Day 22, 01/03/16, switch to one 20mg  tablet once a day with food. Qty: 51 each, Refills: 0      CONTINUE these medications which have NOT CHANGED   Details  acetaminophen (TYLENOL) 500 MG tablet Take 1,000 mg by mouth every 6 (six) hours as needed for headache.     ibuprofen (ADVIL,MOTRIN) 800 MG tablet Take 1 tablet (800 mg total) by mouth every 8 (eight) hours as needed (mild pain). Qty: 30 tablet, Refills: 0    Iron, Ferrous Gluconate, 256 (28 Fe) MG TABS Take 256 mg by mouth daily.     Magnesium Oxide (MAG-OX 400 PO) Take 400 mg by mouth daily.    MULTIPLE VITAMIN PO Take 1 tablet by mouth daily.     Naproxen Sodium (ALEVE) 220 MG CAPS Take 440 mg by mouth as needed (pain).     ondansetron (ZOFRAN) 4 MG tablet Take 1 tablet (4 mg total) by mouth every 6 (six) hours as needed for nausea. Qty: 20 tablet, Refills: 0    valACYclovir (VALTREX) 500 MG tablet Take 500 mg by  mouth 2 (two) times daily. For 3-5 days for outbreak Refills: 1    vitamin C (ASCORBIC ACID) 500 MG tablet Take 500 mg by mouth daily.         Brief H and P: For complete details please refer to admission H and P, but in brief Jessica A Adamsis a 40 y.o.femalewith medical history significant for iron deficiency anemia and uterine fibroids status post abdominal myomectomy on 11/29/2015 who now presents the emergency department with dyspnea and pain at the lower anterior right rib, worse with breathing. She reportedly tolerated the surgery well and had been convalescing back at home and using her incentive spirometer as directed. When the pain first developed earlier in the day of her presentation, patient took an Aleve and increased her incentive spirometer use, but symptoms continued to worsen. Patient's father had a DVT associated with varicose veins in his 65's.. Patient does not take hormonal contraceptive and denies smoking. ED Course:Upon arrival to the ED, patient is found to be afebrile, saturating well on room air, tachypneic into the 30s, and with vitals otherwise stable. EKG demonstrates sinus tachycardia with nonspecific ST-T changes and chest x-rays negative for acute cardiopulmonary disease. CMP is unremarkable and CBC is notable for an improving microcytic anemia with a leukocytes. Urinalysis is unremarkable and d-dimer returned elevated to a value 1.61. CTA PE study was obtained and  notable for PE in the right lower lobe of uncertain acuity. Also noted on the CTA is the absence of apparent right heart strain and a likely small focal pulmonary infarct in the lateral right lower lobe. Patient was treated with Toradol and 1 mg/kg of Lovenox in the ED.   Hospital Course:   Acute PE  - Likely provoked by recent surgery and subsequent immobility, CT suggested associated small pulmonary infarct in lateral RLL - Given 1 mg/kg Lovenox in ED, case management was consulted for co-pays for  NOAC's. Discussed in detail with the patient regarding various anticoagulation options Lovenox/Coumadin versus NOAC's. Patient prefers NOACs. Started on xarelto.  -Doppler US LE negative for DVT  - echo EF 60-65%, no RV strain   Chronic microcytic anemia  - Hgb 10.4 on admission with MCV 67.1 and elliptocytes, likely iron deficiency anemia , has history of menorrhagia and uterine fibroids. Had a recent myomectomy.  - Hgb has improved since the recent surgery and there is no s/s of active blood-loss    Day of Discharge BP 107/64 (BP Location: Left Arm)   Pulse 73   Temp 98.6 F (37 C) (Oral)   Resp 16   Ht 5' 1.5" (1.562 m)   Wt 63.2 kg (139 lb 4.8 oz)   LMP 11/29/2015   SpO2 100%   BMI 25.89 kg/m   Physical Exam: General: Alert and awake oriented x3 not in any acute distress. HEENT: anicteric sclera, pupils reactive to light and accommodation CVS: S1-S2 clear no murmur rubs or gallops Chest: clear to auscultation bilaterally, no wheezing rales or rhonchi Abdomen: soft nontender, nondistended, normal bowel sounds Extremities: no cyanosis, clubbing or edema noted bilaterally Neuro: Cranial nerves II-XII intact, no focal neurological deficits   The results of significant diagnostics from this hospitalization (including imaging, microbiology, ancillary and laboratory) are listed below for reference.    LAB RESULTS: Basic Metabolic Panel:  Recent Labs Lab 12/10/15 2117 12/12/15 0217  NA 139 139  K 3.8 4.0  CL 106 109  CO2 25 25  GLUCOSE 99 104*  BUN 15 9  CREATININE 0.82 0.71  CALCIUM 9.8 9.0   Liver Function Tests:  Recent Labs Lab 12/10/15 2117  AST 14*  ALT 15  ALKPHOS 45  BILITOT 0.5  PROT 7.1  ALBUMIN 3.8    Recent Labs Lab 12/10/15 2117  LIPASE 23   No results for input(s): AMMONIA in the last 168 hours. CBC:  Recent Labs Lab 12/10/15 2117 12/12/15 0217  WBC 9.4 6.5  NEUTROABS 6.5  --   HGB 10.4* 9.4*  HCT 33.6* 31.5*  MCV 67.1*  66.7*  PLT 365 335   Cardiac Enzymes: No results for input(s): CKTOTAL, CKMB, CKMBINDEX, TROPONINI in the last 168 hours. BNP: Invalid input(s): POCBNP CBG: No results for input(s): GLUCAP in the last 168 hours.  Significant Diagnostic Studies:  Dg Chest 2 View  Result Date: 12/10/2015 CLINICAL DATA:  Patient with right-sided chest pain. EXAM: CHEST  2 VIEW COMPARISON:  None. FINDINGS: The heart size and mediastinal contours are within normal limits. Both lungs are clear. The visualized skeletal structures are unremarkable. IMPRESSION: No active cardiopulmonary disease. Electronically Signed   By: Lovey Newcomer M.D.   On: 12/10/2015 21:50   Ct Angio Chest Pe W/cm &/or Wo Cm  Result Date: 12/11/2015 CLINICAL DATA:  Inspiratory chest pain.  Recent myomectomy. EXAM: CT ANGIOGRAPHY CHEST WITH CONTRAST TECHNIQUE: Multidetector CT imaging of the chest was performed using the standard protocol during  bolus administration of intravenous contrast. Multiplanar CT image reconstructions and MIPs were obtained to evaluate the vascular anatomy. CONTRAST:  60 cc Isovue 370 IV COMPARISON:  Chest radiographs yesterday FINDINGS: Cardiovascular: There is an isolated subsegmental filling defect in the right lower lobe series 6, image 158. This is slightly eccentric within the vessel. Decreased opacification of the subsegmental branches to the lateral right lower lobe without discrete filling defect. Heart is normal in size, no right heart strain. Normal thoracic aorta without dissection. Mediastinum/Nodes: No adenopathy.  No pericardial fluid. Lungs/Pleura: Small focal peripheral opacity in the anterior right lower lobe abuts the pleura. The lungs are otherwise clear. No pulmonary edema. No pleural fluid. Upper Abdomen: No acute abnormality. Musculoskeletal: There are no acute or suspicious osseous abnormalities. Review of the MIP images confirms the above findings. IMPRESSION: 1. Subsegmental pulmonary embolus in the  right lower lobe, acuity uncertain. No right heart strain. 2. Small focal peripheral opacity in the lateral right lower lobe. No discrete filling defects are seen within the subsegmental pulmonary arteries to this region, however there is diminished opacification in the segmental pulmonary artery, suspect this is a a small focal pulmonary infarct. Critical Value/emergent results were called by telephone at the time of interpretation on 12/11/2015 at 1:48 am to [place] , who verbally acknowledged these results. Electronically Signed   By: Jeb Levering M.D.   On: 12/11/2015 01:51   US Abdomen Limited Ruq  Result Date: 12/10/2015 CLINICAL DATA:  RIGHT upper quadrant pain with breathing for 1 day. EXAM: US ABDOMEN LIMITED - RIGHT UPPER QUADRANT COMPARISON:  None. FINDINGS: Gallbladder: Contracted post prandial gallbladder. No gallbladder wall thickening or pericholecystic fluid. No sonographic Murphy's sign elicited. 5 mm mural based echogenicity without acoustic shadowing or comet tail artifact. Common bile duct: Diameter: 3 mm Liver: No focal lesion identified. Within normal limits in parenchymal echogenicity. Hepatopetal portal vein. IMPRESSION: No acute RIGHT upper quadrant process. 5 mm gallbladder polyp versus sludge. Electronically Signed   By: Elon Alas M.D.   On: 12/10/2015 23:38    2D ECHO: Study Conclusions  - Left ventricle: The cavity size was normal. Wall thickness was   normal. Systolic function was normal. The estimated ejection   fraction was in the range of 60% to 65%. Wall motion was normal;   there were no regional wall motion abnormalities. Left   ventricular diastolic function parameters were normal. - Mitral valve: Mildly calcified anterior leaflet.   Disposition and Follow-up: Discharge Instructions    Diet general    Complete by:  As directed   Discharge instructions    Complete by:  As directed   Take XARELTO as directed on package: Start with one  15mg  tablet by mouth twice a day with food. On Day 22 (01/03/16), switch to one 20mg  tablet once a day with food.   Increase activity slowly    Complete by:  As directed       DISPOSITION: home    DISCHARGE FOLLOW-UP Follow-up Information    Lottie Dawson, MD. Schedule an appointment as soon as possible for a visit in 2 week(s).   Specialties:  Internal Medicine, Pediatrics Contact information: [place] [address] [phone]            Time spent on Discharge: 64 MINS   Signed:   RAI,RIPUDEEP M.D. [facility] 12/12/2015, 11:46 AM Pager: [phone]

## 2015-12-12 NOTE — Care Management Note (Signed)
Case Management Note Marvetta Gibbons RN, BSN Unit 2W-Case Manager [phone]  Patient Details  Name: Jessica Dudley MRN: [id number] Date of Birth: 03/10/1976  Subjective/Objective:   Pt admitted with PE                 Action/Plan: PTA pt lived at home- independent- referral received for NOAC benefit check for Eliquis vs Xarelto- copay for both is $25- per MD will go with Xarelto for discharge- XARELTO 20 DAILY (30 )  COVER- YES  CO-PAY- $ 25.00  TIER- 2 DRUG  PRIOR APPROVAL- NO   Spoke with pt at bedside- copay shared- and copay assist card given to pt to use on discharge.   Expected Discharge Date:     12/12/15             Expected Discharge Plan:  Home/Self Care  In-House Referral:     Discharge planning Services  CM Consult, Medication Assistance  Post Acute Care Choice:    Choice offered to:     DME Arranged:    DME Agency:     HH Arranged:    HH Agency:     Status of Service:  Completed, signed off  If discussed at [employer] of Stay Meetings, dates discussed:    Additional Comments:  Dawayne Patricia, RN 12/12/2015, 1:41 PM

## 2015-12-12 NOTE — Progress Notes (Signed)
Insurance check completed on both Eliquis and Xarelto S/W SHERRI @ [employer] [facility] # [phone]   ELIQUIS 5 MG BID (30 )  COVER- YES  CO-PAY- $25.00 60 TAB  TIER- 2 DRUG  PRIOR APPROVAL- NO   XARELTO 20 DAILY (30 )  COVER- YES  CO-PAY- $ 25.00  TIER- 2 DRUG  PRIOR APPROVAL- NO   PHARMACY: [facility]

## 2015-12-13 NOTE — Discharge Summary (Signed)
GYN Discharge Summary     Patient Name: Jessica Dudley DOB: [birth year] MRN: [id number]  Date of admission: 11/29/2015  Date of discharge: 11/30/2015  Admitting diagnosis: UTERINE FIBROIDS   Secondary diagnosis:  Active Problems:   Leiomyoma  Additional problems: Anemia     Discharge diagnosis: Status post abdominal myomectomy                                                                               Complications: None  Hospital course:  Patient underwent abdominal myomectomy without complications and was observed overnight for analgesic administration and postop close monitoring. She ambulated with some assistance postoperatively. Vitamin size remain stable. Her diet was easily advanced to regular diet postop.  Physical exam  Vitals:   11/29/15 2135 11/30/15 0155 11/30/15 0610 11/30/15 1348  BP: 125/84 118/79 119/68 116/74  Pulse: 99 90 (!) 104 80  Resp: 16 18 18 20   Temp: 98.4 F (36.9 C) 99.3 F (37.4 C) 99.3 F (37.4 C) 99.2 F (37.3 C)  TempSrc: Oral Oral Oral Oral  SpO2: 100% 100% 100% 97%  Weight:      Height:       General: alert, cooperative and no distress Abdomen: Soft, diffusely tender, bowel sounds present Incision: Healing well with no significant drainage DVT Evaluation: No evidence of DVT seen on physical exam. Labs: Lab Results  Component Value Date   WBC 6.5 12/12/2015   HGB 9.4 (L) 12/12/2015   HCT 31.5 (L) 12/12/2015   MCV 66.7 (L) 12/12/2015   PLT 335 12/12/2015   CMP Latest Ref Rng & Units 12/12/2015  Glucose 65 - 99 mg/dL 104(H)  BUN 6 - 20 mg/dL 9  Creatinine 0.44 - 1.00 mg/dL 0.71  Sodium 135 - 145 mmol/L 139  Potassium 3.5 - 5.1 mmol/L 4.0  Chloride 101 - 111 mmol/L 109  CO2 22 - 32 mmol/L 25  Calcium 8.9 - 10.3 mg/dL 9.0  Total Protein 6.5 - 8.1 g/dL -  Total Bilirubin 0.3 - 1.2 mg/dL -  Alkaline Phos 38 - 126 U/L -  AST 15 - 41 U/L -  ALT 14 - 54 U/L -    Discharge instruction: per After Visit Summary   After visit  meds:    Medication List    TAKE these medications   acetaminophen 500 MG tablet Commonly known as:  TYLENOL Take 1,000 mg by mouth every 6 (six) hours as needed for headache.   ALEVE 220 MG Caps Generic drug:  Naproxen Sodium Take 440 mg by mouth as needed (pain).   ibuprofen 800 MG tablet Commonly known as:  ADVIL,MOTRIN Take 1 tablet (800 mg total) by mouth every 8 (eight) hours as needed (mild pain).   Iron (Ferrous Gluconate) 256 (28 Fe) MG Tabs Take 256 mg by mouth daily.   MAG-OX 400 PO Take 400 mg by mouth daily.   MULTIPLE VITAMIN PO Take 1 tablet by mouth daily.   ondansetron 4 MG tablet Commonly known as:  ZOFRAN Take 1 tablet (4 mg total) by mouth every 6 (six) hours as needed for nausea.   vitamin C 500 MG tablet Commonly known as:  ASCORBIC ACID Take 500 mg  by mouth daily.       Diet: routine diet  Activity: Advance as tolerated. Pelvic rest for 2 weeks.   Outpatient follow up:on Monday in Dr Charlett Lango office  Follow up Appt:Future Appointments Date Time Provider [place]  12/26/2015 1:45 PM Burnis Medin, MD [facility]  02/15/2016 8:45 AM [facility]  02/22/2016 3:45 PM Burnis Medin, MD [facility]   Follow up Visit: 12/05/15 with Dr Kerin Perna   12/13/2015 Governor Specking, MD

## 2015-12-19 ENCOUNTER — Encounter: Payer: Self-pay | Admitting: Family Medicine

## 2015-12-19 ENCOUNTER — Ambulatory Visit (INDEPENDENT_AMBULATORY_CARE_PROVIDER_SITE_OTHER)
Admission: RE | Admit: 2015-12-19 | Discharge: 2015-12-19 | Disposition: A | Discharge status: Home / Self Care | Payer: BLUE CROSS/BLUE SHIELD | Source: Ambulatory Visit | Attending: Family Medicine | Admitting: Family Medicine

## 2015-12-19 ENCOUNTER — Ambulatory Visit (INDEPENDENT_AMBULATORY_CARE_PROVIDER_SITE_OTHER): Payer: BLUE CROSS/BLUE SHIELD | Admitting: Family Medicine

## 2015-12-19 ENCOUNTER — Telehealth: Payer: Self-pay | Admitting: Internal Medicine

## 2015-12-19 VITALS — BP 124/82 | HR 86 | Temp 98.6°F | Resp 16 | Ht 61.5 in | Wt 141.7 lb

## 2015-12-19 DIAGNOSIS — Z7251 High risk heterosexual behavior: Secondary | ICD-10-CM

## 2015-12-19 DIAGNOSIS — M25511 Pain in right shoulder: Secondary | ICD-10-CM

## 2015-12-19 DIAGNOSIS — D509 Iron deficiency anemia, unspecified: Secondary | ICD-10-CM | POA: Diagnosis not present

## 2015-12-19 DIAGNOSIS — R0602 Shortness of breath: Secondary | ICD-10-CM | POA: Diagnosis not present

## 2015-12-19 LAB — CBC WITH DIFFERENTIAL/PLATELET
Basophils Absolute: 0 10*3/uL (ref 0.0–0.1)
Basophils Relative: 0.7 % (ref 0.0–3.0)
Eosinophils Absolute: 0 10*3/uL (ref 0.0–0.7)
Eosinophils Relative: 0.6 % (ref 0.0–5.0)
HCT: 33.6 % — ABNORMAL LOW (ref 36.0–46.0)
Hemoglobin: 10.6 g/dL — ABNORMAL LOW (ref 12.0–15.0)
Lymphocytes Relative: 26.9 % (ref 12.0–46.0)
Lymphs Abs: 1.5 10*3/uL (ref 0.7–4.0)
MCHC: 31.6 g/dL (ref 30.0–36.0)
MCV: 65.4 fl — ABNORMAL LOW (ref 78.0–100.0)
Monocytes Absolute: 0.7 10*3/uL (ref 0.1–1.0)
Monocytes Relative: 11.8 % (ref 3.0–12.0)
Neutro Abs: 3.3 10*3/uL (ref 1.4–7.7)
Neutrophils Relative %: 60 % (ref 43.0–77.0)
Platelets: 325 10*3/uL (ref 150.0–400.0)
RBC: 5.14 Mil/uL — ABNORMAL HIGH (ref 3.87–5.11)
RDW: 18.4 % — ABNORMAL HIGH (ref 11.5–15.5)
WBC: 5.5 10*3/uL (ref 4.0–10.5)

## 2015-12-19 LAB — BASIC METABOLIC PANEL
BUN: 11 mg/dL (ref 6–23)
CO2: 30 mEq/L (ref 19–32)
Calcium: 10 mg/dL (ref 8.4–10.5)
Chloride: 103 mEq/L (ref 96–112)
Creatinine, Ser: 0.85 mg/dL (ref 0.40–1.20)
GFR: 95.67 mL/min (ref >60.00)
Glucose, Bld: 65 mg/dL — ABNORMAL LOW (ref 70–99)
Potassium: 4.1 mEq/L (ref 3.5–5.1)
Sodium: 139 mEq/L (ref 135–145)

## 2015-12-19 LAB — IBC PANEL
Iron: 112 ug/dL (ref 42–145)
Saturation Ratios: 28.8 % (ref 20.0–50.0)
Transferrin: 278 mg/dL (ref 212.0–360.0)

## 2015-12-19 LAB — FERRITIN: Ferritin: 19.2 ng/mL (ref 10.0–291.0)

## 2015-12-19 LAB — POCT URINE PREGNANCY: Preg Test, Ur: NEGATIVE

## 2015-12-19 NOTE — Patient Instructions (Signed)
We have ordered labs or studies at this visit. It can take up to 1-2 weeks for results and processing. IF results require follow up or explanation, we will call you with instructions. Clinically stable results will be released to your MYCHART. If you have not heard from us or cannot find your results in MYCHART in 2 weeks please contact our office at [phone].  If you are not yet signed up for MYCHART, please consider signing up.        

## 2015-12-19 NOTE — Progress Notes (Signed)
Pre visit review using our clinic review tool, if applicable. No additional management support is needed unless otherwise documented below in the visit note. 

## 2015-12-19 NOTE — Progress Notes (Signed)
Subjective:    Patient ID: Jessica Dudley, female    DOB: 18-Feb-1976, 40 y.o.   MRN: [id number]  HPI  Jessica Dudley is a 40 year old female who presents today with discomfort "tension" in her right shoulder that she feels with a deep breath.  She only notes this pain when she takes a deep breath.  Pain with deep breathing rated as a 3 and noted as discomfort only. She reports taking 2 acetaminophen tablets which has provided moderate benefit as discomfort is improving at this visit.  She denies SOB and only notes this as mild SOB only if she takes a deep breath. She denies chest pain, palpitations, numbness, dizziness, lightheadedness, or weakness. She also denies DOE, pleuritic pain, cough, orthopnea, calf/thigh pain, wheezing, or hemoptysis.She reports fatigue stating that she did not sleep well after feeling right shoulder pain which improved with sitting up and using pillows which provided moderate benefit. She denies the need for pillows to rest at night and reports that she noted "sleeping in an uncomfortable position"  ED to hospital admission on 12/10/2015 where she ws treated for a suspected acute PE provoked by recent surgery and subsequent immobility. CT indicated associated small pulmonary infarct in lateral RLL. She was given Lovenox 1mg /kg in the ED and then started on xarelto.  Doppler US LE negative for DVT; echo EF 60-65%; no RV strain.  She was also noted to have chronic microcytic anemia which is suspected to be iron deficiency anemia due to history of menorrhagia and uterine fibroids.  She had a recent myomectomy and her Hbg has improved and there is no s/s of active blood-loss.     Review of Systems  Constitutional: Negative for chills, fatigue and fever.  Eyes: Negative for visual disturbance.  Respiratory: Negative for cough, shortness of breath and wheezing.   Cardiovascular: Negative for chest pain, palpitations and leg swelling.  Gastrointestinal: Negative for abdominal  pain, blood in stool, diarrhea, nausea and vomiting.  Musculoskeletal: Negative for arthralgias and myalgias.  Skin: Negative for pallor and rash.  Neurological: Negative for dizziness, light-headedness and headaches.  Psychiatric/Behavioral:       Denies depression or anxiety but reports feeling "worried" about her symptoms and she is here today for evaluation to "make sure things are okay"   Past Medical History:  Diagnosis Date  . Iron deficiency anemia   . Uterine fibroid   . Wears glasses      Social History   Social History  . Marital status: Single    Spouse name: N/A  . Number of children: N/A  . Years of education: N/A   Occupational History  . Not on file.   Social History Main Topics  . Smoking status: Never Smoker  . Smokeless tobacco: Never Used  . Alcohol use Yes     Comment: occasional  . Drug use: No  . Sexual activity: Yes    Birth control/ protection: None   Other Topics Concern  . Not on file   Social History Narrative   Divorced   Regular exercise- no   HH of 2 with daughter no pets       Age 57 daughter    Works [employer]   40 hours    MOM ruby [place]    Past Surgical History:  Procedure Laterality Date  . CHROMOPERTUBATION N/A 11/29/2015   Procedure: ABDOMINAL MYOMECTOMY;  Surgeon: Governor Specking, MD;  Location: [facility];  Service: Gynecology;  Laterality: N/A;  .  MYOMECTOMY    . NO PAST SURGERIES      Family History  Problem Relation Age of Onset  . Hypertension Mother   . Prostate cancer Paternal Uncle   . Diabetes Maternal Grandmother   . Leukemia Cousin   . Brain cancer Cousin   . Deep vein thrombosis Father   . Varicose Veins Father     Allergies  Allergen Reactions  . Norco [Hydrocodone-Acetaminophen] Nausea And Vomiting  . Oxycodone-Acetaminophen Nausea And Vomiting    Current Outpatient Prescriptions on File Prior to Visit  Medication Sig Dispense Refill  . acetaminophen (TYLENOL) 500 MG tablet Take  1,000 mg by mouth every 6 (six) hours as needed for headache.     . Iron, Ferrous Gluconate, 256 (28 Fe) MG TABS Take 256 mg by mouth daily.     . Magnesium Oxide (MAG-OX 400 PO) Take 400 mg by mouth daily.    . MULTIPLE VITAMIN PO Take 1 tablet by mouth daily.     Derrill Memo ON 01/03/2016] rivaroxaban (XARELTO) 20 MG TABS tablet Take 1 tablet (20 mg total) by mouth daily with supper. 30 tablet 4  . Rivaroxaban 15 & 20 MG TBPK Take as directed on package: Start with one 15mg  tablet by mouth twice a day with food. On Day 22, 01/03/16, switch to one 20mg  tablet once a day with food. 51 each 0  . valACYclovir (VALTREX) 500 MG tablet Take 500 mg by mouth 2 (two) times daily. For 3-5 days for outbreak  1  . vitamin C (ASCORBIC ACID) 500 MG tablet Take 500 mg by mouth daily.    Marland Kitchen ibuprofen (ADVIL,MOTRIN) 800 MG tablet Take 1 tablet (800 mg total) by mouth every 8 (eight) hours as needed (mild pain). (Patient not taking: Reported on 12/19/2015) 30 tablet 0  . Naproxen Sodium (ALEVE) 220 MG CAPS Take 440 mg by mouth as needed (pain).      No current facility-administered medications on file prior to visit.     BP 124/82 (BP Location: Right Arm, Patient Position: Sitting, Cuff Size: Normal)   Pulse 86   Temp 98.6 F (37 C) (Oral)   Resp 16   Ht 5' 1.5" (1.562 m)   Wt 141 lb 11.2 oz (64.3 kg)   LMP 11/29/2015   SpO2 99%   BMI 26.34 kg/m       Objective:   Physical Exam  Constitutional: She is oriented to person, place, and time. She appears well-developed and well-nourished.  Eyes: Pupils are equal, round, and reactive to light. No scleral icterus.  Neck: Neck supple.  Cardiovascular: Normal rate, regular rhythm and intact distal pulses.   Pulmonary/Chest: Effort normal and breath sounds normal. She has no wheezes. She has no rales.  Abdominal: Soft. Bowel sounds are normal. There is no tenderness.  Lymphadenopathy:    She has no cervical adenopathy.  Neurological: She is alert and oriented to  person, place, and time.  Skin: Skin is warm and dry. No rash noted.  Psychiatric: She has a normal mood and affect. Her behavior is normal. Judgment and thought content normal.       Assessment & Plan:   1. Right shoulder pain Shoulder discomfort noted that is associated with deep breathing; while this has improved and reported to not be at the same level of discomfort prior to her recent ED to hospital admission a chest x-ray will be obtained which can be compared to her most recent image that was obtained on 12/10/2015.  Suspect discomfort is related to resolving small inferior infarct of RLL and have a low suspicion for cardiac etiology or new PE. Patient is currently taking xarelto without report of bleeding and she is not tachypnic and oxygen saturation is 99%.  Advised patient to follow up with Dr. Regis Bill for her scheduled hospital follow up and reviewed this case today with Dr. Regis Bill prior to ordering labs and imaging. Further advised patient to seek immediate medical attention if she has DOE, pleuritic pain, cough, orthopnea, calf/thigh pain, wheezing, or hemoptysis. Patient voiced understanding and agreed with plan.  - DG Chest 2 View; Future  2. Anemia, iron deficiency Lab work will be obtained to follow up Hbg and recent anemia diagnosis - CBC with Differential/Platelet - Basic metabolic panel - Ferritin - IBC Panel  3. Unprotected sexual intercourse Pregnancy test obtained to rule out pregnancy prior to imaging. - POCT urine pregnancy  Follow up in one week for hospital follow up visit.  Delano Metz, FNP-C

## 2015-12-19 NOTE — Telephone Encounter (Signed)
Patient has an appointment today at 11am.

## 2015-12-19 NOTE — Telephone Encounter (Signed)
Patient Name: Jessica Dudley DOB: 1975-08-08 Initial Comment Caller states she was in hospital for blood clot, still having SOB. Nurse Assessment Nurse: Ronnald Ramp, RN, Miranda Date/Time (Eastern Time): 12/19/2015 9:09:52 AM Confirm and document reason for call. If symptomatic, describe symptoms. You must click the next button to save text entered. ---Caller states she was in the hospital 1 week ago for a PE and continues to have SOB. Has the patient traveled out of the country within the last 30 days? ---Not Applicable Does the patient have any new or worsening symptoms? ---Yes Will a triage be completed? ---Yes Related visit to physician within the last 2 weeks? ---Yes Does the PT have any chronic conditions? (i.e. diabetes, asthma, etc.) ---Yes List chronic conditions. ---Uterine fibroid surgery in July. Is the patient pregnant or possibly pregnant? (Ask all females between the ages of 67-55) ---No Is this a behavioral health or substance abuse call? ---No Guidelines Guideline Title Affirmed Question Affirmed Notes Breathing Difficulty [1] MILD difficulty breathing (e.g., minimal/no SOB at rest, SOB with walking, pulse <100) AND [2] NEW-onset or WORSE than normal Final Disposition User See Physician within 4 Hours (or PCP triage) Ronnald Ramp, RN, Miranda Comments Pt is being treated for the PE and not having any worsening symptoms. She is just not sure if she should be continuing to have periods of SOB. No appt available with PCP for today, appt scheduled with Almira Coaster, NP at 11am. Referrals REFERRED TO PCP OFFICE Disagree/Comply: Comply

## 2015-12-19 NOTE — Telephone Encounter (Signed)
Noted  

## 2015-12-23 NOTE — Progress Notes (Signed)
Pre visit review using our clinic review tool, if applicable. No additional management support is needed unless otherwise documented below in the visit note.  Chief Complaint  Patient presents with  . Hospitalization Follow-up    HPI: Jessica Dudley 40 y.o. comes in today for follow-up of hospitalization for right sided small submental pulmonary embolus with possible infarction. She was post op for a GYN procedure. When she developed chest pain shortness of breath on the right side right under her rib cage that radiated to her shoulder. She was evaluated placed on oral anticoagulants and here for fu .  No bleeding  Was not on ocps . NO right heart strain has gyne   post op   6 week appt.    Out of work for another 3 weeks post op .  She is better from visit last week with JK  About shoulder  X ray clear labs as below fam hx  Had one in LEG    .     Has VV   .  Was not on   ocps .     To talk with obgyne  About future contraception was considering pregnancy. "   Uterus has to heal  . " She is taking iron  ROS: See pertinent positives and negatives per HPI. Fatigue some dec exercise tolerance  Had hg a1c in high 5 range recently   Past Medical History:  Diagnosis Date  . Iron deficiency anemia   . Uterine fibroid   . Wears glasses     Family History  Problem Relation Age of Onset  . Hypertension Mother   . Prostate cancer Paternal Uncle   . Diabetes Maternal Grandmother   . Leukemia Cousin   . Brain cancer Cousin   . Deep vein thrombosis Father   . Varicose Veins Father     Social History   Social History  . Marital status: Single    Spouse name: N/A  . Number of children: N/A  . Years of education: N/A   Social History Main Topics  . Smoking status: Never Smoker  . Smokeless tobacco: Never Used  . Alcohol use Yes     Comment: occasional  . Drug use: No  . Sexual activity: Yes    Birth control/ protection: None   Other Topics Concern  . None   Social History  Narrative   Divorced   Regular exercise- no   HH of 2 with daughter no pets       Age 75 daughter    Works [employer]   40 hours    MOM ruby [place]    Outpatient Medications Prior to Visit  Medication Sig Dispense Refill  . acetaminophen (TYLENOL) 500 MG tablet Take 1,000 mg by mouth every 6 (six) hours as needed for headache.     . ibuprofen (ADVIL,MOTRIN) 800 MG tablet Take 1 tablet (800 mg total) by mouth every 8 (eight) hours as needed (mild pain). 30 tablet 0  . Iron, Ferrous Gluconate, 256 (28 Fe) MG TABS Take 256 mg by mouth daily.     . Magnesium Oxide (MAG-OX 400 PO) Take 400 mg by mouth daily.    . MULTIPLE VITAMIN PO Take 1 tablet by mouth daily.     . Naproxen Sodium (ALEVE) 220 MG CAPS Take 440 mg by mouth as needed (pain).     Derrill Memo ON 01/03/2016] rivaroxaban (XARELTO) 20 MG TABS tablet Take 1 tablet (20 mg total) by mouth daily with  supper. 30 tablet 4  . Rivaroxaban 15 & 20 MG TBPK Take as directed on package: Start with one 15mg  tablet by mouth twice a day with food. On Day 22, 01/03/16, switch to one 20mg  tablet once a day with food. 51 each 0  . valACYclovir (VALTREX) 500 MG tablet Take 500 mg by mouth 2 (two) times daily. For 3-5 days for outbreak  1  . vitamin C (ASCORBIC ACID) 500 MG tablet Take 500 mg by mouth daily.     No facility-administered medications prior to visit.      EXAM:  BP 116/72 (BP Location: Right Arm, Patient Position: Sitting, Cuff Size: Normal)   Temp 98.7 F (37.1 C) (Oral)   Wt 139 lb 9.6 oz (63.3 kg)   LMP 11/29/2015   BMI 25.95 kg/m   Body mass index is 25.95 kg/m.  GENERAL: vitals reviewed and listed above, alert, oriented, appears well hydrated and in no acute distress HEENT: atraumatic, conjunctiva  clear, no obvious abnormalities on inspection of external nose and ears OP : no lesion edema or exudate  NECK: no obvious masses on inspection palpation  LUNGS: clear to auscultation bilaterally, no wheezes, rales or rhonchi, good air  movement CV: HRRR, no clubbing cyanosis or  peripheral edema nl cap refill  Abdomen:  Sof,t normal bowel sounds without hepatosplenomegaly, no guarding rebound or masses no CVA tenderness MS: moves all extremities without noticeable focal  Abnormality legs no edema local  tenderness PSYCH: pleasant and cooperative, no obvious depression or anxiety Lab Results  Component Value Date   WBC 5.5 12/19/2015   HGB 10.6 (L) 12/19/2015   HCT 33.6 (L) 12/19/2015   PLT 325.0 12/19/2015   GLUCOSE 65 (L) 12/19/2015   CHOL 159 05/14/2011   TRIG 37.0 05/14/2011   HDL 50.20 05/14/2011   LDLCALC 101 (H) 05/14/2011   ALT 15 12/10/2015   AST 14 (L) 12/10/2015   NA 139 12/19/2015   K 4.1 12/19/2015   CL 103 12/19/2015   CREATININE 0.85 12/19/2015   BUN 11 12/19/2015   CO2 30 12/19/2015   TSH 0.79 05/14/2011   INR 1.08 12/11/2015    IMPRESSION: 1. Subsegmental pulmonary embolus in the right lower lobe, acuity uncertain. No right heart strain. 2. Small focal peripheral opacity in the lateral right lower lobe. No discrete filling defects are seen within the subsegmental pulmonary arteries to this region, however there is diminished opacification in the segmental pulmonary artery, suspect this is a a small focal pulmonary infarct. Critical Value/emergent results were called by telephone at the time of interpretation on 12/11/2015 at 1:48 am to [place] , who verbally acknowledged these results.   Electronically Signed   By: Jeb Levering M.D.   On: 12/11/2015 01:51  Summary:  - No evidence of deep vein or superficial thrombosis involving the   right lower extremity and left lower extremity. - No evidence of Baker&'s cyst on the right or left.  Other specific details can be found in the table(s) above. Prepared and Electronically Authenticated by  Gae Gallop MD 2017-08-06T15:05:00 ASSESSMENT AND PLAN:  Discussed the following assessment and plan:  PE  (pulmonary thromboembolism) ([place]) - disc expectant management  risk beneoft of meds  bleeding etc advise [facility] discharge follow-up  Anticoagulant long-term use anemia up form 9.6 Father had DVT and varicose veins. She had decided clotting do not think needs evaluation for hereditary thrombophilia. At this time continue for 6 months and reevaluate. Follow-up  in 2 months at her [facility]. Have the pharmacy or her call when needs a refill xeralto  Shoulder pain resolved  Has minimal residual.   sob but is post op and anemic also  . Activity as tolerated   Risk benefit of medication discussed seek care if bleeding or head injury etc . -Patient advised to return or notify health care team  if symptoms worsen ,persist or new concerns arise.  Patient Instructions  Continue iron supplementation.   Stay on anticoagulation.   As planned . The discomfort  May be there for a wheil but should get better with time .    Keep appt in 2 months and will recheck .     Standley Brooking. Panosh M.D.

## 2015-12-26 ENCOUNTER — Ambulatory Visit (INDEPENDENT_AMBULATORY_CARE_PROVIDER_SITE_OTHER): Payer: BLUE CROSS/BLUE SHIELD | Admitting: Internal Medicine

## 2015-12-26 ENCOUNTER — Encounter: Payer: Self-pay | Admitting: Internal Medicine

## 2015-12-26 VITALS — BP 116/72 | Temp 98.7°F | Wt 139.6 lb

## 2015-12-26 DIAGNOSIS — Z09 Encounter for follow-up examination after completed treatment for conditions other than malignant neoplasm: Secondary | ICD-10-CM | POA: Diagnosis not present

## 2015-12-26 DIAGNOSIS — I2699 Other pulmonary embolism without acute cor pulmonale: Secondary | ICD-10-CM

## 2015-12-26 DIAGNOSIS — Z7901 Long term (current) use of anticoagulants: Secondary | ICD-10-CM

## 2015-12-26 NOTE — Patient Instructions (Signed)
Continue iron supplementation.   Stay on anticoagulation.   As planned . The discomfort  May be there for a wheil but should get better with time .    Keep appt in 2 months and will recheck .

## 2016-01-05 DIAGNOSIS — Z3141 Encounter for fertility testing: Secondary | ICD-10-CM | POA: Diagnosis not present

## 2016-01-05 DIAGNOSIS — Z319 Encounter for procreative management, unspecified: Secondary | ICD-10-CM | POA: Diagnosis not present

## 2016-02-15 ENCOUNTER — Other Ambulatory Visit (INDEPENDENT_AMBULATORY_CARE_PROVIDER_SITE_OTHER): Payer: BLUE CROSS/BLUE SHIELD

## 2016-02-15 DIAGNOSIS — Z0000 Encounter for general adult medical examination without abnormal findings: Secondary | ICD-10-CM

## 2016-02-15 LAB — BASIC METABOLIC PANEL
BUN: 13 mg/dL (ref 6–23)
CO2: 25 mEq/L (ref 19–32)
Calcium: 9.4 mg/dL (ref 8.4–10.5)
Chloride: 107 mEq/L (ref 96–112)
Creatinine, Ser: 0.96 mg/dL (ref 0.40–1.20)
GFR: 83.07 mL/min (ref >60.00)
Glucose, Bld: 80 mg/dL (ref 70–99)
Potassium: 4.3 mEq/L (ref 3.5–5.1)
Sodium: 139 mEq/L (ref 135–145)

## 2016-02-15 LAB — TSH: TSH: 0.93 u[IU]/mL (ref 0.35–4.50)

## 2016-02-15 LAB — CBC WITH DIFFERENTIAL/PLATELET
Basophils Absolute: 0 10*3/uL (ref 0.0–0.1)
Basophils Relative: 0.8 % (ref 0.0–3.0)
Eosinophils Absolute: 0 10*3/uL (ref 0.0–0.7)
Eosinophils Relative: 0.8 % (ref 0.0–5.0)
HCT: 33.4 % — ABNORMAL LOW (ref 36.0–46.0)
Hemoglobin: 10.7 g/dL — ABNORMAL LOW (ref 12.0–15.0)
Lymphocytes Relative: 46.2 % — ABNORMAL HIGH (ref 12.0–46.0)
Lymphs Abs: 1.6 10*3/uL (ref 0.7–4.0)
MCHC: 32 g/dL (ref 30.0–36.0)
MCV: 64.8 fl — ABNORMAL LOW (ref 78.0–100.0)
Monocytes Absolute: 0.6 10*3/uL (ref 0.1–1.0)
Monocytes Relative: 17 % — ABNORMAL HIGH (ref 3.0–12.0)
Neutro Abs: 1.2 10*3/uL — ABNORMAL LOW (ref 1.4–7.7)
Neutrophils Relative %: 35.2 % — ABNORMAL LOW (ref 43.0–77.0)
Platelets: 278 10*3/uL (ref 150.0–400.0)
RBC: 5.16 Mil/uL — ABNORMAL HIGH (ref 3.87–5.11)
RDW: 15.4 % (ref 11.5–15.5)
WBC: 3.5 10*3/uL — ABNORMAL LOW (ref 4.0–10.5)

## 2016-02-15 LAB — HEPATIC FUNCTION PANEL
ALT: 13 U/L (ref 0–35)
AST: 12 U/L (ref 0–37)
Albumin: 4 g/dL (ref 3.5–5.2)
Alkaline Phosphatase: 37 U/L — ABNORMAL LOW (ref 39–117)
Bilirubin, Direct: 0.1 mg/dL (ref 0.0–0.3)
Total Bilirubin: 0.5 mg/dL (ref 0.2–1.2)
Total Protein: 6.6 g/dL (ref 6.0–8.3)

## 2016-02-15 LAB — LIPID PANEL
Cholesterol: 165 mg/dL (ref 0–200)
HDL: 59.3 mg/dL (ref >39.00)
LDL Cholesterol: 96 mg/dL (ref 0–99)
NonHDL: 105.25
Total CHOL/HDL Ratio: 3
Triglycerides: 46 mg/dL (ref 0.0–149.0)
VLDL: 9.2 mg/dL (ref 0.0–40.0)

## 2016-02-22 ENCOUNTER — Ambulatory Visit (INDEPENDENT_AMBULATORY_CARE_PROVIDER_SITE_OTHER): Payer: BLUE CROSS/BLUE SHIELD | Admitting: Internal Medicine

## 2016-02-22 ENCOUNTER — Encounter: Payer: Self-pay | Admitting: Internal Medicine

## 2016-02-22 VITALS — BP 102/64 | Temp 98.5°F | Ht 61.0 in | Wt 137.8 lb

## 2016-02-22 DIAGNOSIS — D509 Iron deficiency anemia, unspecified: Secondary | ICD-10-CM | POA: Diagnosis not present

## 2016-02-22 DIAGNOSIS — Z0000 Encounter for general adult medical examination without abnormal findings: Secondary | ICD-10-CM

## 2016-02-22 DIAGNOSIS — Z7901 Long term (current) use of anticoagulants: Secondary | ICD-10-CM

## 2016-02-22 DIAGNOSIS — Z23 Encounter for immunization: Secondary | ICD-10-CM | POA: Diagnosis not present

## 2016-02-22 DIAGNOSIS — Z86711 Personal history of pulmonary embolism: Secondary | ICD-10-CM | POA: Diagnosis not present

## 2016-02-22 MED ORDER — VALACYCLOVIR HCL 500 MG PO TABS: 500.0000 mg | ORAL_TABLET | Freq: Two times a day (BID) | ORAL | 5 refills | Status: AC

## 2016-02-22 NOTE — Progress Notes (Signed)
Pre visit review using our clinic review tool, if applicable. No additional management support is needed unless otherwise documented below in the visit note.  Chief Complaint  Patient presents with  . Annual Exam    HPI: Patient  Jessica Dudley  40 y.o. comes in today for Preventive Health Care visit   On anticoagulation for  Hx small PE noted  Early August   Was 1 weeks post  Short term  Surgery for fibroid but may have had ocps pre surgery ?  Her specialist advised that she may need to be on shots of blood thinner if gets pregnant and should have eval for genetic  hypercoag state.   Generally well to be married [id number]   .  Back to work .  No cough sob . Bleeding except periods last 7 days 2-3 days heavy taking vit d with iron  And no nsaids .   Health Maintenance  Topic Date Due  . PAP SMEAR  05/29/2017 (Originally 05/30/2015)  . TETANUS/TDAP  03/01/2019  . INFLUENZA VACCINE  Completed  . HIV Screening  Completed   Health Maintenance Review LIFESTYLE:  Exercise:    walking   Tobacco/ETS: no Alcohol:  social Sugar beverages:  Mostly water . lemonade ocass Sleep:  About   11 - 7  Drug use: no  HH of  2-3  Work:  40  To get married nov 18th     ROS:  GEN/ HEENT: No fever, significant weight changes sweats headaches vision problems hearing changes, gets has with menses  CV/ PULM; No chest pain shortness of breath cough, syncope,edema  change in exercise tolerance. GI /GU: No adominal pain, vomiting, change in bowel habits. No blood in the stool. No significant GU symptoms. SKIN/HEME: ,no acute skin rashes suspicious lesions or bleeding. No lymphadenopathy, nodules, masses.  NEURO/ PSYCH:  No neurologic signs such as weakness numbness. No depression anxiety. IMM/ Allergy: No unusual infections.  Allergy .   REST of 12 system review negative except as per HPI   Past Medical History:  Diagnosis Date  . Iron deficiency anemia   . Uterine fibroid   . Wears glasses     Past  Surgical History:  Procedure Laterality Date  . CHROMOPERTUBATION N/A 11/29/2015   Procedure: ABDOMINAL MYOMECTOMY;  Surgeon: Governor Specking, MD;  Location: [facility];  Service: Gynecology;  Laterality: N/A;  . MYOMECTOMY    . NO PAST SURGERIES      Family History  Problem Relation Age of Onset  . Hypertension Mother   . Prostate cancer Paternal Uncle   . Diabetes Maternal Grandmother   . Leukemia Cousin   . Brain cancer Cousin   . Deep vein thrombosis Father   . Varicose Veins Father     Social History   Social History  . Marital status: Single    Spouse name: N/A  . Number of children: N/A  . Years of education: N/A   Social History Main Topics  . Smoking status: Never Smoker  . Smokeless tobacco: Never Used  . Alcohol use Yes     Comment: occasional  . Drug use: No  . Sexual activity: Yes    Birth control/ protection: None   Other Topics Concern  . None   Social History Narrative   Divorced   Regular exercise- no   HH of 2 with daughter no pets       Age 70 daughter    Works [employer]   1  hours    MOM ruby [place]       EXAM:  BP 102/64 (BP Location: Left Arm, Patient Position: Sitting, Cuff Size: Normal)   Temp 98.5 F (36.9 C) (Oral)   Ht 5\' 1"  (1.549 m)   Wt 137 lb 12.8 oz (62.5 kg)   LMP 01/28/2016 (Approximate)   BMI 26.04 kg/m   Body mass index is 26.04 kg/m.  Physical Exam: Vital signs reviewed [id number] is a well-developed well-nourished alert cooperative    who appearsr stated age in no acute distress.  HEENT: normocephalic atraumatic , Eyes: PERRL EOM's full, conjunctiva clear, Nares: paten,t no deformity discharge or tenderness., Ears: no deformity EAC's clear TMs with normal landmarks. Mouth: clear OP, no lesions, edema.  Moist mucous membranes. Dentition in adequate repair. NECK: supple without masses, thyromegaly or bruits. CHEST/PULM:  Clear to auscultation and percussion breath sounds equal no wheeze , rales or  rhonchi. No chest wall deformities or tenderness. CV: PMI is nondisplaced, S1 S2 no gallops, murmurs, rubs. Peripheral pulses are full without delay.No JVD .  ABDOMEN: Bowel sounds normal nontender  No guard or rebound, no hepato splenomegal no CVA tenderness.  No hernia. Extremtities:  No clubbing cyanosis or edema, no acute joint swelling or redness no focal atrophy NEURO:  Oriented x3, cranial nerves 3-12 appear to be intact, no obvious focal weakness,gait within normal limits no abnormal reflexes or asymmetrical SKIN: No acute rashes normal turgor, color, no bruising or petechiae. PSYCH: Oriented, good eye contact, no obvious depression anxiety, cognition and judgment appear normal. LN: no cervical axillary inguinal adenopathy  Lab Results  Component Value Date   WBC 3.5 (L) 02/15/2016   HGB 10.7 (L) 02/15/2016   HCT 33.4 (L) 02/15/2016   PLT 278.0 02/15/2016   GLUCOSE 80 02/15/2016   CHOL 165 02/15/2016   TRIG 46.0 02/15/2016   HDL 59.30 02/15/2016   LDLCALC 96 02/15/2016   ALT 13 02/15/2016   AST 12 02/15/2016   NA 139 02/15/2016   K 4.3 02/15/2016   CL 107 02/15/2016   CREATININE 0.96 02/15/2016   BUN 13 02/15/2016   CO2 25 02/15/2016   TSH 0.93 02/15/2016   INR 1.08 12/11/2015   ibc 28  ASSESSMENT AND PLAN:  Discussed the following assessment and plan:  Visit for preventive health examination  Anticoagulant long-term use - Plan: Ambulatory referral to Internal Medicine  Iron deficiency anemia, unspecified iron deficiency anemia type - Plan: Ambulatory referral to Internal Medicine  Hx pulmonary embolism - Plan: Ambulatory referral to Internal Medicine  Need for prophylactic vaccination and inoculation against influenza - Plan: Flu Vaccine QUAD 36+ mos PF IM (Fluarix & Fluzone Quad PF) Not taking  nsaids  just tylenol  For ha peri menstrual   Keep appt jan  But consult before then hopefully.  Patient Care Team: Burnis Medin, MD as PCP - General Christophe Louis, MD  as Attending Physician (Obstetrics and Gynecology) Princess Bruins, MD as Consulting Physician (Obstetrics and Gynecology) Patient Instructions  Continue the iron and vitamin C anticoagulation for 6 months after her pulmonary embolus was initially treated. We'll do a referral and arrange a consult with hematology Dr. Beryle Beams  in regard to future risk regarding clotting risk pregnancy further testing for hyper coagulable  Condition.   You  should be contact about this . appt .  Plan ROV in    ;late January     In follow up or as needed.   Congratulations on your up coming  wedding .         Standley Brooking. Panosh M.D. \   Medication List       Accurate as of 02/22/16  5:27 PM. Always use your most recent med list.          acetaminophen 500 MG tablet Commonly known as:  TYLENOL Take 1,000 mg by mouth every 6 (six) hours as needed for headache.   Iron (Ferrous Gluconate) 256 (28 Fe) MG Tabs Take 256 mg by mouth daily.   MAG-OX 400 PO Take 400 mg by mouth daily.   MULTIPLE VITAMIN PO Take 1 tablet by mouth daily.   rivaroxaban 20 MG Tabs tablet Commonly known as:  XARELTO Take 1 tablet (20 mg total) by mouth daily with supper.   valACYclovir 500 MG tablet Commonly known as:  VALTREX Take 1 tablet (500 mg total) by mouth 2 (two) times daily. For 3-5 days for outbreak   vitamin C 500 MG tablet Commonly known as:  ASCORBIC ACID Take 500 mg by mouth daily.

## 2016-02-22 NOTE — Patient Instructions (Addendum)
Continue the iron and vitamin C anticoagulation for 6 months after her pulmonary embolus was initially treated. We'll do a referral and arrange a consult with hematology Dr. Beryle Beams  in regard to future risk regarding clotting risk pregnancy further testing for hyper coagulable  Condition.   You  should be contact about this . appt .  Plan ROV in    ;late January     In follow up or as needed.   Congratulations on your up coming wedding .

## 2016-04-09 ENCOUNTER — Ambulatory Visit (INDEPENDENT_AMBULATORY_CARE_PROVIDER_SITE_OTHER): Payer: BLUE CROSS/BLUE SHIELD | Admitting: Oncology

## 2016-04-09 ENCOUNTER — Encounter: Payer: Self-pay | Admitting: Oncology

## 2016-04-09 ENCOUNTER — Encounter (INDEPENDENT_AMBULATORY_CARE_PROVIDER_SITE_OTHER): Payer: Self-pay

## 2016-04-09 VITALS — BP 113/61 | HR 88 | Temp 98.9°F | Ht 61.5 in | Wt 139.4 lb

## 2016-04-09 DIAGNOSIS — Z8042 Family history of malignant neoplasm of prostate: Secondary | ICD-10-CM

## 2016-04-09 DIAGNOSIS — Z833 Family history of diabetes mellitus: Secondary | ICD-10-CM | POA: Diagnosis not present

## 2016-04-09 DIAGNOSIS — I2699 Other pulmonary embolism without acute cor pulmonale: Secondary | ICD-10-CM | POA: Diagnosis not present

## 2016-04-09 DIAGNOSIS — D509 Iron deficiency anemia, unspecified: Secondary | ICD-10-CM

## 2016-04-09 DIAGNOSIS — Z885 Allergy status to narcotic agent status: Secondary | ICD-10-CM

## 2016-04-09 DIAGNOSIS — Z832 Family history of diseases of the blood and blood-forming organs and certain disorders involving the immune mechanism: Secondary | ICD-10-CM | POA: Diagnosis not present

## 2016-04-09 DIAGNOSIS — Z808 Family history of malignant neoplasm of other organs or systems: Secondary | ICD-10-CM

## 2016-04-09 DIAGNOSIS — Z8249 Family history of ischemic heart disease and other diseases of the circulatory system: Secondary | ICD-10-CM

## 2016-04-09 DIAGNOSIS — R350 Frequency of micturition: Secondary | ICD-10-CM | POA: Diagnosis not present

## 2016-04-09 NOTE — Patient Instructions (Addendum)
To lab today Return visit 12/19

## 2016-04-09 NOTE — Progress Notes (Signed)
New Patient Hematology   LOGYN WOOLRIDGE [id number] 1976-03-12 40 y.o. 04/09/2016  CC: Dr. Shanon Ace; Dr. Kerin Perna; Dr Jodelle Gross   Reason for referral:  Advice on anticoagulation in a young woman with a recent provoked, low volume, right lower lobe pulmonary embolus who will be undergoing an attempt at in vitro fertilization.   HPI:  Pleasant 40 year old woman who has had irregular periods since her menarche in the seventh grade. She was put on hormonal therapy to regulate her cycle when she was about 40 years old. She first became pregnant at age 80. She has a healthy 9 year old daughter. No pregnancy complications. Vaginal delivery. Subsequent to that pregnancy she has developed menorrhagia with severe iron deficiency. She has been on a number of contraceptives including a Mirena IUD, an oral preparation called Junel which was one of the few oral preparations she tolerated, injectable medication called Avadril? for a few months prior to having an abdominal myomectomy to resect fibroids on 11/29/2015. Just a few weeks after this procedure she was admitted to [facility] on August 5 with acute onset of dyspnea and right paretic chest pain and was found to have a low volume, subsegmental vessel, right lower lobe pulmonary embolus. She is currently on Xarelto anticoagulation. Vascular Doppler studies of her lower extremities did not reveal a source for the pulmonary embolism. An echocardiogram was normal. Ejection fraction 60-65 percent. No evidence for right ventricular strain. No obvious PFO or ASD but I do not see that a bubble study was done.  She has no signs or symptoms of a collagen vascular disorder and specifically denies polyarthralgia, polymyalgias, skin rash. She gets an occasional sinus headache. She was getting more headaches when she was on some of the oral contraceptive preparations. She is not obese. She does not smoke. She has had problems with painful ovarian cysts  which occur at time of her cycle. She does not clearly have the polycystic ovary syndrome.  The only person in her family who had a blood clot was her father; this occurred when he was in his late 40s/early 41s and felt to be related to varicose veins. Her mother, a sister, and 3 brothers have not had problems with blood clots. Her sister has 2 children and had no problems with her pregnancies.   PMH: Past Medical History:  Diagnosis Date  . Iron deficiency anemia   . Uterine fibroid   . Wears glasses     Past Surgical History:  Procedure Laterality Date  . CHROMOPERTUBATION N/A 11/29/2015   Procedure: ABDOMINAL MYOMECTOMY;  Surgeon: Governor Specking, MD;  Location: [facility];  Service: Gynecology;  Laterality: N/A;  . MYOMECTOMY    . NO PAST SURGERIES      Allergies: Allergies  Allergen Reactions  . Norco [Hydrocodone-Acetaminophen] Nausea And Vomiting  . Oxycodone-Acetaminophen Nausea And Vomiting    Medications:  Current Outpatient Prescriptions:  .  acetaminophen (TYLENOL) 500 MG tablet, Take 1,000 mg by mouth every 6 (six) hours as needed for headache. , Disp: , Rfl:  .  Iron, Ferrous Gluconate, 256 (28 Fe) MG TABS, Take 256 mg by mouth daily. , Disp: , Rfl:  .  Magnesium Oxide (MAG-OX 400 PO), Take 400 mg by mouth daily., Disp: , Rfl:  .  MULTIPLE VITAMIN PO, Take 1 tablet by mouth daily. , Disp: , Rfl:  .  rivaroxaban (XARELTO) 20 MG TABS tablet, Take 1 tablet (20 mg total) by mouth daily with supper., Disp: 30 tablet, Rfl:  4 .  valACYclovir (VALTREX) 500 MG tablet, Take 1 tablet (500 mg total) by mouth 2 (two) times daily. For 3-5 days for outbreak, Disp: 30 tablet, Rfl: 5 .  vitamin C (ASCORBIC ACID) 500 MG tablet, Take 500 mg by mouth daily., Disp: , Rfl:   Social History: Divorced. Remarried and her husband accompanies her. 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 She is a Vegan but does eat fish.  She has never smoked. She  drinks a rare alcoholic beverage socially. No recreational drugs.  Family History: Family History  Problem Relation Age of Onset  . Hypertension Mother   . Prostate cancer Paternal Uncle   . Diabetes Maternal Grandmother   . Leukemia Cousin   . Brain cancer Cousin   . Deep vein thrombosis Father   . Varicose Veins Father   Mother with no health issues. Older sister and 3 younger brothers. No one else has had blood clots.  Review of Systems: See history of present illness Remaining ROS negative.  Physical Exam: Blood pressure 113/61, pulse 88, temperature 98.9 F (37.2 C), temperature source Oral, height 5' 1.5" (1.562 m), weight 139 lb 6.4 oz (63.2 kg), SpO2 100 %. Wt Readings from Last 3 Encounters:  04/09/16 139 lb 6.4 oz (63.2 kg)  02/22/16 137 lb 12.8 oz (62.5 kg)  12/26/15 139 lb 9.6 oz (63.3 kg)     General appearance: Pleasant, well-nourished, African-American woman looking younger than her stated age HENNT: Pharynx no erythema, exudate, mass, or ulcer. No thyromegaly or thyroid nodules Lymph nodes: No cervical, supraclavicular, or axillary lymphadenopathy Breasts:  Lungs: Clear to auscultation, resonant to percussion throughout Heart: Regular rhythm, no murmur, no gallop, no rub, no click, no edema Abdomen: Soft, nontender, normal bowel sounds, no mass, no organomegaly Extremities: No edema, no calf tenderness Musculoskeletal: no joint deformities GU:  Vascular: Carotid pulses 2+, no bruits Neurologic: Alert, oriented, PERRLA, optic discs sharp and vessels normal, no hemorrhage or exudate, cranial nerves grossly normal, motor strength 5 over 5, reflexes 1+ symmetric, upper body coordination normal, gait normal, Skin: No rash or ecchymosis    Lab Results: Lab Results  Component Value Date   WBC 3.5 (L) 02/15/2016   HGB 10.7 (L) 02/15/2016   HCT 33.4 (L) 02/15/2016   MCV 64.8 Repeated and verified X2. (L) 02/15/2016   PLT 278.0 02/15/2016     Chemistry       Component Value Date/Time   NA 139 02/15/2016 0831   K 4.3 02/15/2016 0831   CL 107 02/15/2016 0831   CO2 25 02/15/2016 0831   BUN 13 02/15/2016 0831   CREATININE 0.96 02/15/2016 0831      Component Value Date/Time   CALCIUM 9.4 02/15/2016 0831   ALKPHOS 37 (L) 02/15/2016 0831   AST 12 02/15/2016 0831   ALT 13 02/15/2016 0831   BILITOT 0.5 02/15/2016 0831         Radiological Studies: CT angiogram results as noted in history of present illness     Impression: Low volume pulmonary embolus in a single subsegmental blood vessel, right lower lobe.  Likely provoked as it occurred within just 12 days of an abdominal myomectomy although no gross source found in the leg veins. No other major risk factors for blood clots except, perhaps, for intermittent oral contraceptive use. She is now at 3 months on Xarelto. If she did not desire to get pregnant, I would discontinue anticoagulation at this point. This decision would be  made independent of whatever laboratory studies that could be done to exclude congenital coagulopathy except for high titer antiphospholipid antibodies . Given the fact that pregnancy itself is a risk factor for thrombosis, the fact that she was told that she would require a cesarean section for delivery,  her age, and the fact that she had a recent, albeit low volume, pulmonary embolism, I would recommend that when she starts to receive hormonal injections as part of the in vitro fertilization process, that she continues on the Xarelto which should be stopped immediately if she becomes pregnant. She should then  be maintained on full dose low molecular weight heparin anticoagulation for the duration of the pregnancy and for 8 weeks postpartum. Subsequent to that, I do not feel she needs to be on long term anticoagulation unless she develops a new event. I am going to screen her for antiphospholipid antibody syndrome, factor V Leiden and the prothrombin gene mutations, and  antithrombin III deficiency. Lupus anticoagulant, protein S, protein C, cannot be reliably assessed on Xarelto or warfarin anticoagulation. I will try to communicate with her other physicians to make sure everybody is in agreement with this recommendation before prescribing the Lovenox.    Murriel Hopper, MD, [place]  Hematology-Oncology/Internal Medicine  04/09/2016, 4:07 PM

## 2016-04-10 LAB — CBC WITH DIFFERENTIAL/PLATELET
Basophils Absolute: 0 10*3/uL (ref 0.0–0.2)
Basos: 0 %
EOS (ABSOLUTE): 0 10*3/uL (ref 0.0–0.4)
Eos: 0 %
Hematocrit: 33.6 % — ABNORMAL LOW (ref 34.0–46.6)
Hemoglobin: 10 g/dL — ABNORMAL LOW (ref 11.1–15.9)
Immature Grans (Abs): 0 10*3/uL (ref 0.0–0.1)
Immature Granulocytes: 0 %
Lymphocytes Absolute: 1.8 10*3/uL (ref 0.7–3.1)
Lymphs: 26 %
MCH: 19.3 pg — ABNORMAL LOW (ref 26.6–33.0)
MCHC: 29.8 g/dL — ABNORMAL LOW (ref 31.5–35.7)
MCV: 65 fL — ABNORMAL LOW (ref 79–97)
Monocytes Absolute: 0.5 10*3/uL (ref 0.1–0.9)
Monocytes: 7 %
Neutrophils Absolute: 4.4 10*3/uL (ref 1.4–7.0)
Neutrophils: 67 %
Platelets: 295 10*3/uL (ref 150–379)
RBC: 5.17 x10E6/uL (ref 3.77–5.28)
RDW: 17 % — ABNORMAL HIGH (ref 12.3–15.4)
WBC: 6.7 10*3/uL (ref 3.4–10.8)

## 2016-04-10 LAB — BETA-2-GLYCOPROTEIN I ABS, IGG/M/A
Beta-2 Glyco 1 IgA: <9 GPI IgA units (ref 0–25)
Beta-2 Glyco 1 IgM: <9 GPI IgM units (ref 0–32)
Beta-2 Glyco I IgG: <9 GPI IgG units (ref 0–20)

## 2016-04-10 LAB — ANTITHROMBIN III: AntiThromb III Func: 102 % (ref 75–135)

## 2016-04-10 LAB — CARDIOLIPIN ANTIBODIES, IGG, IGM, IGA
Anticardiolipin IgA: <9 APL U/mL (ref 0–11)
Anticardiolipin IgG: <9 GPL U/mL (ref 0–14)
Anticardiolipin IgM: <9 MPL U/mL (ref 0–12)

## 2016-04-10 LAB — FERRITIN: Ferritin: 12 ng/mL — ABNORMAL LOW (ref 15–150)

## 2016-04-13 LAB — PROTHROMBIN GENE MUTATION

## 2016-04-13 LAB — FACTOR 5 LEIDEN

## 2016-04-16 ENCOUNTER — Telehealth: Payer: Self-pay | Admitting: *Deleted

## 2016-04-16 NOTE — Telephone Encounter (Signed)
Pt called and informed "tests we did to look for inherited clotting problems and antibodies to clotting factors came back normal. I called Dr Kerin Perna and spoke with one of his partners - he was out of town. Now I am away until Wednesday so I will have to try again next week. For now, stay on Xarelto." per Dr Beryle Beams. Pt voiced understanding.

## 2016-04-16 NOTE — Telephone Encounter (Signed)
-----   Message from Annia Belt, MD sent at 04/13/2016  5:44 PM EST ----- Call pt: tests we did to look for inherited clotting problems and antibodies to clotting factors came back normal. I called Dr Kerin Perna and spoke with one of his partners - he was out of town. Now I am away until Wednesday so I will have to try again next week. For now, stay on Xarelto.

## 2016-05-08 ENCOUNTER — Telehealth: Payer: Self-pay | Admitting: *Deleted

## 2016-05-08 NOTE — Telephone Encounter (Signed)
Pt called / informed Dr Darnell Level has called and waiting to hear back from him. Pt voiced understanding.

## 2016-05-08 NOTE — Telephone Encounter (Signed)
-----   Message from Annia Belt, MD sent at 05/08/2016  2:50 PM EST ----- Let her know I called his office again and I am waiting to hear back from him ----- Message ----- From: Ebbie Latus, RN Sent: 05/08/2016  12:12 PM To: Annia Belt, MD  Pt wants to know if u had a chance to talk to the specialist about her condition.  Her phone# [phone]. Thanks

## 2016-05-08 NOTE — Telephone Encounter (Signed)
Call from Thu at the [facility] [place] - pt had left message at there office. Message -Pt wants to know if u had a chance to talk to the specialist about her condition.  Her phone# [phone]. Thanks

## 2016-05-16 NOTE — Progress Notes (Signed)
Pre visit review using our clinic review tool, if applicable. No additional management support is needed unless otherwise documented below in the visit note.  Chief Complaint  Patient presents with  . Sinus Pain/Pressure    X4days  . Nasal Congestion  . Headache  . Generalized Body Aches  . Hoarse    HPI: Jessica Dudley 41 y.o.  sda appt  Has had 4 days of  Sx    Sneezing congestion nose burning sinus  And face is puffy  this am crusty eyes .  ? No fever   No allergy  Gets sinus every year.  Usually otc waits it out but lasting longer  Took otc sinus med some temp relief and med last night   No hemoptysis  Nose bleed  Clear drainage  Nse.  Seems like was getting better and the yesterday worse   4 days of sx.   And to go to  Recently married .  consdiering   Childbirth   And dr g  Felt  To go off meds.  At that time  And then   If on pregnant then lovenox and 6 weeks after.  Work with Emergency planning/management officer.  ROS: See pertinent positives and negatives per HPI. No cp sob some cough   Past Medical History:  Diagnosis Date  . Iron deficiency anemia   . Uterine fibroid   . Wears glasses     Family History  Problem Relation Age of Onset  . Hypertension Mother   . Prostate cancer Paternal Uncle   . Diabetes Maternal Grandmother   . Leukemia Cousin   . Brain cancer Cousin   . Deep vein thrombosis Father   . Varicose Veins Father     Social History   Social History  . Marital status: Single    Spouse name: N/A  . Number of children: N/A  . Years of education: N/A   Social History Main Topics  . Smoking status: Never Smoker  . Smokeless tobacco: Never Used  . Alcohol use Yes     Comment: occasional  . Drug use: No  . Sexual activity: Yes    Birth control/ protection: None   Other Topics Concern  . None   Social History Narrative   Divorced   Regular exercise- no   HH of 2 with daughter no pets       Age 25 daughter    Works [employer]   40 hours    MOM ruby Mckeithan    Outpatient  Medications Prior to Visit  Medication Sig Dispense Refill  . acetaminophen (TYLENOL) 500 MG tablet Take 1,000 mg by mouth every 6 (six) hours as needed for headache.     . Iron, Ferrous Gluconate, 256 (28 Fe) MG TABS Take 256 mg by mouth daily.     . Magnesium Oxide (MAG-OX 400 PO) Take 400 mg by mouth daily.    . MULTIPLE VITAMIN PO Take 1 tablet by mouth daily.     . rivaroxaban (XARELTO) 20 MG TABS tablet Take 1 tablet (20 mg total) by mouth daily with supper. 30 tablet 4  . valACYclovir (VALTREX) 500 MG tablet Take 1 tablet (500 mg total) by mouth 2 (two) times daily. For 3-5 days for outbreak 30 tablet 5  . vitamin C (ASCORBIC ACID) 500 MG tablet Take 500 mg by mouth daily.     No facility-administered medications prior to visit.      EXAM:  BP 112/74 (BP Location: Right Arm, Patient  Position: Sitting, Cuff Size: Normal)   Temp 98.3 F (36.8 C) (Oral)   Wt 142 lb (64.4 kg)   BMI 26.40 kg/m   Body mass index is 26.4 kg/m. WDWN in NAD  quiet respirations; miod  congested  somewhat hoarse. Non toxic . Som puffiness non red or warm   HEENT: Normocephalic ;atraumatic , Eyes;  PERRL, EOMs  Full, lids and conjunctiva clear,,Ears: no deformities, canals nl, TM landmarks normal, Nose: no deformity or discharge but congested;face minimally tender  Ethmoid  Nasaparanasal area Mouth : OP clear without lesion or edema .mild erythema  Neck: Supple without adenopathy or masses or bruits Chest:  Clear to A without wheezes rales or rhonchi CV:  S1-S2 no gallops or murmurs peripheral perfusion is normal Skin :nl perfusion and no acute rashes  No bruising   ASSESSMENT AND PLAN:  Discussed the following assessment and plan:  Acute respiratory infection  Other acute sinusitis, recurrence not specified - viral vs other  see text  Anticoagulant long-term use Will keep next week appt for fu and more to follow up on   Anticoagulation   Plan    Expectant management.  -Patient advised to return  or notify health care team  if symptoms worsen ,persist or new concerns arise.  Patient Instructions  This could be a viral respiratory infection with sinus involvement. Most of these get better on their own with saline decongestants and time. However sometimes bacteria will make the problem worse and antibiotics are helpful.   use nasal saline sprays; frequently to help decongest the nose. Add Mucinex 1 in the day for decongestant warm compresses. If the puffiness of the face is getting worse or facial pain add the antibiotic. Keep your appointment next week.    Sinusitis, Adult Sinusitis is soreness and inflammation of your sinuses. Sinuses are hollow spaces in the bones around your face. Your sinuses are located:  Around your eyes.  In the middle of your forehead.  Behind your nose.  In your cheekbones. Your sinuses and nasal passages are lined with a stringy fluid (mucus). Mucus normally drains out of your sinuses. When your nasal tissues become inflamed or swollen, the mucus can become trapped or blocked so air cannot flow through your sinuses. This allows bacteria, viruses, and funguses to grow, which leads to infection. Sinusitis can develop quickly and last for 7?10 days (acute) or for more than 12 weeks (chronic). Sinusitis often develops after a cold. What are the causes? This condition is caused by anything that creates swelling in the sinuses or stops mucus from draining, including:  Allergies.  Asthma.  Bacterial or viral infection.  Abnormally shaped bones between the nasal passages.  Nasal growths that contain mucus (nasal polyps).  Narrow sinus openings.  Pollutants, such as chemicals or irritants in the air.  A foreign object stuck in the nose.  A fungal infection. This is rare. What increases the risk? The following factors may make you more likely to develop this condition:  Having allergies or asthma.  Having had a recent cold or respiratory tract  infection.  Having structural deformities or blockages in your nose or sinuses.  Having a weak immune system.  Doing a lot of swimming or diving.  Overusing nasal sprays.  Smoking. What are the signs or symptoms? The main symptoms of this condition are pain and a feeling of pressure around the affected sinuses. Other symptoms include:  Upper toothache.  Earache.  Headache.  Bad breath.  Decreased sense  of smell and taste.  A cough that may get worse at night.  Fatigue.  Fever.  Thick drainage from your nose. The drainage is often green and it may contain pus (purulent).  Stuffy nose or congestion.  Postnasal drip. This is when extra mucus collects in the throat or back of the nose.  Swelling and warmth over the affected sinuses.  Sore throat.  Sensitivity to light. How is this diagnosed? This condition is diagnosed based on symptoms, a medical history, and a physical exam. To find out if your condition is acute or chronic, your health care provider may:  Look in your nose for signs of nasal polyps.  Tap over the affected sinus to check for signs of infection.  View the inside of your sinuses using an imaging device that has a light attached (endoscope). If your health care provider suspects that you have chronic sinusitis, you may also:  Be tested for allergies.  Have a sample of mucus taken from your nose (nasal culture) and checked for bacteria.  Have a mucus sample examined to see if your sinusitis is related to an allergy. If your sinusitis does not respond to treatment and it lasts longer than 8 weeks, you may have an MRI or CT scan to check your sinuses. These scans also help to determine how severe your infection is. In rare cases, a bone biopsy may be done to rule out more serious types of fungal sinus disease. How is this treated? Treatment for sinusitis depends on the cause and whether your condition is chronic or acute. If a virus is causing your  sinusitis, your symptoms will go away on their own within 10 days. You may be given medicines to relieve your symptoms, including:  Topical nasal decongestants. They shrink swollen nasal passages and let mucus drain from your sinuses.  Antihistamines. These drugs block inflammation that is triggered by allergies. This can help to ease swelling in your nose and sinuses.  Topical nasal corticosteroids. These are nasal sprays that ease inflammation and swelling in your nose and sinuses.  Nasal saline washes. These rinses can help to get rid of thick mucus in your nose. If your condition is caused by bacteria, you will be given an antibiotic medicine. If your condition is caused by a fungus, you will be given an antifungal medicine. Surgery may be needed to correct underlying conditions, such as narrow nasal passages. Surgery may also be needed to remove polyps. Follow these instructions at home: Medicines  Take, use, or apply over-the-counter and prescription medicines only as told by your health care provider. These may include nasal sprays.  If you were prescribed an antibiotic medicine, take it as told by your health care provider. Do not stop taking the antibiotic even if you start to feel better. Hydrate and Humidify  Drink enough water to keep your urine clear or pale yellow. Staying hydrated will help to thin your mucus.  Use a cool mist humidifier to keep the humidity level in your home above 50%.  Inhale steam for 10-15 minutes, 3-4 times a day or as told by your health care provider. You can do this in the bathroom while a hot shower is running.  Limit your exposure to cool or dry air. Rest  Rest as much as possible.  Sleep with your head raised (elevated).  Make sure to get enough sleep each night. General instructions  Apply a warm, moist washcloth to your face 3-4 times a day or as told  by your health care provider. This will help with discomfort.  Wash your hands often  with soap and water to reduce your exposure to viruses and other germs. If soap and water are not available, use hand sanitizer.  Do not smoke. Avoid being around people who are smoking (secondhand smoke).  Keep all follow-up visits as told by your health care provider. This is important. Contact a health care provider if:  You have a fever.  Your symptoms get worse.  Your symptoms do not improve within 10 days. Get help right away if:  You have a severe headache.  You have persistent vomiting.  You have pain or swelling around your face or eyes.  You have vision problems.  You develop confusion.  Your neck is stiff.  You have trouble breathing. This information is not intended to replace advice given to you by your health care provider. Make sure you discuss any questions you have with your health care provider. Document Released: 04/23/2005 Document Revised: 12/18/2015 Document Reviewed: 02/16/2015 Elsevier Interactive Patient Education  2017 Mitchell K. Panosh M.D.

## 2016-05-17 ENCOUNTER — Encounter: Payer: Self-pay | Admitting: Oncology

## 2016-05-17 ENCOUNTER — Encounter: Payer: Self-pay | Admitting: Internal Medicine

## 2016-05-17 ENCOUNTER — Ambulatory Visit (INDEPENDENT_AMBULATORY_CARE_PROVIDER_SITE_OTHER): Payer: BLUE CROSS/BLUE SHIELD | Admitting: Internal Medicine

## 2016-05-17 VITALS — BP 112/74 | Temp 98.3°F | Wt 142.0 lb

## 2016-05-17 DIAGNOSIS — J22 Unspecified acute lower respiratory infection: Secondary | ICD-10-CM

## 2016-05-17 DIAGNOSIS — Z7901 Long term (current) use of anticoagulants: Secondary | ICD-10-CM | POA: Diagnosis not present

## 2016-05-17 DIAGNOSIS — J018 Other acute sinusitis: Secondary | ICD-10-CM

## 2016-05-17 MED ORDER — AMOXICILLIN-POT CLAVULANATE 875-125 MG PO TABS: 1.0000 | ORAL_TABLET | Freq: Two times a day (BID) | ORAL | 0 refills | Status: DC

## 2016-05-17 NOTE — Progress Notes (Signed)
Phone conversation with patient on 05/16/2016: 41 year old woman who sustained a small pulmonary embolus in a right subsegmental pulmonary artery in August, 2017. She has been on Xarelto anticoagulation for over 3 months. This was a provoked event which occurred within a few weeks of a myomectomy procedure. In addition, I believe she has been on intermittent hormonal therapy to stimulate ovulation. I was able to discuss her situation with her current fertility expert, Dr.Yalcinkaya. I did some baseline screening tests that could be done while she was on Xarelto which shows that she tests negative for the prothrombin gene and factor V Leiden gene mutations, no elevation of anticardiolipin antibodies or antibodies to beta 2 glycoprotein 1, and a normal antithrombin III level.  Disposition: Advised the patient that it would be safe to stop the anticoagulants at this time. Resume once she starts an in vitro protocol. Stopped for 24 hours prior to ache retrieval and resume the next day. Once pregnancy established, discontinue the oral anticoagulant and change to low molecular weight heparin. Given the low volume of the clot, the provoked nature of the clot, and the time since the event, the negative "hypercoagulation" workup, I'm inclined to just use prophylactic dose Lovenox 40 mg daily and not therapeutic dose. The patient is due to see Dr. Darreld Mclean next week. She will then schedule a follow-up visit with me if she is about to start an in vitro program so that I can coordinate efforts with Dr. Darreld Mclean.

## 2016-05-17 NOTE — Patient Instructions (Signed)
This could be a viral respiratory infection with sinus involvement. Most of these get better on their own with saline decongestants and time. However sometimes bacteria will make the problem worse and antibiotics are helpful.   use nasal saline sprays; frequently to help decongest the nose. Add Mucinex 1 in the day for decongestant warm compresses. If the puffiness of the face is getting worse or facial pain add the antibiotic. Keep your appointment next week.    Sinusitis, Adult Sinusitis is soreness and inflammation of your sinuses. Sinuses are hollow spaces in the bones around your face. Your sinuses are located:  Around your eyes.  In the middle of your forehead.  Behind your nose.  In your cheekbones. Your sinuses and nasal passages are lined with a stringy fluid (mucus). Mucus normally drains out of your sinuses. When your nasal tissues become inflamed or swollen, the mucus can become trapped or blocked so air cannot flow through your sinuses. This allows bacteria, viruses, and funguses to grow, which leads to infection. Sinusitis can develop quickly and last for 7?10 days (acute) or for more than 12 weeks (chronic). Sinusitis often develops after a cold. What are the causes? This condition is caused by anything that creates swelling in the sinuses or stops mucus from draining, including:  Allergies.  Asthma.  Bacterial or viral infection.  Abnormally shaped bones between the nasal passages.  Nasal growths that contain mucus (nasal polyps).  Narrow sinus openings.  Pollutants, such as chemicals or irritants in the air.  A foreign object stuck in the nose.  A fungal infection. This is rare. What increases the risk? The following factors may make you more likely to develop this condition:  Having allergies or asthma.  Having had a recent cold or respiratory tract infection.  Having structural deformities or blockages in your nose or sinuses.  Having a weak  immune system.  Doing a lot of swimming or diving.  Overusing nasal sprays.  Smoking. What are the signs or symptoms? The main symptoms of this condition are pain and a feeling of pressure around the affected sinuses. Other symptoms include:  Upper toothache.  Earache.  Headache.  Bad breath.  Decreased sense of smell and taste.  A cough that may get worse at night.  Fatigue.  Fever.  Thick drainage from your nose. The drainage is often green and it may contain pus (purulent).  Stuffy nose or congestion.  Postnasal drip. This is when extra mucus collects in the throat or back of the nose.  Swelling and warmth over the affected sinuses.  Sore throat.  Sensitivity to light. How is this diagnosed? This condition is diagnosed based on symptoms, a medical history, and a physical exam. To find out if your condition is acute or chronic, your health care provider may:  Look in your nose for signs of nasal polyps.  Tap over the affected sinus to check for signs of infection.  View the inside of your sinuses using an imaging device that has a light attached (endoscope). If your health care provider suspects that you have chronic sinusitis, you may also:  Be tested for allergies.  Have a sample of mucus taken from your nose (nasal culture) and checked for bacteria.  Have a mucus sample examined to see if your sinusitis is related to an allergy. If your sinusitis does not respond to treatment and it lasts longer than 8 weeks, you may have an MRI or CT scan to check your sinuses. These scans  also help to determine how severe your infection is. In rare cases, a bone biopsy may be done to rule out more serious types of fungal sinus disease. How is this treated? Treatment for sinusitis depends on the cause and whether your condition is chronic or acute. If a virus is causing your sinusitis, your symptoms will go away on their own within 10 days. You may be given medicines to  relieve your symptoms, including:  Topical nasal decongestants. They shrink swollen nasal passages and let mucus drain from your sinuses.  Antihistamines. These drugs block inflammation that is triggered by allergies. This can help to ease swelling in your nose and sinuses.  Topical nasal corticosteroids. These are nasal sprays that ease inflammation and swelling in your nose and sinuses.  Nasal saline washes. These rinses can help to get rid of thick mucus in your nose. If your condition is caused by bacteria, you will be given an antibiotic medicine. If your condition is caused by a fungus, you will be given an antifungal medicine. Surgery may be needed to correct underlying conditions, such as narrow nasal passages. Surgery may also be needed to remove polyps. Follow these instructions at home: Medicines  Take, use, or apply over-the-counter and prescription medicines only as told by your health care provider. These may include nasal sprays.  If you were prescribed an antibiotic medicine, take it as told by your health care provider. Do not stop taking the antibiotic even if you start to feel better. Hydrate and Humidify  Drink enough water to keep your urine clear or pale yellow. Staying hydrated will help to thin your mucus.  Use a cool mist humidifier to keep the humidity level in your home above 50%.  Inhale steam for 10-15 minutes, 3-4 times a day or as told by your health care provider. You can do this in the bathroom while a hot shower is running.  Limit your exposure to cool or dry air. Rest  Rest as much as possible.  Sleep with your head raised (elevated).  Make sure to get enough sleep each night. General instructions  Apply a warm, moist washcloth to your face 3-4 times a day or as told by your health care provider. This will help with discomfort.  Wash your hands often with soap and water to reduce your exposure to viruses and other germs. If soap and water are  not available, use hand sanitizer.  Do not smoke. Avoid being around people who are smoking (secondhand smoke).  Keep all follow-up visits as told by your health care provider. This is important. Contact a health care provider if:  You have a fever.  Your symptoms get worse.  Your symptoms do not improve within 10 days. Get help right away if:  You have a severe headache.  You have persistent vomiting.  You have pain or swelling around your face or eyes.  You have vision problems.  You develop confusion.  Your neck is stiff.  You have trouble breathing. This information is not intended to replace advice given to you by your health care provider. Make sure you discuss any questions you have with your health care provider. Document Released: 04/23/2005 Document Revised: 12/18/2015 Document Reviewed: 02/16/2015 Elsevier Interactive Patient Education  2017 [employer].

## 2016-05-18 NOTE — Progress Notes (Signed)
Pre visit review using our clinic review tool, if applicable. No additional management support is needed unless otherwise documented below in the visit note.  Chief Complaint  Patient presents with  . Follow-up    HPI: Jessica Dudley 41 y.o. comes in today for discussion about continuing or discontinuing her anticoagulation after a small pulmonary embolus related to GYN surgery.   She is also following up from sinusitis upper respiratory infection that we saw her for last week. She didn't need to take the antibiotic and is improving   Taking Decongestant .    No fever.   She has seen Dr. Jim Desanctis was thoughtful he put up a plan regard to her anticoagulation and desire for possible future pregnancies.  Taking mvi  Not extra .  vits    Hx if iro defic from menses  And fibroids   ROS: See pertinent positives and negatives per HPI. No cp sob bruising     Past Medical History:  Diagnosis Date  . Iron deficiency anemia   . Uterine fibroid   . Wears glasses     Family History  Problem Relation Age of Onset  . Hypertension Mother   . Prostate cancer Paternal Uncle   . Diabetes Maternal Grandmother   . Leukemia Cousin   . Brain cancer Cousin   . Deep vein thrombosis Father   . Varicose Veins Father     Social History   Social History  . Marital status: Single    Spouse name: N/A  . Number of children: N/A  . Years of education: N/A   Social History Main Topics  . Smoking status: Never Smoker  . Smokeless tobacco: Never Used  . Alcohol use Yes     Comment: occasional  . Drug use: No  . Sexual activity: Yes    Birth control/ protection: None   Other Topics Concern  . None   Social History Narrative   Divorced   Regular exercise- no   HH of 2 with daughter no pets       Age 43 daughter    Works [employer]   40 hours    MOM ruby Steinmiller    Outpatient Medications Prior to Visit  Medication Sig Dispense Refill  . acetaminophen (TYLENOL) 500 MG tablet Take 1,000 mg  by mouth every 6 (six) hours as needed for headache.     . Iron, Ferrous Gluconate, 256 (28 Fe) MG TABS Take 256 mg by mouth daily.     . Magnesium Oxide (MAG-OX 400 PO) Take 400 mg by mouth daily.    . MULTIPLE VITAMIN PO Take 1 tablet by mouth daily.     . valACYclovir (VALTREX) 500 MG tablet Take 1 tablet (500 mg total) by mouth 2 (two) times daily. For 3-5 days for outbreak 30 tablet 5  . vitamin C (ASCORBIC ACID) 500 MG tablet Take 500 mg by mouth daily.    . rivaroxaban (XARELTO) 20 MG TABS tablet Take 1 tablet (20 mg total) by mouth daily with supper. 30 tablet 4  . amoxicillin-clavulanate (AUGMENTIN) 875-125 MG tablet Take 1 tablet by mouth every 12 (twelve) hours. 14 tablet 0   No facility-administered medications prior to visit.      EXAM:  BP 100/70 (BP Location: Right Arm, Patient Position: Sitting, Cuff Size: Normal)   Temp 98.1 F (36.7 C) (Oral)   Wt 141 lb (64 kg)   BMI 26.21 kg/m   Body mass index is 26.21 kg/m.  GENERAL:  vitals reviewed and listed above, alert, oriented, appears well hydrated and in no acute distressm ild congestion HEENT: atraumatic, conjunctiva  clear, no obvious abnormalities on inspection of external nose and ears OP : no lesion edema or exudate  NECK: no obvious masses on inspection palpation  LUNGS: clear to auscultation bilaterally, no wheezes, rales or rhonchi,  CV: HRRR, no clubbing cyanosis or  peripheral edema nl cap refill  MS: moves all extremities without noticeable focal  abnormality PSYCH: pleasant and cooperative, no obvious depression or anxiety Lab Results  Component Value Date   WBC 6.7 04/09/2016   HGB 10.7 (L) 02/15/2016   HCT 33.6 (L) 04/09/2016   PLT 295 04/09/2016   GLUCOSE 80 02/15/2016   CHOL 165 02/15/2016   TRIG 46.0 02/15/2016   HDL 59.30 02/15/2016   LDLCALC 96 02/15/2016   ALT 13 02/15/2016   AST 12 02/15/2016   NA 139 02/15/2016   K 4.3 02/15/2016   CL 107 02/15/2016   CREATININE 0.96 02/15/2016   BUN  13 02/15/2016   CO2 25 02/15/2016   TSH 0.93 02/15/2016   INR 1.08 12/11/2015  reviewed with patient and  Labs done by dr G   ASSESSMENT AND PLAN:  Discussed the following assessment and plan:  Iron deficiency anemia, unspecified iron deficiency anemia type  Hx pulmonary embolism  Anticoagulant long-term use - to dc   URI, acute resp infection  Improving    No antibiotic use .  disc about  Dr Jackelyn Hoehn advice and I agree    Reviewed her iron deficiency and trial of going back on iron  Every other day  Or as tolerated . Esp w menses   Labs can be follow up but [facility] or here if needed.  Otherwise  Yearly check up and  -Patient advised to return or notify health care team  if symptoms worsen ,persist or new concerns arise. Before next visit .  Patient Instructions  Try taking iron otc  Every other day or 3 d per week  And not  With calcium or magnesium at the same time.  This will helps the iron   deficiency anemia .   Can also  Take the vit d and iron  St  the same time ..   I agree ok to go off the  Anticoagulant and plan  as per dr Beryle Beams.   Continue saline nose spray  .  Otherwise   At yearly check up   Standley Brooking. Panosh M.D.

## 2016-05-21 ENCOUNTER — Encounter: Payer: Self-pay | Admitting: Internal Medicine

## 2016-05-21 ENCOUNTER — Ambulatory Visit (INDEPENDENT_AMBULATORY_CARE_PROVIDER_SITE_OTHER): Payer: BLUE CROSS/BLUE SHIELD | Admitting: Internal Medicine

## 2016-05-21 VITALS — BP 100/70 | Temp 98.1°F | Wt 141.0 lb

## 2016-05-21 DIAGNOSIS — Z86711 Personal history of pulmonary embolism: Secondary | ICD-10-CM

## 2016-05-21 DIAGNOSIS — J069 Acute upper respiratory infection, unspecified: Secondary | ICD-10-CM

## 2016-05-21 DIAGNOSIS — D509 Iron deficiency anemia, unspecified: Secondary | ICD-10-CM | POA: Diagnosis not present

## 2016-05-21 DIAGNOSIS — Z7901 Long term (current) use of anticoagulants: Secondary | ICD-10-CM

## 2016-05-21 NOTE — Patient Instructions (Signed)
Try taking iron otc  Every other day or 3 d per week  And not  With calcium or magnesium at the same time.  This will helps the iron   deficiency anemia .   Can also  Take the vit d and iron  St  the same time ..   I agree ok to go off the  Anticoagulant and plan  as per dr Beryle Beams.   Continue saline nose spray  .  Otherwise   At yearly check up

## 2016-06-06 DIAGNOSIS — D251 Intramural leiomyoma of uterus: Secondary | ICD-10-CM | POA: Diagnosis not present

## 2016-06-06 DIAGNOSIS — N979 Female infertility, unspecified: Secondary | ICD-10-CM | POA: Diagnosis not present

## 2016-06-06 DIAGNOSIS — N924 Excessive bleeding in the premenopausal period: Secondary | ICD-10-CM | POA: Diagnosis not present

## 2016-06-06 DIAGNOSIS — N978 Female infertility of other origin: Secondary | ICD-10-CM | POA: Diagnosis not present

## 2016-06-18 DIAGNOSIS — J343 Hypertrophy of nasal turbinates: Secondary | ICD-10-CM | POA: Diagnosis not present

## 2016-06-18 DIAGNOSIS — J310 Chronic rhinitis: Secondary | ICD-10-CM | POA: Diagnosis not present

## 2016-06-25 DIAGNOSIS — Z319 Encounter for procreative management, unspecified: Secondary | ICD-10-CM | POA: Diagnosis not present

## 2016-07-02 ENCOUNTER — Ambulatory Visit (INDEPENDENT_AMBULATORY_CARE_PROVIDER_SITE_OTHER): Payer: BLUE CROSS/BLUE SHIELD | Admitting: Oncology

## 2016-07-02 ENCOUNTER — Encounter: Payer: Self-pay | Admitting: Oncology

## 2016-07-02 ENCOUNTER — Encounter (INDEPENDENT_AMBULATORY_CARE_PROVIDER_SITE_OTHER): Payer: Self-pay

## 2016-07-02 VITALS — BP 121/68 | HR 79 | Temp 98.0°F | Ht 62.0 in | Wt 144.1 lb

## 2016-07-02 DIAGNOSIS — Z09 Encounter for follow-up examination after completed treatment for conditions other than malignant neoplasm: Secondary | ICD-10-CM | POA: Diagnosis not present

## 2016-07-02 DIAGNOSIS — D509 Iron deficiency anemia, unspecified: Secondary | ICD-10-CM | POA: Diagnosis not present

## 2016-07-02 DIAGNOSIS — I2699 Other pulmonary embolism without acute cor pulmonale: Secondary | ICD-10-CM

## 2016-07-02 DIAGNOSIS — Z885 Allergy status to narcotic agent status: Secondary | ICD-10-CM | POA: Diagnosis not present

## 2016-07-02 DIAGNOSIS — Z8742 Personal history of other diseases of the female genital tract: Secondary | ICD-10-CM

## 2016-07-02 DIAGNOSIS — Z86711 Personal history of pulmonary embolism: Secondary | ICD-10-CM | POA: Diagnosis not present

## 2016-07-02 DIAGNOSIS — Z3183 Encounter for assisted reproductive fertility procedure cycle: Secondary | ICD-10-CM

## 2016-07-02 HISTORY — DX: Encounter for assisted reproductive fertility procedure cycle: Z31.83

## 2016-07-02 NOTE — Patient Instructions (Signed)
To lab today Return visit 6 months 

## 2016-07-03 LAB — CBC WITH DIFFERENTIAL/PLATELET
Basophils Absolute: 0 10*3/uL (ref 0.0–0.2)
Basos: 0 %
EOS (ABSOLUTE): 0 10*3/uL (ref 0.0–0.4)
Eos: 0 %
Hematocrit: 37.3 % (ref 34.0–46.6)
Hemoglobin: 11.4 g/dL (ref 11.1–15.9)
Immature Grans (Abs): 0 10*3/uL (ref 0.0–0.1)
Immature Granulocytes: 0 %
Lymphocytes Absolute: 1.5 10*3/uL (ref 0.7–3.1)
Lymphs: 28 %
MCH: 20.8 pg — ABNORMAL LOW (ref 26.6–33.0)
MCHC: 30.6 g/dL — ABNORMAL LOW (ref 31.5–35.7)
MCV: 68 fL — ABNORMAL LOW (ref 79–97)
Monocytes Absolute: 0.4 10*3/uL (ref 0.1–0.9)
Monocytes: 7 %
Neutrophils Absolute: 3.6 10*3/uL (ref 1.4–7.0)
Neutrophils: 65 %
Platelets: 297 10*3/uL (ref 150–379)
RBC: 5.47 x10E6/uL — ABNORMAL HIGH (ref 3.77–5.28)
RDW: 19 % — ABNORMAL HIGH (ref 12.3–15.4)
WBC: 5.5 10*3/uL (ref 3.4–10.8)

## 2016-07-03 LAB — FERRITIN: Ferritin: 15 ng/mL (ref 15–150)

## 2016-07-09 DIAGNOSIS — N8500 Endometrial hyperplasia, unspecified: Secondary | ICD-10-CM | POA: Diagnosis not present

## 2016-07-09 DIAGNOSIS — Z319 Encounter for procreative management, unspecified: Secondary | ICD-10-CM | POA: Diagnosis not present

## 2016-07-09 DIAGNOSIS — Z113 Encounter for screening for infections with a predominantly sexual mode of transmission: Secondary | ICD-10-CM | POA: Diagnosis not present

## 2016-07-09 DIAGNOSIS — Z3141 Encounter for fertility testing: Secondary | ICD-10-CM | POA: Diagnosis not present

## 2016-07-11 MED ORDER — ENOXAPARIN SODIUM 40 MG/0.4ML ~~LOC~~ SOLN: 40.0000 mg | Freq: Every day | SUBCUTANEOUS | 3 refills | Status: DC

## 2016-07-11 NOTE — Progress Notes (Signed)
Hematology and Oncology Follow Up Visit  Jessica Dudley [id number] 1976-03-03 41 y.o. 07/11/2016 2:32 PM   Principle Diagnosis: Encounter Diagnoses  Name Primary?  . PE (pulmonary thromboembolism) ([place])   . Microcytic anemia Yes  . In vitro fertilization      Interim History:   41 year old woman I saw for the first time back in December 2017.  She is divorced and remarried.  She had a child with her first husband.  She has been trying to get pregnant again with her new husband but having difficulty. She developed low volume, subsegmental vessel, right lower lobe pulmonary emboli 11 days after a abdominal myomectomy to resect uterine fibroids.  She was anticoagulated with Xarelto for 3 months.  Please see my office consultation for complete details. At time of that visit I did screen her for some of the major congenital and acquired coagulopathies and she tested negative for factor V Leiden and prothrombin gene mutations, negative for anticardiolipin antibodies, negative antibodies against beta-2 glycoprotein 1, negative lupus type anticoagulant.  Normal Antithrombin level. She is now interested in proceeding with an in vitro fertilization process. She has no new complaints today.  No chest pain dyspnea or palpitations.   Medications: reviewed  Allergies:  Allergies  Allergen Reactions  . Norco [Hydrocodone-Acetaminophen] Nausea And Vomiting  . Oxycodone-Acetaminophen Nausea And Vomiting    Review of Systems: See interim history Remaining ROS negative:   Physical Exam: Blood pressure 121/68, pulse 79, temperature 98 F (36.7 C), temperature source Oral, height 5\' 2"  (1.575 m), weight 144 lb 1.6 oz (65.4 kg), last menstrual period 06/30/2016, SpO2 100 %. Wt Readings from Last 3 Encounters:  07/02/16 144 lb 1.6 oz (65.4 kg)  05/21/16 141 lb (64 kg)  05/17/16 142 lb (64.4 kg)     General appearance: Well-nourished African-American woman HENNT: Pharynx no erythema,  exudate, mass, or ulcer. No thyromegaly or thyroid nodules Lymph nodes: No cervical, supraclavicular, or axillary lymphadenopathy Breasts:  Lungs: Clear to auscultation, resonant to percussion throughout Heart: Regular rhythm, no murmur, no gallop, no rub, no click, no edema Abdomen: Soft, nontender, normal bowel sounds, no mass, no organomegaly Extremities: No edema, no calf tenderness Musculoskeletal: no joint deformities GU:  Vascular: Carotid pulses 2+, no bruits, distal pulses: Dorsalis pedis 1+ symmetric Neurologic: Alert, oriented, PERRLA, cranial nerves grossly normal, motor strength 5 over 5, reflexes 1+ symmetric, upper body coordination normal, gait normal, Skin: No rash or ecchymosis  Lab Results: CBC W/Diff    Component Value Date/Time   WBC 5.5 07/02/2016 1431   WBC 3.5 (L) 02/15/2016 0831   RBC 5.47 (H) 07/02/2016 1431   RBC 5.16 (H) 02/15/2016 0831   HGB 10.7 (L) 02/15/2016 0831   HCT 37.3 07/02/2016 1431   PLT 297 07/02/2016 1431   MCV 68 (L) 07/02/2016 1431   MCH 20.8 (L) 07/02/2016 1431   MCH 19.9 (L) 12/12/2015 0217   MCHC 30.6 (L) 07/02/2016 1431   MCHC 32.0 02/15/2016 0831   RDW 19.0 (H) 07/02/2016 1431   LYMPHSABS 1.5 07/02/2016 1431   MONOABS 0.6 02/15/2016 0831   EOSABS 0.0 07/02/2016 1431   BASOSABS 0.0 07/02/2016 1431     Chemistry      Component Value Date/Time   NA 139 02/15/2016 0831   K 4.3 02/15/2016 0831   CL 107 02/15/2016 0831   CO2 25 02/15/2016 0831   BUN 13 02/15/2016 0831   CREATININE 0.96 02/15/2016 0831      Component Value Date/Time  CALCIUM 9.4 02/15/2016 0831   ALKPHOS 37 (L) 02/15/2016 0831   AST 12 02/15/2016 0831   ALT 13 02/15/2016 0831   BILITOT 0.5 02/15/2016 0831       Radiological Studies: No results found.  Impression: 8 months status post provoked low-volume right pulmonary embolus.  Now wishing to proceed with an in vitro fertilization process.  We discussed a strategy going forward.  I was able to  discuss her situation with her fertility expert Dr.Yalcinkaya by phone.  She will begin a prophylactic dose Lovenox 40 mg daily when she starts getting hormonal stimulation for egg retrieval.  Hold Lovenox the day before and day of egg retrieval.  If she does become pregnant, continue the Lovenox for the entire pregnancy and for 6 weeks postpartum.  Given the low clot burden with her pulmonary embolus and the provoked nature, I believe prophylactic dose low molecular weight heparin will be sufficient protection.  After additional consideration, I am now recommending using prophylactic dose rather than therapeutic dose Lovenox which is a change from my initial recommendation back in December.   CC: Patient Care Team: Burnis Medin, MD as PCP - General Christophe Louis, MD as Attending Physician (Obstetrics and Gynecology) Princess Bruins, MD as Consulting Physician (Obstetrics and Gynecology) Annia Belt, MD as Consulting Physician (Oncology) Princess Bruins, MD as Consulting Physician (Obstetrics and Gynecology) Governor Specking, MD as Consulting Physician (Obstetrics and Gynecology)   Murriel Hopper, MD, [place]  Hematology-Oncology/Internal Medicine  3/7/20182:32 PM

## 2016-07-19 DIAGNOSIS — Z3183 Encounter for assisted reproductive fertility procedure cycle: Secondary | ICD-10-CM | POA: Diagnosis not present

## 2016-07-20 ENCOUNTER — Telehealth: Payer: Self-pay | Admitting: *Deleted

## 2016-07-20 NOTE — Telephone Encounter (Signed)
Pt had called earlier about Lovenox refill. Informed her it was refilled and electronic sent to [facility] on 07/11/16. Stated she has been having trouble [facility]. Gave me a phone# to call ?[place] - [phone].  I called [facility] - talked to [place]. First she could not find her. Then stated they had not received refill request. Then stated pt had refused the medication and taken out of the system.. I asked if it can be re-activated- said it can be. Then upon searching - stated it had been shipped out w/arrival date of today - verified it was for 40 mg daily; stated it was.  Called pt - stated it was not on her shipment list to arrive today. Stated she remember them asking her about a medication, which she refused but did not think it was Lovenox or Enoxaparin. Sated she will call [facility] - to have med to arrive by next week. ANd if she have any problems, she will call back.

## 2016-07-25 DIAGNOSIS — Z3183 Encounter for assisted reproductive fertility procedure cycle: Secondary | ICD-10-CM | POA: Diagnosis not present

## 2016-07-25 DIAGNOSIS — N978 Female infertility of other origin: Secondary | ICD-10-CM | POA: Diagnosis not present

## 2016-07-27 ENCOUNTER — Encounter: Payer: Self-pay | Admitting: Oncology

## 2016-07-30 DIAGNOSIS — N924 Excessive bleeding in the premenopausal period: Secondary | ICD-10-CM | POA: Diagnosis not present

## 2016-07-30 DIAGNOSIS — Z3183 Encounter for assisted reproductive fertility procedure cycle: Secondary | ICD-10-CM | POA: Diagnosis not present

## 2016-07-30 DIAGNOSIS — Z113 Encounter for screening for infections with a predominantly sexual mode of transmission: Secondary | ICD-10-CM | POA: Diagnosis not present

## 2016-07-30 DIAGNOSIS — N978 Female infertility of other origin: Secondary | ICD-10-CM | POA: Diagnosis not present

## 2016-07-31 DIAGNOSIS — Z3183 Encounter for assisted reproductive fertility procedure cycle: Secondary | ICD-10-CM | POA: Diagnosis not present

## 2016-08-01 DIAGNOSIS — Z3183 Encounter for assisted reproductive fertility procedure cycle: Secondary | ICD-10-CM | POA: Diagnosis not present

## 2016-08-03 DIAGNOSIS — N978 Female infertility of other origin: Secondary | ICD-10-CM | POA: Diagnosis not present

## 2016-08-03 DIAGNOSIS — Z113 Encounter for screening for infections with a predominantly sexual mode of transmission: Secondary | ICD-10-CM | POA: Diagnosis not present

## 2016-08-03 DIAGNOSIS — Z3183 Encounter for assisted reproductive fertility procedure cycle: Secondary | ICD-10-CM | POA: Diagnosis not present

## 2016-08-03 DIAGNOSIS — N924 Excessive bleeding in the premenopausal period: Secondary | ICD-10-CM | POA: Diagnosis not present

## 2016-08-06 ENCOUNTER — Telehealth: Payer: Self-pay

## 2016-08-06 DIAGNOSIS — N840 Polyp of corpus uteri: Secondary | ICD-10-CM | POA: Diagnosis not present

## 2016-08-06 DIAGNOSIS — Z3183 Encounter for assisted reproductive fertility procedure cycle: Secondary | ICD-10-CM | POA: Diagnosis not present

## 2016-08-06 NOTE — Telephone Encounter (Signed)
Patient called to c/o underarm tenderness with some possible developing cysts. She also notes breast tenderness. She is undergoing IVF treatments. She thought that maybe shaving was contributing to the pain in her underarms so she switched to hair removal cream with no improvement in symptoms. Advised pt that this would be very hard to diagnose and treat over the phone. Recommended she come in for OV. She declined at this time. Then recommended she reach out to gyn/IVF Dr to ask about possible hormone fluctuations contributing to her symptoms, given that she also had breast tenderness at the same time. She will contact their office for advice. Nothing further needed at this time.

## 2016-08-11 DIAGNOSIS — Z3183 Encounter for assisted reproductive fertility procedure cycle: Secondary | ICD-10-CM | POA: Diagnosis not present

## 2016-08-14 DIAGNOSIS — Z1151 Encounter for screening for human papillomavirus (HPV): Secondary | ICD-10-CM | POA: Diagnosis not present

## 2016-08-14 DIAGNOSIS — Z6827 Body mass index (BMI) 27.0-27.9, adult: Secondary | ICD-10-CM | POA: Diagnosis not present

## 2016-08-14 DIAGNOSIS — Z01419 Encounter for gynecological examination (general) (routine) without abnormal findings: Secondary | ICD-10-CM | POA: Diagnosis not present

## 2016-08-24 DIAGNOSIS — F4323 Adjustment disorder with mixed anxiety and depressed mood: Secondary | ICD-10-CM | POA: Diagnosis not present

## 2016-08-27 DIAGNOSIS — F4323 Adjustment disorder with mixed anxiety and depressed mood: Secondary | ICD-10-CM | POA: Diagnosis not present

## 2016-08-30 DIAGNOSIS — F4323 Adjustment disorder with mixed anxiety and depressed mood: Secondary | ICD-10-CM | POA: Diagnosis not present

## 2016-08-31 DIAGNOSIS — Z3183 Encounter for assisted reproductive fertility procedure cycle: Secondary | ICD-10-CM | POA: Diagnosis not present

## 2016-08-31 DIAGNOSIS — N924 Excessive bleeding in the premenopausal period: Secondary | ICD-10-CM | POA: Diagnosis not present

## 2016-09-24 DIAGNOSIS — N924 Excessive bleeding in the premenopausal period: Secondary | ICD-10-CM | POA: Diagnosis not present

## 2016-09-24 DIAGNOSIS — Z3183 Encounter for assisted reproductive fertility procedure cycle: Secondary | ICD-10-CM | POA: Diagnosis not present

## 2016-09-24 DIAGNOSIS — D251 Intramural leiomyoma of uterus: Secondary | ICD-10-CM | POA: Diagnosis not present

## 2016-10-02 DIAGNOSIS — Z3183 Encounter for assisted reproductive fertility procedure cycle: Secondary | ICD-10-CM | POA: Diagnosis not present

## 2016-10-29 DIAGNOSIS — N840 Polyp of corpus uteri: Secondary | ICD-10-CM | POA: Diagnosis not present

## 2016-10-29 DIAGNOSIS — Z3183 Encounter for assisted reproductive fertility procedure cycle: Secondary | ICD-10-CM | POA: Diagnosis not present

## 2016-11-03 DIAGNOSIS — N840 Polyp of corpus uteri: Secondary | ICD-10-CM | POA: Diagnosis not present

## 2016-11-24 ENCOUNTER — Ambulatory Visit (INDEPENDENT_AMBULATORY_CARE_PROVIDER_SITE_OTHER): Payer: BLUE CROSS/BLUE SHIELD | Admitting: Family Medicine

## 2016-11-24 ENCOUNTER — Encounter: Payer: Self-pay | Admitting: Family Medicine

## 2016-11-24 VITALS — BP 106/70 | HR 83 | Temp 99.0°F | Wt 145.1 lb

## 2016-11-24 DIAGNOSIS — M545 Low back pain, unspecified: Secondary | ICD-10-CM | POA: Insufficient documentation

## 2016-11-24 MED ORDER — CYCLOBENZAPRINE HCL 10 MG PO TABS: 5.0000 mg | ORAL_TABLET | Freq: Three times a day (TID) | ORAL | 1 refills | Status: DC | PRN

## 2016-11-24 NOTE — Patient Instructions (Signed)
Continue heat on and off (10 minutes)  Stretch Walk  Try the flexeril with caution of sedation  Tylenol as needed   Update if not starting to improve in a week or if worsening

## 2016-11-24 NOTE — Progress Notes (Signed)
Subjective:    Patient ID: Jessica Dudley, female    DOB: 1975/08/30, 41 y.o.   MRN: [id number]  HPI Here for back pain  Started Wednesday (gradually)  Was doing renovations the week prior   Both sides of low back  Not throbbing  Feels deep/ache  Hurts more to bend over  She has been sleeping on a heating pad (it feels good)  Hurts to roll over and re position  No radiation down either leg   Not recurrent   Tylenol does not help much   In between ivf cycles  On lovenox  Cannot take nsaids    No urinary symptoms  No burning or freq or urgency  Just had a uterine biopsy-that went well   Wt Readings from Last 3 Encounters:  11/24/16 145 lb 1.3 oz (65.8 kg)  07/02/16 144 lb 1.6 oz (65.4 kg)  05/21/16 141 lb (64 kg)   Patient Active Problem List   Diagnosis Date Noted  . Low back pain 11/24/2016  . In vitro fertilization 07/02/2016  . PE (pulmonary thromboembolism) ([place]) 12/11/2015  . Rib pain on right side   . Leiomyoma 11/29/2015  . Anemia 06/13/2011  . Fibroid (bleeding) (uterine) 06/13/2011  . Visit for preventive health examination 06/13/2011  . Abnormal urinalysis 06/13/2011  . Hemorrhagic ovarian cyst 09/30/2010  . SEBACEOUS CYST 11/03/2009  . GENITAL HERPES, HX OF 11/03/2009  . GENITAL HERPES 06/09/2007  . Microcytic anemia 06/09/2007  . OTHER AND UNSPECIFIED OVARIAN CYST 06/09/2007  . HEADACHE 06/09/2007  . URINARY INCONTINENCE 06/09/2007  . FREQUENCY, URINARY 06/09/2007   Past Medical History:  Diagnosis Date  . In vitro fertilization 07/02/2016   Hematology supervision in view of previous PE 12/11/15  . Iron deficiency anemia   . Uterine fibroid   . Wears glasses    Past Surgical History:  Procedure Laterality Date  . CHROMOPERTUBATION N/A 11/29/2015   Procedure: ABDOMINAL MYOMECTOMY;  Surgeon: Governor Specking, MD;  Location: [facility];  Service: Gynecology;  Laterality: N/A;  . MYOMECTOMY    . NO PAST SURGERIES      Social History  Substance Use Topics  . Smoking status: Never Smoker  . Smokeless tobacco: Never Used  . Alcohol use Yes     Comment: occasional   Family History  Problem Relation Age of Onset  . Hypertension Mother   . Prostate cancer Paternal Uncle   . Diabetes Maternal Grandmother   . Leukemia Cousin   . Brain cancer Cousin   . Deep vein thrombosis Father   . Varicose Veins Father    Allergies  Allergen Reactions  . Norco [Hydrocodone-Acetaminophen] Nausea And Vomiting  . Oxycodone-Acetaminophen Nausea And Vomiting   Current Outpatient Prescriptions on File Prior to Visit  Medication Sig Dispense Refill  . acetaminophen (TYLENOL) 500 MG tablet Take 1,000 mg by mouth every 6 (six) hours as needed for headache.     . Iron, Ferrous Gluconate, 256 (28 Fe) MG TABS Take 256 mg by mouth daily.     . Magnesium Oxide (MAG-OX 400 PO) Take 400 mg by mouth daily.    . MULTIPLE VITAMIN PO Take 1 tablet by mouth daily.     . valACYclovir (VALTREX) 500 MG tablet Take 1 tablet (500 mg total) by mouth 2 (two) times daily. For 3-5 days for outbreak 30 tablet 5  . vitamin C (ASCORBIC ACID) 500 MG tablet Take 500 mg by mouth daily.    Marland Kitchen enoxaparin (LOVENOX) 40  MG/0.4ML injection Inject 0.4 mLs (40 mg total) into the skin daily. Hold day before and day of egg retrieval. Dx 126.99 and V26.81 (Patient not taking: Reported on 11/24/2016) 90 Syringe 3   No current facility-administered medications on file prior to visit.      Review of Systems Review of Systems  Constitutional: Negative for fever, appetite change, fatigue and unexpected weight change.  Eyes: Negative for pain and visual disturbance.  Respiratory: Negative for cough and shortness of breath.   Cardiovascular: Negative for cp or palpitations    Gastrointestinal: Negative for nausea, diarrhea and constipation.  Genitourinary: Negative for urgency and frequency.  Skin: Negative for pallor or rash   MSK pos for low back pain w/o  radiation  Neurological: Negative for weakness, light-headedness, numbness and headaches.  Hematological: Negative for adenopathy. Does not bruise/bleed easily.  Psychiatric/Behavioral: Negative for dysphoric mood. The patient is not nervous/anxious.         Objective:   Physical Exam  Constitutional: She appears well-developed and well-nourished. No distress.  Well appearing   She does not feel febrile/warm  HENT:  Head: Normocephalic and atraumatic.  Eyes: Pupils are equal, round, and reactive to light. Conjunctivae and EOM are normal. No scleral icterus.  Neck: Normal range of motion. Neck supple.  Cardiovascular: Normal rate and regular rhythm.   Pulmonary/Chest: Effort normal and breath sounds normal. She has no wheezes. She has no rales.  Abdominal: Soft. Bowel sounds are normal. She exhibits no distension. There is no tenderness.  Musculoskeletal: She exhibits tenderness.       Right shoulder: She exhibits tenderness and spasm. She exhibits normal range of motion, no bony tenderness, no effusion, no crepitus, no deformity, no pain, normal pulse and normal strength.       Lumbar back: She exhibits decreased range of motion, tenderness and spasm. She exhibits no bony tenderness and no edema.  Tender lumbar musculature bilat  No bony tenderness Nl rom with pain on flex over 90 deg and ext over 20 deg Nl lateral bend No neuro change  Neg SLR Nl gait  Lymphadenopathy:    She has no cervical adenopathy.  Neurological: She is alert. She has normal strength and normal reflexes. She displays no atrophy. No cranial nerve deficit or sensory deficit. She exhibits normal muscle tone. Coordination normal.  Negative SLR  Skin: Skin is warm and dry. No rash noted. No erythema. No pallor.  Psychiatric: She has a normal mood and affect.          Assessment & Plan:   Problem List Items Addressed This Visit      Other   Low back pain    Exam consistent with strain  Disc use of  heat/stretching (handout given) and acetaminophen  Flexeril px for prn use with caution of sedation (should help at night)  Disc opt sleep position  Update if not starting to improve in a week or if worsening  Consider imaging /PT if no imp      Relevant Medications   cyclobenzaprine (FLEXERIL) 10 MG tablet

## 2016-11-25 IMAGING — DX DG CHEST 2V
2 series · 2 of 2 positions shown · non-contrast
Comparison: None.

CLINICAL DATA: Patient with right-sided chest pain.

EXAM:
CHEST  2 VIEW

[chest pa]
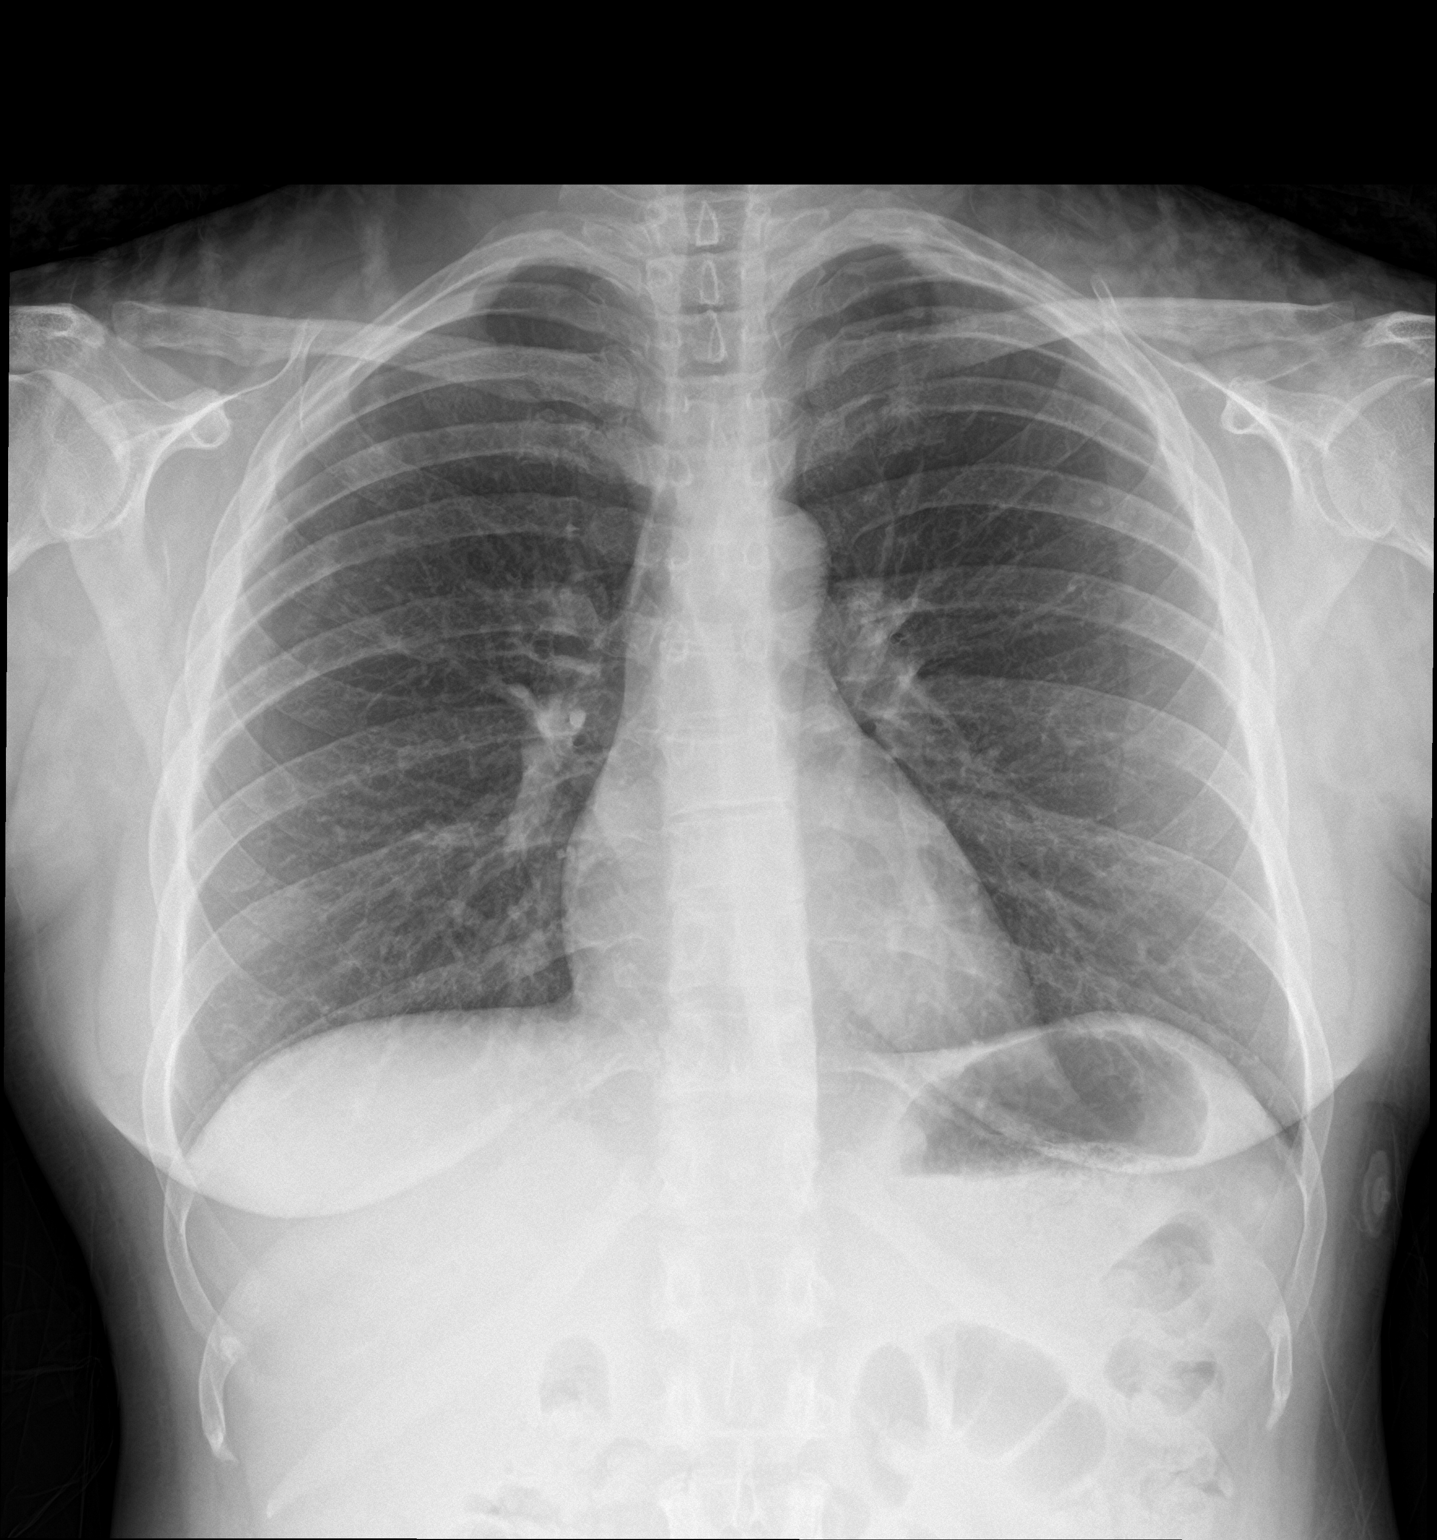

[chest lat]
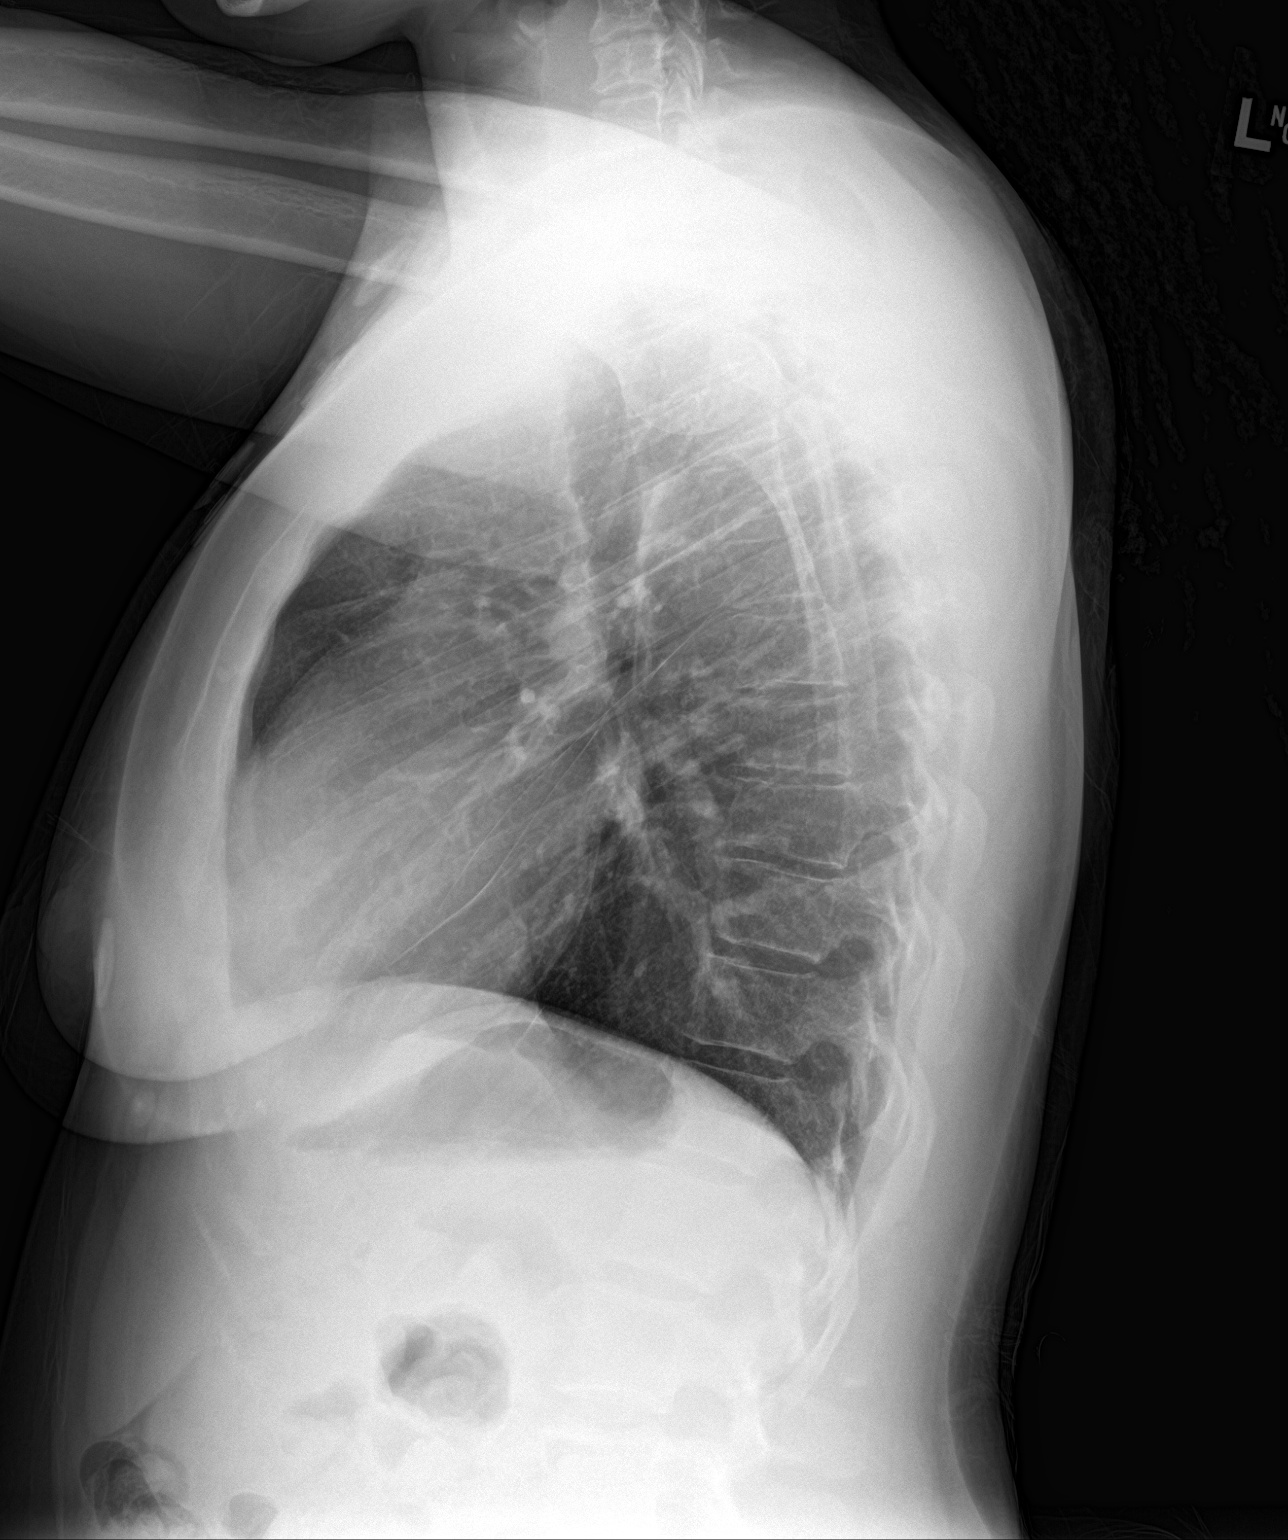

[2 of 2 positions shown; findings below may reference images not displayed]

FINDINGS: The heart size and mediastinal contours are within normal limits.
Both lungs are clear. The visualized skeletal structures are
unremarkable.
IMPRESSION: No active cardiopulmonary disease.

## 2016-11-25 NOTE — Assessment & Plan Note (Signed)
Exam consistent with strain  Disc use of heat/stretching (handout given) and acetaminophen  Flexeril px for prn use with caution of sedation (should help at night)  Disc opt sleep position  Update if not starting to improve in a week or if worsening  Consider imaging /PT if no imp

## 2016-12-04 IMAGING — DX DG CHEST 2V
2 series · 2 of 2 positions shown · non-contrast
Comparison: Chest x-ray of December 10, 2015

CLINICAL DATA: Shortness of breath with a pleuritic component
beginning this morning. Also right shoulder discomfort. History of
pulmonary embolism in recent hospitalization.

EXAM:
CHEST  2 VIEW

[chest pa]
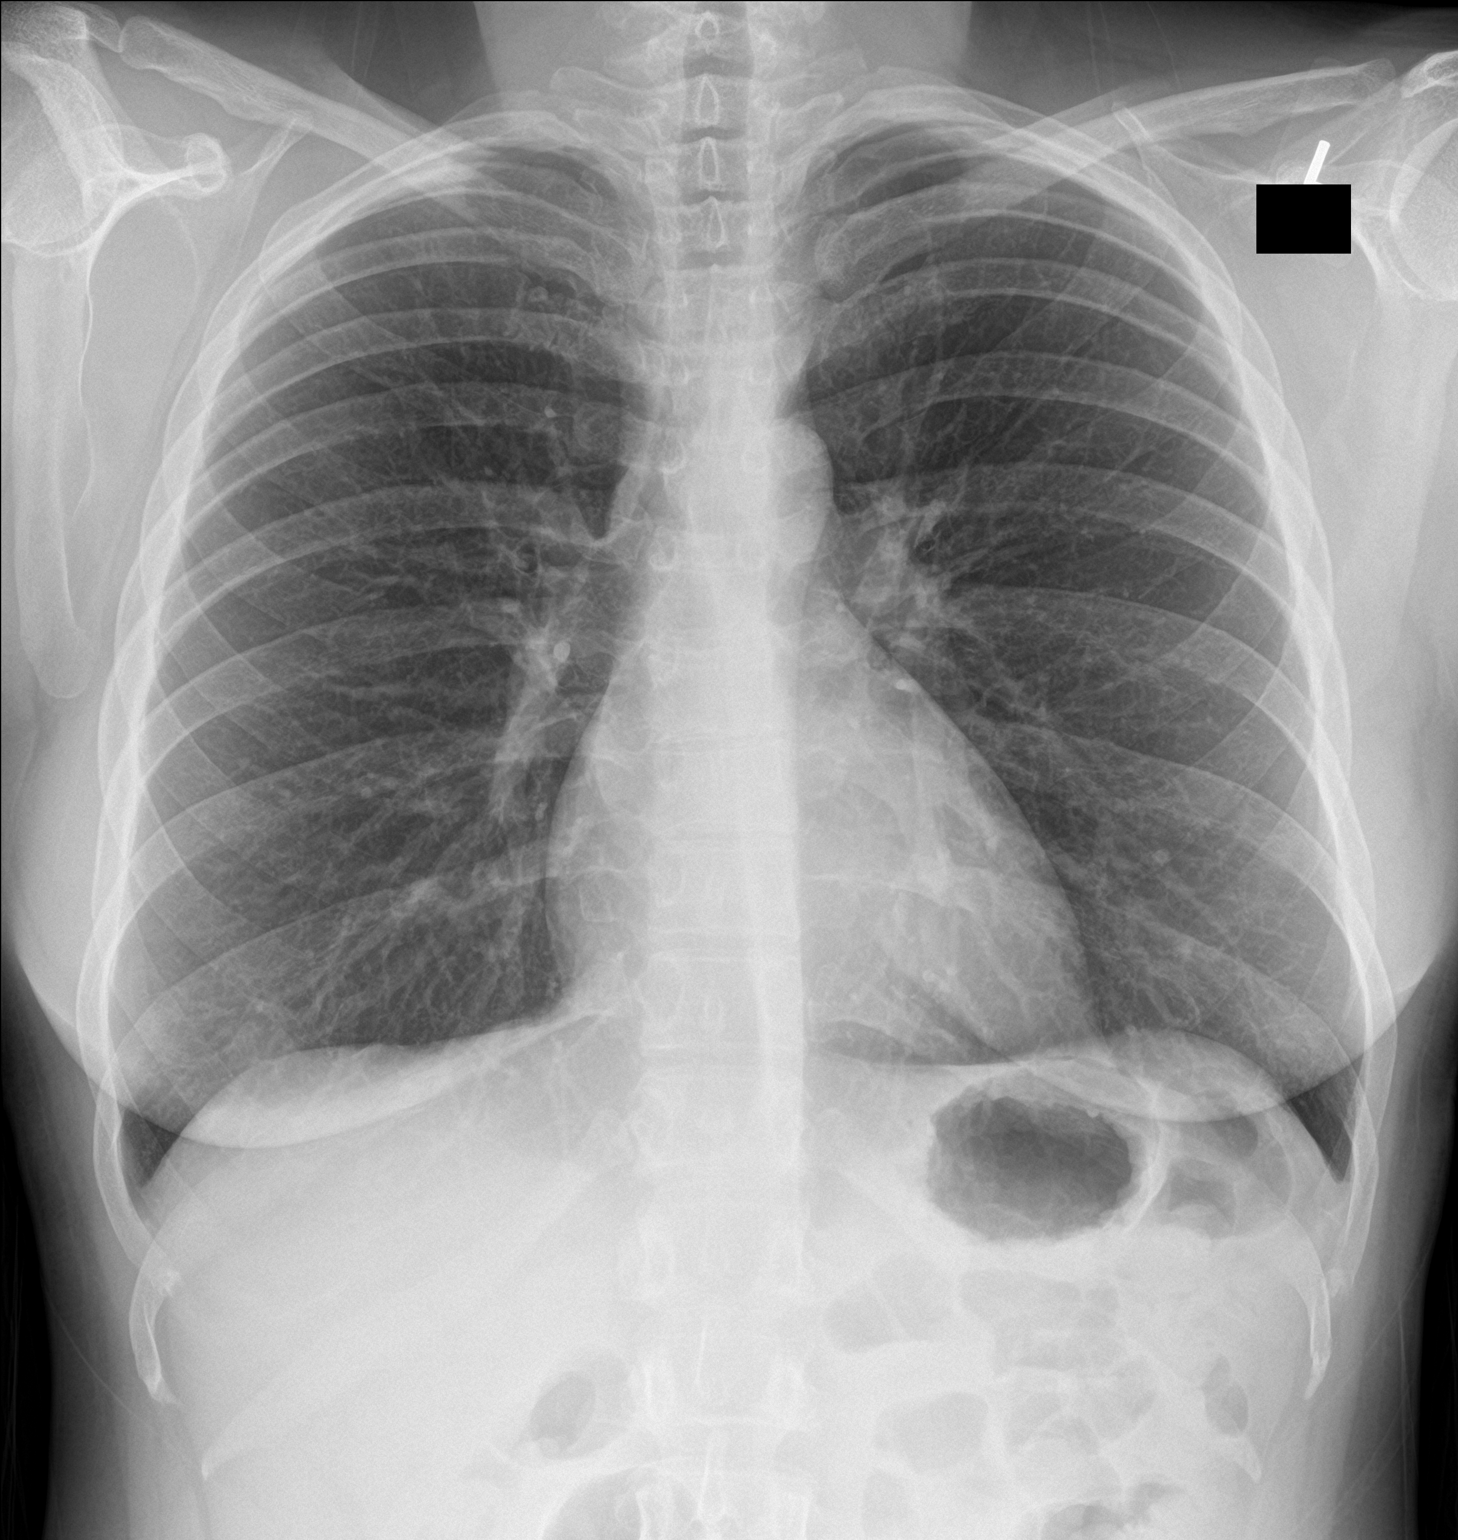

[chest lat]
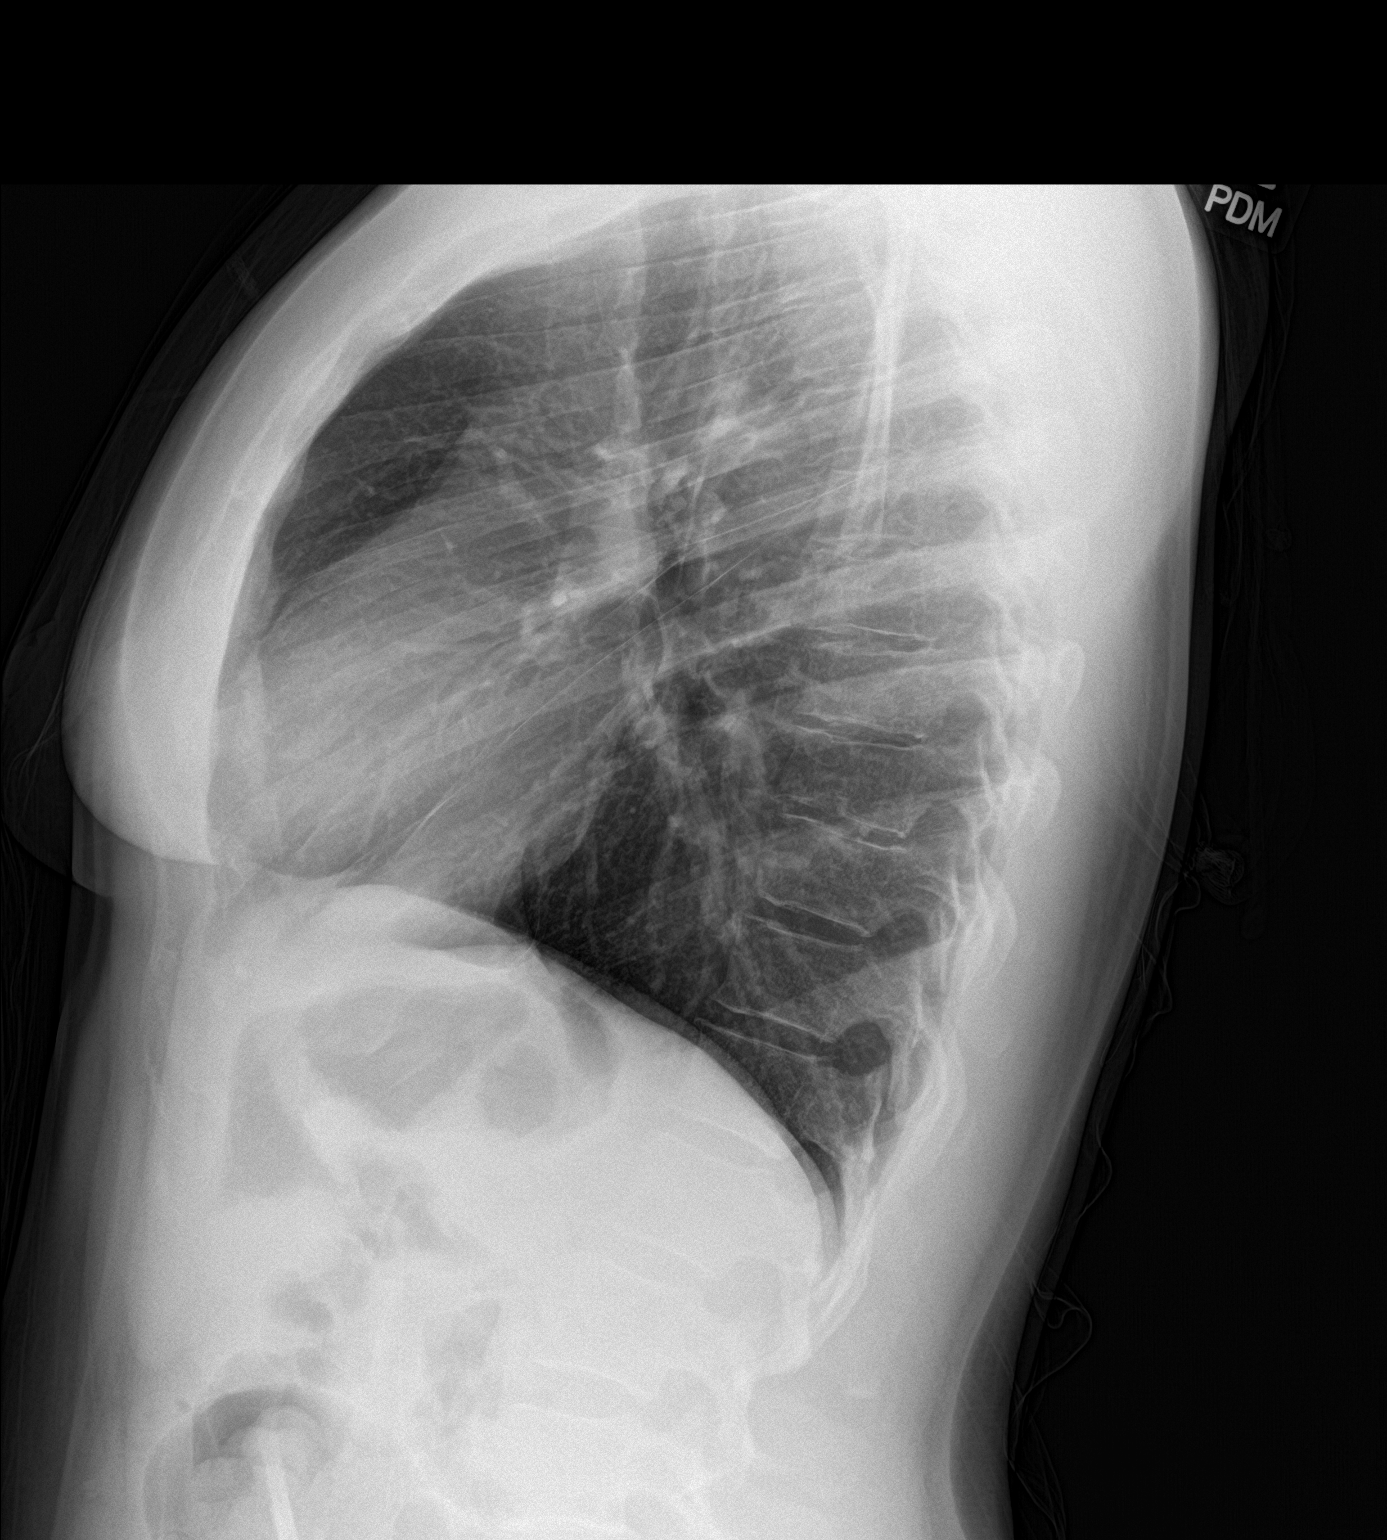

[2 of 2 positions shown; findings below may reference images not displayed]

FINDINGS: The lungs are mildly hyperinflated but clear. The heart and
pulmonary vascularity are normal. The mediastinum is normal in
width. There is no pleural effusion. The bony thorax exhibits no
acute abnormality.
IMPRESSION: Mild hyperinflation may be voluntary or may reflect underlying
reactive airway disease. There is no evidence of pneumonia.

## 2016-12-12 DIAGNOSIS — Z1231 Encounter for screening mammogram for malignant neoplasm of breast: Secondary | ICD-10-CM | POA: Diagnosis not present

## 2016-12-14 DIAGNOSIS — N924 Excessive bleeding in the premenopausal period: Secondary | ICD-10-CM | POA: Diagnosis not present

## 2016-12-14 DIAGNOSIS — Z3183 Encounter for assisted reproductive fertility procedure cycle: Secondary | ICD-10-CM | POA: Diagnosis not present

## 2016-12-25 DIAGNOSIS — Z3183 Encounter for assisted reproductive fertility procedure cycle: Secondary | ICD-10-CM | POA: Diagnosis not present

## 2017-01-01 ENCOUNTER — Ambulatory Visit: Payer: BLUE CROSS/BLUE SHIELD | Admitting: Oncology

## 2017-01-02 DIAGNOSIS — Z0000 Encounter for general adult medical examination without abnormal findings: Secondary | ICD-10-CM | POA: Diagnosis not present

## 2017-01-04 DIAGNOSIS — Z3200 Encounter for pregnancy test, result unknown: Secondary | ICD-10-CM | POA: Diagnosis not present

## 2017-01-08 ENCOUNTER — Other Ambulatory Visit: Payer: Self-pay | Admitting: Internal Medicine

## 2017-01-08 ENCOUNTER — Other Ambulatory Visit: Payer: Self-pay | Admitting: Oncology

## 2017-01-08 MED ORDER — ENOXAPARIN SODIUM 40 MG/0.4ML ~~LOC~~ SOLN: 40.0000 mg | Freq: Every day | SUBCUTANEOUS | 3 refills | Status: DC

## 2017-01-08 NOTE — Telephone Encounter (Signed)
done

## 2017-01-08 NOTE — Telephone Encounter (Signed)
Patient is requesting a refill on injection medicine (Lovenox); patient requesting it be sent to [id number] - [place], [place] [facility] [place] [id number] - [address], [place]

## 2017-01-17 DIAGNOSIS — Z3200 Encounter for pregnancy test, result unknown: Secondary | ICD-10-CM | POA: Diagnosis not present

## 2017-01-28 ENCOUNTER — Encounter: Payer: Self-pay | Admitting: Oncology

## 2017-01-28 ENCOUNTER — Ambulatory Visit (INDEPENDENT_AMBULATORY_CARE_PROVIDER_SITE_OTHER): Payer: BLUE CROSS/BLUE SHIELD | Admitting: Oncology

## 2017-01-28 VITALS — BP 113/74 | HR 76 | Temp 98.1°F | Ht 62.0 in | Wt 151.0 lb

## 2017-01-28 DIAGNOSIS — Z3A00 Weeks of gestation of pregnancy not specified: Secondary | ICD-10-CM | POA: Diagnosis not present

## 2017-01-28 DIAGNOSIS — O88812 Other embolism in pregnancy, second trimester: Secondary | ICD-10-CM

## 2017-01-28 DIAGNOSIS — I2699 Other pulmonary embolism without acute cor pulmonale: Secondary | ICD-10-CM

## 2017-01-28 DIAGNOSIS — O0990 Supervision of high risk pregnancy, unspecified, unspecified trimester: Secondary | ICD-10-CM

## 2017-01-28 MED ORDER — ENOXAPARIN SODIUM 40 MG/0.4ML ~~LOC~~ SOLN: 40.0000 mg | Freq: Every day | SUBCUTANEOUS | 3 refills | Status: DC

## 2017-01-28 MED ORDER — ENOXAPARIN SODIUM 150 MG/ML ~~LOC~~ SOLN: 100.0000 mg | SUBCUTANEOUS | 6 refills | Status: DC

## 2017-01-28 NOTE — Patient Instructions (Addendum)
Stay on current dose of lovenox until November 30 then on December 1st, increase dose to 100 mg daily Return visit with Dr Darnell Level in March, 2019 lab 1 week before visit: do lab between 11 & 12 PM

## 2017-01-28 NOTE — Progress Notes (Signed)
Hematology and Oncology Follow Up Visit  Jessica Dudley [id number] 31-Mar-1976 41 y.o. 01/28/2017 4:11 PM   Principle Diagnosis: Encounter Diagnoses  Name Primary?  . PE (pulmonary thromboembolism) ([place]) Yes  . High risk pregnancy, antepartum   Clinical summary: 41 year old woman I saw for the first time back in December 2017.  She is divorced and remarried.  She had a child with her first husband.  She has been trying to get pregnant again with her new husband but having difficulty. She developed low volume, subsegmental vessel, right lower lobe pulmonary emboli 11 days after an abdominal myomectomy to resect uterine fibroids.  She was anticoagulated with Xarelto for 3 months.  Please see my 12/17 office consultation for complete details. At time of that visit I did screen her for some of the major congenital and acquired coagulopathies and she tested negative for factor V Leiden and prothrombin gene mutations, negative for anticardiolipin antibodies, negative antibodies against beta-2 glycoprotein 1, negative lupus type anticoagulant.  Normal Antithrombin level. She initiated an in vitro fertilization process. Due to the requirements of hormone injections, I did advise prophylactic dose Lovenox.   Interim History:  Attempt at in vitro fertilization has been successful. She is now pregnant at [redacted] weeks and due on 09/12/2017. She continues on prophylactic dose Lovenox 40 mg daily. This is her second pregnancy. She is having significant nausea at this time which she did not experience as bad in her first pregnancy. No dyspnea chest pain or palpitations. No leg pain or swelling.  Medications: reviewed  Allergies:  Allergies  Allergen Reactions  . Norco [Hydrocodone-Acetaminophen] Nausea And Vomiting  . Oxycodone-Acetaminophen Nausea And Vomiting    Review of Systems: See interim history Remaining ROS negative:   Physical Exam: Blood pressure 113/74, pulse 76, temperature 98.1 F  (36.7 C), temperature source Oral, height 5\' 2"  (1.575 m), weight 151 lb (68.5 kg), SpO2 100 %. Wt Readings from Last 3 Encounters:  01/28/17 151 lb (68.5 kg)  11/24/16 145 lb 1.3 oz (65.8 kg)  07/02/16 144 lb 1.6 oz (65.4 kg)     General appearance: Pleasant well-nourished African-American woman HENNT: Pharynx no erythema, exudate, mass, or ulcer. No thyromegaly or thyroid nodules Lymph nodes: No cervical, supraclavicular, or axillary lymphadenopathy Breasts:  Lungs: Clear to auscultation, resonant to percussion throughout Heart: Regular rhythm, no murmur, no gallop, no rub, no click, no edema Abdomen: Soft, nontender, normal bowel sounds, no mass, no organomegaly Extremities: No edema, no calf tenderness Musculoskeletal: no joint deformities GU:  Vascular: Carotid pulses 2+, distal pulses: Dorsalis pedis 1+ symmetric Neurologic: Alert, oriented, PERRLA, optic discs sharp and vessels normal, no hemorrhage or exudate, cranial nerves grossly normal, motor strength 5 over 5, reflexes 1+ symmetric, upper body coordination normal, gait normal, Skin: No rash or ecchymosis  Lab Results: CBC W/Diff    Component Value Date/Time   WBC 5.5 07/02/2016 1431   WBC 3.5 (L) 02/15/2016 0831   RBC 5.47 (H) 07/02/2016 1431   RBC 5.16 (H) 02/15/2016 0831   HGB 11.4 07/02/2016 1431   HCT 37.3 07/02/2016 1431   PLT 297 07/02/2016 1431   MCV 68 (L) 07/02/2016 1431   MCH 20.8 (L) 07/02/2016 1431   MCH 19.9 (L) 12/12/2015 0217   MCHC 30.6 (L) 07/02/2016 1431   MCHC 32.0 02/15/2016 0831   RDW 19.0 (H) 07/02/2016 1431   LYMPHSABS 1.5 07/02/2016 1431   MONOABS 0.6 02/15/2016 0831   EOSABS 0.0 07/02/2016 1431   BASOSABS 0.0 07/02/2016 1431  Chemistry      Component Value Date/Time   NA 139 02/15/2016 0831   K 4.3 02/15/2016 0831   CL 107 02/15/2016 0831   CO2 25 02/15/2016 0831   BUN 13 02/15/2016 0831   CREATININE 0.96 02/15/2016 0831      Component Value Date/Time   CALCIUM 9.4  02/15/2016 0831   ALKPHOS 37 (L) 02/15/2016 0831   AST 12 02/15/2016 0831   ALT 13 02/15/2016 0831   BILITOT 0.5 02/15/2016 0831       Radiological Studies: No results found.  Impression:  First trimester pregnancy now at 8 weeks following successful in vitro fertilization procedure. 10 months status post low volume pulmonary emboli provoked by a surgical procedure.  I'm going to continue the prophylactic dose Lovenox until she begins her third trimester approximately 04/06/2017. I will then escalate to therapeutic dose, 100 g daily, to cover her for the period of highest risk in the third trimester and then continue for 6-8 weeks postpartum. She is going to have a C-section with a planned induction of labor. We discussed my strong opinion that low molecular weight heparin remains the optimal anticoagulant for pregnant women and it can be safely discontinued 24 hours prior to induction of labor. There are disadvantages to transitioning to unfractionated subcutaneous heparin with respect to erratic drug levels, risk for osteoporosis, and the fact that the pharmacokinetics is different when given subcutaneous than when given IV and it is not guaranteed that it would be completely reversed by protamine in an emergency. Just 2 months ago, the FDA approved a specific reversal agent for Xa inhibitors, Andexanet. This will also work for Lovenox if needed in the event of a life-threatening hemorrhagic emergency. The drug is not yet on our formulary but I believe this product will be safer than protamine.  CC: Patient Care Team: Panosh, Standley Brooking, MD as PCP - General Christophe Louis, MD as Attending Physician (Obstetrics and Gynecology) Princess Bruins, MD as Consulting Physician (Obstetrics and Gynecology) Annia Belt, MD as Consulting Physician (Oncology) Princess Bruins, MD as Consulting Physician (Obstetrics and Gynecology) Governor Specking, MD as Consulting Physician (Obstetrics and  Gynecology)   Murriel Hopper, MD, [place]  Hematology-Oncology/Internal Medicine     9/24/20184:11 PM

## 2017-02-05 DIAGNOSIS — Z3A01 Less than 8 weeks gestation of pregnancy: Secondary | ICD-10-CM | POA: Diagnosis not present

## 2017-02-05 DIAGNOSIS — O09521 Supervision of elderly multigravida, first trimester: Secondary | ICD-10-CM | POA: Diagnosis not present

## 2017-02-05 DIAGNOSIS — O219 Vomiting of pregnancy, unspecified: Secondary | ICD-10-CM | POA: Diagnosis not present

## 2017-02-05 DIAGNOSIS — Z3689 Encounter for other specified antenatal screening: Secondary | ICD-10-CM | POA: Diagnosis not present

## 2017-02-05 LAB — OB RESULTS CONSOLE ABO/RH: RH Type: NEGATIVE

## 2017-02-05 LAB — OB RESULTS CONSOLE GC/CHLAMYDIA
Chlamydia: NEGATIVE
Gonorrhea: NEGATIVE

## 2017-02-05 LAB — OB RESULTS CONSOLE HEPATITIS B SURFACE ANTIGEN: Hepatitis B Surface Ag: NEGATIVE

## 2017-02-05 LAB — OB RESULTS CONSOLE ANTIBODY SCREEN: Antibody Screen: NEGATIVE

## 2017-02-05 LAB — OB RESULTS CONSOLE RPR: RPR: NONREACTIVE

## 2017-02-05 LAB — OB RESULTS CONSOLE HIV ANTIBODY (ROUTINE TESTING): HIV: NONREACTIVE

## 2017-02-05 LAB — OB RESULTS CONSOLE RUBELLA ANTIBODY, IGM: Rubella: IMMUNE

## 2017-02-19 DIAGNOSIS — Z3689 Encounter for other specified antenatal screening: Secondary | ICD-10-CM | POA: Diagnosis not present

## 2017-02-19 DIAGNOSIS — Z3A10 10 weeks gestation of pregnancy: Secondary | ICD-10-CM | POA: Diagnosis not present

## 2017-02-19 DIAGNOSIS — Z118 Encounter for screening for other infectious and parasitic diseases: Secondary | ICD-10-CM | POA: Diagnosis not present

## 2017-02-19 DIAGNOSIS — O09521 Supervision of elderly multigravida, first trimester: Secondary | ICD-10-CM | POA: Diagnosis not present

## 2017-03-06 DIAGNOSIS — Z3A12 12 weeks gestation of pregnancy: Secondary | ICD-10-CM | POA: Diagnosis not present

## 2017-03-06 DIAGNOSIS — Z3682 Encounter for antenatal screening for nuchal translucency: Secondary | ICD-10-CM | POA: Diagnosis not present

## 2017-03-06 DIAGNOSIS — O09521 Supervision of elderly multigravida, first trimester: Secondary | ICD-10-CM | POA: Diagnosis not present

## 2017-03-06 DIAGNOSIS — Z23 Encounter for immunization: Secondary | ICD-10-CM | POA: Diagnosis not present

## 2017-03-27 DIAGNOSIS — Z361 Encounter for antenatal screening for raised alphafetoprotein level: Secondary | ICD-10-CM | POA: Diagnosis not present

## 2017-03-27 DIAGNOSIS — Z3A15 15 weeks gestation of pregnancy: Secondary | ICD-10-CM | POA: Diagnosis not present

## 2017-03-27 DIAGNOSIS — O09522 Supervision of elderly multigravida, second trimester: Secondary | ICD-10-CM | POA: Diagnosis not present

## 2017-03-27 DIAGNOSIS — N898 Other specified noninflammatory disorders of vagina: Secondary | ICD-10-CM | POA: Diagnosis not present

## 2017-03-27 DIAGNOSIS — O26892 Other specified pregnancy related conditions, second trimester: Secondary | ICD-10-CM | POA: Diagnosis not present

## 2017-04-11 DIAGNOSIS — Z363 Encounter for antenatal screening for malformations: Secondary | ICD-10-CM | POA: Diagnosis not present

## 2017-04-11 DIAGNOSIS — O26892 Other specified pregnancy related conditions, second trimester: Secondary | ICD-10-CM | POA: Diagnosis not present

## 2017-04-11 DIAGNOSIS — Z3A18 18 weeks gestation of pregnancy: Secondary | ICD-10-CM | POA: Diagnosis not present

## 2017-05-14 ENCOUNTER — Other Ambulatory Visit: Payer: BLUE CROSS/BLUE SHIELD | Admitting: Internal Medicine

## 2017-05-14 DIAGNOSIS — Z363 Encounter for antenatal screening for malformations: Secondary | ICD-10-CM | POA: Diagnosis not present

## 2017-05-14 DIAGNOSIS — O09522 Supervision of elderly multigravida, second trimester: Secondary | ICD-10-CM | POA: Diagnosis not present

## 2017-05-14 DIAGNOSIS — Z3A22 22 weeks gestation of pregnancy: Secondary | ICD-10-CM | POA: Diagnosis not present

## 2017-05-20 NOTE — Progress Notes (Signed)
Chief Complaint  Patient presents with  . Annual Exam    [redacted] weeks pregnant.     HPI: Patient  Jessica Dudley  42 y.o. comes in today for Preventive Health Care visit   pregnancy 24 weeks    on lovenox.   For risk  In vitro .  No complications at this time . No hx dm hs no bleeding   To be delivered c section 37 weeks  Had flu vaccine .   Taking Extra vit d   Per dr G  Cause of meds  Health Maintenance  Topic Date Due  . PAP SMEAR  05/29/2017 (Originally 05/30/2015)  . TETANUS/TDAP  03/01/2019  . INFLUENZA VACCINE  Completed  . HIV Screening  Completed   Health Maintenance Review LIFESTYLE:  Exercise:   Tobacco/ETS: no Alcohol:  no Sugar beverages: fish vegges       Sleep:  About 8  .    Drug use: no HH of   3   1 dog  Work: [employer]  40  Per week  .   To have c section.  On prenatals.   ROS:  GEN/ HEENT: No fever, significant weight changes sweats headaches vision problems hearing changes, CV/ PULM; No chest pain shortness of breath cough, syncope,edema  change in exercise tolerance. GI /GU: No adominal pain, vomiting, change in bowel habits. No blood in the stool. No significant GU symptoms. SKIN/HEME: ,no acute skin rashes suspicious lesions or bleeding. No lymphadenopathy, nodules, masses.  NEURO/ PSYCH:  No neurologic signs such as weakness numbness. No depression anxiety. IMM/ Allergy: No unusual infections.  Allergy .   REST of 12 system review negative except as per HPI   Past Medical History:  Diagnosis Date  . In vitro fertilization 07/02/2016   Hematology supervision in view of previous PE 12/11/15  . Iron deficiency anemia   . Uterine fibroid   . Wears glasses     Past Surgical History:  Procedure Laterality Date  . CHROMOPERTUBATION N/A 11/29/2015   Procedure: ABDOMINAL MYOMECTOMY;  Surgeon: Governor Specking, MD;  Location: [facility];  Service: Gynecology;  Laterality: N/A;  . MYOMECTOMY    . NO PAST SURGERIES      Family History    Problem Relation Age of Onset  . Hypertension Mother   . Prostate cancer Paternal Uncle   . Diabetes Maternal Grandmother   . Leukemia Cousin   . Brain cancer Cousin   . Deep vein thrombosis Father   . Varicose Veins Father     Social History   Socioeconomic History  . Marital status: Married    Spouse name: None  . Number of children: None  . Years of education: None  . Highest education level: None  Social Needs  . Financial resource strain: None  . Food insecurity - worry: None  . Food insecurity - inability: None  . Transportation needs - medical: None  . Transportation needs - non-medical: None  Occupational History  . None  Tobacco Use  . Smoking status: Never Smoker  . Smokeless tobacco: Never Used  Substance and Sexual Activity  . Alcohol use: No  . Drug use: No  . Sexual activity: Yes    Birth control/protection: None  Other Topics Concern  . None  Social History Narrative   Divorced  Remarried   In vitro to deliver in April   On lovenox   Regular exercise- no   HH of 2 with daughter no  pets       Age 13 daughter    Works [employer]   40 hours    MOM ruby Zellman    Outpatient Medications Prior to Visit  Medication Sig Dispense Refill  . acetaminophen (TYLENOL) 500 MG tablet Take 1,000 mg by mouth every 6 (six) hours as needed for headache.     . enoxaparin (LOVENOX) 150 MG/ML injection Inject 0.67 mLs (100 mg total) into the skin daily. 30 Syringe 6  . Magnesium Oxide (MAG-OX 400 PO) Take 400 mg by mouth daily.    . MULTIPLE VITAMIN PO Take 1 tablet by mouth daily.     . vitamin C (ASCORBIC ACID) 500 MG tablet Take 500 mg by mouth daily.    . Iron, Ferrous Gluconate, 256 (28 Fe) MG TABS Take 256 mg by mouth daily.     . valACYclovir (VALTREX) 500 MG tablet Take 1 tablet (500 mg total) by mouth 2 (two) times daily. For 3-5 days for outbreak (Patient not taking: Reported on 05/21/2017) 30 tablet 5  . cyclobenzaprine (FLEXERIL) 10 MG tablet Take 0.5-1 tablets  (5-10 mg total) by mouth 3 (three) times daily as needed for muscle spasms. For back pain , caution of sedation (Patient not taking: Reported on 05/21/2017) 30 tablet 1  . enoxaparin (LOVENOX) 40 MG/0.4ML injection Inject 0.4 mLs (40 mg total) into the skin daily. (Patient not taking: Reported on 05/21/2017) 90 Syringe 3   No facility-administered medications prior to visit.      EXAM:  BP 122/62 (BP Location: Right Arm, Patient Position: Sitting, Cuff Size: Normal)   Pulse 85   Temp 98.1 F (36.7 C) (Oral)   Ht 5' 1.5" (1.562 m)   Wt 167 lb 12.8 oz (76.1 kg)   LMP 06/30/2016   BMI 31.19 kg/m   Body mass index is 31.19 kg/m. Wt Readings from Last 3 Encounters:  05/21/17 167 lb 12.8 oz (76.1 kg)  01/28/17 151 lb (68.5 kg)  11/24/16 145 lb 1.3 oz (65.8 kg)    Physical Exam: Vital signs reviewed DXI:PJAS is a well-developed well-nourished alert cooperative    who appearsr stated age in no acute distress.  HEENT: normocephalic atraumatic , Eyes: PERRL EOM's full, conjunctiva clear, Nares: paten,t no deformity discharge or tenderness., Ears: no deformity EAC's clear TMs with normal landmarks. Mouth: clear OP, no lesions, edema.  Moist mucous membranes. Dentition in adequate repair. NECK: supple without masses, thyromegaly or bruits. CHEST/PULM:  Clear to auscultation and percussion breath sounds equal no wheeze , rales or rhonchi. No chest wall deformities or tenderness. Breast: normal by inspection . No dimpling, discharge, masses, tenderness or discharge . CV: PMI is nondisplaced, S1 S2 no gallops, murmurs, rubs. Peripheral pulses are full without delay.No JVD .  ABDOMEN: Bowel sounds normal nontender gravid uterus above umbilicus   No guard or rebound, no hepato splenomegal no CVA tenderness.   Extremtities:  No clubbing cyanosis or edema, no acute joint swelling or redness no focal atrophy NEURO:  Oriented x3, cranial nerves 3-12 appear to be intact, no obvious focal weakness,gait  within normal limits no abnormal reflexes or asymmetrical SKIN: No acute rashes normal turgor, color, no bruising or petechiae. PSYCH: Oriented, good eye contact, no obvious depression anxiety, cognition and judgment appear normal. LN: no cervical axillary inguinal adenopathy  Lab Results  Component Value Date   WBC 5.5 07/02/2016   HGB 11.4 07/02/2016   HCT 37.3 07/02/2016   PLT 297 07/02/2016   GLUCOSE 80 02/15/2016  CHOL 165 02/15/2016   TRIG 46.0 02/15/2016   HDL 59.30 02/15/2016   LDLCALC 96 02/15/2016   ALT 13 02/15/2016   AST 12 02/15/2016   NA 139 02/15/2016   K 4.3 02/15/2016   CL 107 02/15/2016   CREATININE 0.96 02/15/2016   BUN 13 02/15/2016   CO2 25 02/15/2016   TSH 0.93 02/15/2016   INR 1.08 12/11/2015    BP Readings from Last 3 Encounters:  05/21/17 122/62  01/28/17 113/74  11/24/16 106/70    Lab results reviewed with patient   ASSESSMENT AND PLAN:  Discussed the following assessment and plan:  Visit for preventive health examination  Hx pulmonary embolism  Encounter for screening examination for impaired glucose regulation and diabetes mellitus - fbs is 77  to scren through ob  High-risk pregnancy in second trimester - Plan: POC Glucose (CBG) reviewed immuniz    To get updated tdap closer to delivery and for caretakers Patient Care Team: Burnis Medin, MD as PCP - General Beryle Beams Alyson Locket, MD as Consulting Physician (Oncology) Governor Specking, MD as Consulting Physician (Obstetrics and Gynecology) Servando Salina, MD as Consulting Physician (Obstetrics and Gynecology) Patient Instructions    Make sure all have had vlu vaccines and  Updated tdap   If contacting  With baby.  You  should get an updated  tdap  As per  OB closer to delivery .   congratulations  And let us know if we can help .      Preventive Care 40-64 Years, Female Preventive care refers to lifestyle choices and visits with your health care provider that can  promote health and wellness. What does preventive care include?  A yearly physical exam. This is also called an annual well check.  Dental exams once or twice a year.  Routine eye exams. Ask your health care provider how often you should have your eyes checked.  Personal lifestyle choices, including: ? Daily care of your teeth and gums. ? Regular physical activity. ? Eating a healthy diet. ? Avoiding tobacco and drug use. ? Limiting alcohol use. ? Practicing safe sex. ? Taking low-dose aspirin daily starting at age 47. ? Taking vitamin and mineral supplements as recommended by your health care provider. What happens during an annual well check? The services and screenings done by your health care provider during your annual well check will depend on your age, overall health, lifestyle risk factors, and family history of disease. Counseling Your health care provider may ask you questions about your:  Alcohol use.  Tobacco use.  Drug use.  Emotional well-being.  Home and relationship well-being.  Sexual activity.  Eating habits.  Work and work Statistician.  Method of birth control.  Menstrual cycle.  Pregnancy history.  Screening You may have the following tests or measurements:  Height, weight, and BMI.  Blood pressure.  Lipid and cholesterol levels. These may be checked every 5 years, or more frequently if you are over 81 years old.  Skin check.  Lung cancer screening. You may have this screening every year starting at age 60 if you have a 30-pack-year history of smoking and currently smoke or have quit within the past 15 years.  Fecal occult blood test (FOBT) of the stool. You may have this test every year starting at age 23.  Flexible sigmoidoscopy or colonoscopy. You may have a sigmoidoscopy every 5 years or a colonoscopy every 10 years starting at age 59.  Hepatitis C blood test.  Hepatitis B  blood test.  Sexually transmitted disease (STD)  testing.  Diabetes screening. This is done by checking your blood sugar (glucose) after you have not eaten for a while (fasting). You may have this done every 1-3 years.  Mammogram. This may be done every 1-2 years. Talk to your health care provider about when you should start having regular mammograms. This may depend on whether you have a family history of breast cancer.  BRCA-related cancer screening. This may be done if you have a family history of breast, ovarian, tubal, or peritoneal cancers.  Pelvic exam and Pap test. This may be done every 3 years starting at age 84. Starting at age 69, this may be done every 5 years if you have a Pap test in combination with an HPV test.  Bone density scan. This is done to screen for osteoporosis. You may have this scan if you are at high risk for osteoporosis.  Discuss your test results, treatment options, and if necessary, the need for more tests with your health care provider. Vaccines Your health care provider may recommend certain vaccines, such as:  Influenza vaccine. This is recommended every year.  Tetanus, diphtheria, and acellular pertussis (Tdap, Td) vaccine. You may need a Td booster every 10 years.  Varicella vaccine. You may need this if you have not been vaccinated.  Zoster vaccine. You may need this after age 30.  Measles, mumps, and rubella (MMR) vaccine. You may need at least one dose of MMR if you were born in 1957 or later. You may also need a second dose.  Pneumococcal 13-valent conjugate (PCV13) vaccine. You may need this if you have certain conditions and were not previously vaccinated.  Pneumococcal polysaccharide (PPSV23) vaccine. You may need one or two doses if you smoke cigarettes or if you have certain conditions.  Meningococcal vaccine. You may need this if you have certain conditions.  Hepatitis A vaccine. You may need this if you have certain conditions or if you travel or work in places where you may be  exposed to hepatitis A.  Hepatitis B vaccine. You may need this if you have certain conditions or if you travel or work in places where you may be exposed to hepatitis B.  Haemophilus influenzae type b (Hib) vaccine. You may need this if you have certain conditions.  Talk to your health care provider about which screenings and vaccines you need and how often you need them. This information is not intended to replace advice given to you by your health care provider. Make sure you discuss any questions you have with your health care provider. Document Released: 05/20/2015 Document Revised: 01/11/2016 Document Reviewed: 02/22/2015 Elsevier Interactive Patient Education  2018 [place]. Panosh M.D.

## 2017-05-21 ENCOUNTER — Ambulatory Visit (INDEPENDENT_AMBULATORY_CARE_PROVIDER_SITE_OTHER): Payer: BLUE CROSS/BLUE SHIELD | Admitting: Internal Medicine

## 2017-05-21 ENCOUNTER — Encounter: Payer: Self-pay | Admitting: Internal Medicine

## 2017-05-21 VITALS — BP 122/62 | HR 85 | Temp 98.1°F | Ht 61.5 in | Wt 167.8 lb

## 2017-05-21 DIAGNOSIS — Z0000 Encounter for general adult medical examination without abnormal findings: Secondary | ICD-10-CM

## 2017-05-21 DIAGNOSIS — Z86711 Personal history of pulmonary embolism: Secondary | ICD-10-CM | POA: Diagnosis not present

## 2017-05-21 DIAGNOSIS — Z131 Encounter for screening for diabetes mellitus: Secondary | ICD-10-CM

## 2017-05-21 DIAGNOSIS — O0992 Supervision of high risk pregnancy, unspecified, second trimester: Secondary | ICD-10-CM

## 2017-05-21 LAB — GLUCOSE, POCT (MANUAL RESULT ENTRY): POC Glucose: 77 mg/dl (ref 70–99)

## 2017-05-21 NOTE — Patient Instructions (Signed)
Make sure all have had vlu vaccines and  Updated tdap   If contacting  With baby.  You  should get an updated  tdap  As per  OB closer to delivery .   congratulations  And let us know if we can help .      Preventive Care 40-64 Years, Female Preventive care refers to lifestyle choices and visits with your health care provider that can promote health and wellness. What does preventive care include?  A yearly physical exam. This is also called an annual well check.  Dental exams once or twice a year.  Routine eye exams. Ask your health care provider how often you should have your eyes checked.  Personal lifestyle choices, including: ? Daily care of your teeth and gums. ? Regular physical activity. ? Eating a healthy diet. ? Avoiding tobacco and drug use. ? Limiting alcohol use. ? Practicing safe sex. ? Taking low-dose aspirin daily starting at age 69. ? Taking vitamin and mineral supplements as recommended by your health care provider. What happens during an annual well check? The services and screenings done by your health care provider during your annual well check will depend on your age, overall health, lifestyle risk factors, and family history of disease. Counseling Your health care provider may ask you questions about your:  Alcohol use.  Tobacco use.  Drug use.  Emotional well-being.  Home and relationship well-being.  Sexual activity.  Eating habits.  Work and work Statistician.  Method of birth control.  Menstrual cycle.  Pregnancy history.  Screening You may have the following tests or measurements:  Height, weight, and BMI.  Blood pressure.  Lipid and cholesterol levels. These may be checked every 5 years, or more frequently if you are over 19 years old.  Skin check.  Lung cancer screening. You may have this screening every year starting at age 71 if you have a 30-pack-year history of smoking and currently smoke or have quit within the  past 15 years.  Fecal occult blood test (FOBT) of the stool. You may have this test every year starting at age 5.  Flexible sigmoidoscopy or colonoscopy. You may have a sigmoidoscopy every 5 years or a colonoscopy every 10 years starting at age 52.  Hepatitis C blood test.  Hepatitis B blood test.  Sexually transmitted disease (STD) testing.  Diabetes screening. This is done by checking your blood sugar (glucose) after you have not eaten for a while (fasting). You may have this done every 1-3 years.  Mammogram. This may be done every 1-2 years. Talk to your health care provider about when you should start having regular mammograms. This may depend on whether you have a family history of breast cancer.  BRCA-related cancer screening. This may be done if you have a family history of breast, ovarian, tubal, or peritoneal cancers.  Pelvic exam and Pap test. This may be done every 3 years starting at age 43. Starting at age 39, this may be done every 5 years if you have a Pap test in combination with an HPV test.  Bone density scan. This is done to screen for osteoporosis. You may have this scan if you are at high risk for osteoporosis.  Discuss your test results, treatment options, and if necessary, the need for more tests with your health care provider. Vaccines Your health care provider may recommend certain vaccines, such as:  Influenza vaccine. This is recommended every year.  Tetanus, diphtheria, and acellular pertussis (Tdap,  Td) vaccine. You may need a Td booster every 10 years.  Varicella vaccine. You may need this if you have not been vaccinated.  Zoster vaccine. You may need this after age 7.  Measles, mumps, and rubella (MMR) vaccine. You may need at least one dose of MMR if you were born in 1957 or later. You may also need a second dose.  Pneumococcal 13-valent conjugate (PCV13) vaccine. You may need this if you have certain conditions and were not previously  vaccinated.  Pneumococcal polysaccharide (PPSV23) vaccine. You may need one or two doses if you smoke cigarettes or if you have certain conditions.  Meningococcal vaccine. You may need this if you have certain conditions.  Hepatitis A vaccine. You may need this if you have certain conditions or if you travel or work in places where you may be exposed to hepatitis A.  Hepatitis B vaccine. You may need this if you have certain conditions or if you travel or work in places where you may be exposed to hepatitis B.  Haemophilus influenzae type b (Hib) vaccine. You may need this if you have certain conditions.  Talk to your health care provider about which screenings and vaccines you need and how often you need them. This information is not intended to replace advice given to you by your health care provider. Make sure you discuss any questions you have with your health care provider. Document Released: 05/20/2015 Document Revised: 01/11/2016 Document Reviewed: 02/22/2015 Elsevier Interactive Patient Education  [employer].

## 2017-05-30 DIAGNOSIS — O09522 Supervision of elderly multigravida, second trimester: Secondary | ICD-10-CM | POA: Diagnosis not present

## 2017-05-30 DIAGNOSIS — Z3A25 25 weeks gestation of pregnancy: Secondary | ICD-10-CM | POA: Diagnosis not present

## 2017-05-30 DIAGNOSIS — O09812 Supervision of pregnancy resulting from assisted reproductive technology, second trimester: Secondary | ICD-10-CM | POA: Diagnosis not present

## 2017-06-03 DIAGNOSIS — Z3A25 25 weeks gestation of pregnancy: Secondary | ICD-10-CM | POA: Diagnosis not present

## 2017-06-03 DIAGNOSIS — B373 Candidiasis of vulva and vagina: Secondary | ICD-10-CM | POA: Diagnosis not present

## 2017-06-03 DIAGNOSIS — O26892 Other specified pregnancy related conditions, second trimester: Secondary | ICD-10-CM | POA: Diagnosis not present

## 2017-06-03 DIAGNOSIS — O43192 Other malformation of placenta, second trimester: Secondary | ICD-10-CM | POA: Diagnosis not present

## 2017-06-24 DIAGNOSIS — O43193 Other malformation of placenta, third trimester: Secondary | ICD-10-CM | POA: Diagnosis not present

## 2017-06-24 DIAGNOSIS — O99013 Anemia complicating pregnancy, third trimester: Secondary | ICD-10-CM | POA: Diagnosis not present

## 2017-06-24 DIAGNOSIS — O09523 Supervision of elderly multigravida, third trimester: Secondary | ICD-10-CM | POA: Diagnosis not present

## 2017-06-24 DIAGNOSIS — Z3A28 28 weeks gestation of pregnancy: Secondary | ICD-10-CM | POA: Diagnosis not present

## 2017-06-24 DIAGNOSIS — Z3689 Encounter for other specified antenatal screening: Secondary | ICD-10-CM | POA: Diagnosis not present

## 2017-06-24 DIAGNOSIS — O3429 Maternal care due to uterine scar from other previous surgery: Secondary | ICD-10-CM | POA: Diagnosis not present

## 2017-06-24 DIAGNOSIS — O360130 Maternal care for anti-D [Rh] antibodies, third trimester, not applicable or unspecified: Secondary | ICD-10-CM | POA: Diagnosis not present

## 2017-07-08 DIAGNOSIS — O3663X0 Maternal care for excessive fetal growth, third trimester, not applicable or unspecified: Secondary | ICD-10-CM | POA: Diagnosis not present

## 2017-07-08 DIAGNOSIS — Z23 Encounter for immunization: Secondary | ICD-10-CM | POA: Diagnosis not present

## 2017-07-08 DIAGNOSIS — Z3A30 30 weeks gestation of pregnancy: Secondary | ICD-10-CM | POA: Diagnosis not present

## 2017-07-11 ENCOUNTER — Other Ambulatory Visit: Payer: Self-pay | Admitting: Obstetrics and Gynecology

## 2017-07-16 ENCOUNTER — Other Ambulatory Visit (INDEPENDENT_AMBULATORY_CARE_PROVIDER_SITE_OTHER): Payer: BLUE CROSS/BLUE SHIELD

## 2017-07-16 DIAGNOSIS — I2699 Other pulmonary embolism without acute cor pulmonale: Secondary | ICD-10-CM

## 2017-07-16 DIAGNOSIS — O0990 Supervision of high risk pregnancy, unspecified, unspecified trimester: Secondary | ICD-10-CM

## 2017-07-16 LAB — CBC WITH DIFFERENTIAL/PLATELET
Basophils Absolute: 0 10*3/uL (ref 0.0–0.1)
Basophils Relative: 0 %
Eosinophils Absolute: 0 10*3/uL (ref 0.0–0.7)
Eosinophils Relative: 0 %
HCT: 31.7 % — ABNORMAL LOW (ref 36.0–46.0)
Hemoglobin: 9.8 g/dL — ABNORMAL LOW (ref 12.0–15.0)
Lymphocytes Relative: 16 %
Lymphs Abs: 1.2 10*3/uL (ref 0.7–4.0)
MCH: 22.1 pg — ABNORMAL LOW (ref 26.0–34.0)
MCHC: 30.9 g/dL (ref 30.0–36.0)
MCV: 71.6 fL — ABNORMAL LOW (ref 78.0–100.0)
Monocytes Absolute: 1 10*3/uL (ref 0.1–1.0)
Monocytes Relative: 13 %
Neutro Abs: 5.5 10*3/uL (ref 1.7–7.7)
Neutrophils Relative %: 71 %
Platelets: 231 10*3/uL (ref 150–400)
RBC: 4.43 MIL/uL (ref 3.87–5.11)
RDW: 15 % (ref 11.5–15.5)
WBC: 7.8 10*3/uL (ref 4.0–10.5)

## 2017-07-16 LAB — COMPREHENSIVE METABOLIC PANEL
ALT: 25 U/L (ref 14–54)
AST: 20 U/L (ref 15–41)
Albumin: 2.8 g/dL — ABNORMAL LOW (ref 3.5–5.0)
Alkaline Phosphatase: 60 U/L (ref 38–126)
Anion gap: 7 (ref 5–15)
BUN: <5 mg/dL — ABNORMAL LOW (ref 6–20)
CO2: 23 mmol/L (ref 22–32)
Calcium: 9.2 mg/dL (ref 8.9–10.3)
Chloride: 107 mmol/L (ref 101–111)
Creatinine, Ser: 0.81 mg/dL (ref 0.44–1.00)
GFR calc Af Amer: >60 mL/min (ref >60)
GFR calc non Af Amer: >60 mL/min (ref >60)
Glucose, Bld: 82 mg/dL (ref 65–99)
Potassium: 3.7 mmol/L (ref 3.5–5.1)
Sodium: 137 mmol/L (ref 135–145)
Total Bilirubin: 0.3 mg/dL (ref 0.3–1.2)
Total Protein: 5.8 g/dL — ABNORMAL LOW (ref 6.5–8.1)

## 2017-07-16 LAB — HEPARIN ANTI-XA: Heparin LMW: 0.55 IU/mL

## 2017-07-16 LAB — LACTATE DEHYDROGENASE: LDH: 143 U/L (ref 98–192)

## 2017-07-23 ENCOUNTER — Ambulatory Visit: Payer: BLUE CROSS/BLUE SHIELD | Admitting: Oncology

## 2017-07-23 ENCOUNTER — Other Ambulatory Visit: Payer: Self-pay

## 2017-07-23 ENCOUNTER — Encounter: Payer: Self-pay | Admitting: Oncology

## 2017-07-23 VITALS — BP 110/67 | HR 92 | Temp 98.1°F | Ht 62.0 in | Wt 170.8 lb

## 2017-07-23 DIAGNOSIS — Z885 Allergy status to narcotic agent status: Secondary | ICD-10-CM

## 2017-07-23 DIAGNOSIS — Z86711 Personal history of pulmonary embolism: Secondary | ICD-10-CM

## 2017-07-23 DIAGNOSIS — Z8742 Personal history of other diseases of the female genital tract: Secondary | ICD-10-CM

## 2017-07-23 DIAGNOSIS — Z7901 Long term (current) use of anticoagulants: Secondary | ICD-10-CM | POA: Diagnosis not present

## 2017-07-23 DIAGNOSIS — O0993 Supervision of high risk pregnancy, unspecified, third trimester: Secondary | ICD-10-CM

## 2017-07-23 DIAGNOSIS — O0990 Supervision of high risk pregnancy, unspecified, unspecified trimester: Secondary | ICD-10-CM | POA: Insufficient documentation

## 2017-07-23 DIAGNOSIS — I2699 Other pulmonary embolism without acute cor pulmonale: Secondary | ICD-10-CM

## 2017-07-23 HISTORY — DX: Personal history of pulmonary embolism: Z86.711

## 2017-07-23 HISTORY — DX: Supervision of high risk pregnancy, unspecified, unspecified trimester: O09.90

## 2017-07-23 MED ORDER — ENOXAPARIN SODIUM 150 MG/ML ~~LOC~~ SOLN: 100.0000 mg | SUBCUTANEOUS | 6 refills | Status: DC

## 2017-07-23 NOTE — Patient Instructions (Signed)
Continue lovenox 100 mg injection daily. Stop 24 hours before C-section - do not take on 4/17. Resume 24 hours after delivery if OK with your Ob/Gyn Return visit with me in May, lab day of visit

## 2017-07-23 NOTE — Progress Notes (Signed)
Hematology and Oncology Follow Up Visit  Jessica Dudley [id number] 11/15/1975 42 y.o. 07/23/2017 2:04 PM   Principle Diagnosis: Encounter Diagnoses  Name Primary?  . High risk pregnancy, antepartum Yes  . Hx of pulmonary embolus   . PE (pulmonary thromboembolism) ([place])   Clinical summary: 42 year old woman I saw for the first time in December 2017. She is divorced and remarried. She had a child with her first husband. She has been trying to get pregnant again with her new husband but having difficulty. She developed low volume, subsegmental vessel, right lower lobe pulmonary emboli 11 days after an abdominal myomectomy to resect uterine fibroids. She was anticoagulated with Xarelto for 3 months. Please see my 12/17 office consultation for complete details. At time of that visit I did screen her for some of the major congenital and acquired coagulopathies and she tested negative for factor V Leiden and prothrombin gene mutations, negative for anticardiolipin antibodies, negative antibodies against beta-2 glycoprotein 1, negative lupus type anticoagulant. Normal Antithrombin level. She initiated an in vitro fertilization process. Due to the requirements of hormone injections, I did advise prophylactic dose Lovenox. The in vitro fertilization procedure was successful.  I advised her to stay on prophylactic dose Lovenox until the third trimester I then increased her up to therapeutic dose to cover the period of maximum risk of thrombosis.  Current dose 100 mg subcu daily.  She is taking a vitamin D supplement.  She is drinking soy milk which she says is high in calcium.  Interim History: Pregnancy is proceeding uneventfully.  She denies any dyspnea, chest pain, palpitations.  She had one episode last night where she thought her heart was racing.  This was self-limited.  No leg swelling or calf pain. She is going to have a planned C-section on April 18. Labs done on March 12 shows that she  has a microcytic anemia with hemoglobin 9.8 and she was started on a iron supplement by her OB. She has felt much more fatigued during this pregnancy than her first pregnancy.  Medications: reviewed  Allergies:  Allergies  Allergen Reactions  . Norco [Hydrocodone-Acetaminophen] Nausea And Vomiting  . Oxycodone-Acetaminophen Nausea And Vomiting    Review of Systems: See interim history Remaining ROS negative:   Physical Exam: Blood pressure 110/67, pulse 92, temperature 98.1 F (36.7 C), temperature source Oral, height 5\' 2"  (1.575 m), weight 170 lb 12.8 oz (77.5 kg), SpO2 100 %. Wt Readings from Last 3 Encounters:  07/23/17 170 lb 12.8 oz (77.5 kg)  05/21/17 167 lb 12.8 oz (76.1 kg)  01/28/17 151 lb (68.5 kg)     General appearance: Well-nourished African-American woman in her third trimester HENNT: Pharynx no erythema, exudate, mass, or ulcer. No thyromegaly or thyroid nodules Lymph nodes: No cervical, supraclavicular, or axillary lymphadenopathy Breasts:  Lungs: Clear to auscultation, resonant to percussion throughout Heart: Regular rhythm, no murmur, no gallop, no rub, no click, no edema Abdomen: Third trimester pregnancy Extremities: No edema, no calf tenderness Musculoskeletal: no joint deformities GU:  Vascular:  Neurologic: Alert, oriented, PERRLA, optic discs sharp and vessels normal, no hemorrhage or exudate, on the right, left disc not visualized, cranial nerves grossly normal, motor strength 5 over 5, reflexes 1+ symmetric, upper body coordination normal, gait normal, Skin: No rash or ecchymosis  Lab Results: CBC W/Diff    Component Value Date/Time   WBC 7.8 07/16/2017 1143   RBC 4.43 07/16/2017 1143   HGB 9.8 (L) 07/16/2017 1143   HGB 11.4 07/02/2016  1431   HCT 31.7 (L) 07/16/2017 1143   HCT 37.3 07/02/2016 1431   PLT 231 07/16/2017 1143   PLT 297 07/02/2016 1431   MCV 71.6 (L) 07/16/2017 1143   MCV 68 (L) 07/02/2016 1431   MCH 22.1 (L) 07/16/2017 1143    MCHC 30.9 07/16/2017 1143   RDW 15.0 07/16/2017 1143   RDW 19.0 (H) 07/02/2016 1431   LYMPHSABS 1.2 07/16/2017 1143   LYMPHSABS 1.5 07/02/2016 1431   MONOABS 1.0 07/16/2017 1143   EOSABS 0.0 07/16/2017 1143   EOSABS 0.0 07/02/2016 1431   BASOSABS 0.0 07/16/2017 1143   BASOSABS 0.0 07/02/2016 1431     Chemistry      Component Value Date/Time   NA 137 07/16/2017 1143   K 3.7 07/16/2017 1143   CL 107 07/16/2017 1143   CO2 23 07/16/2017 1143   BUN <5 (L) 07/16/2017 1143   CREATININE 0.81 07/16/2017 1143      Component Value Date/Time   CALCIUM 9.2 07/16/2017 1143   ALKPHOS 60 07/16/2017 1143   AST 20 07/16/2017 1143   ALT 25 07/16/2017 1143   BILITOT 0.3 07/16/2017 1143       Radiological Studies: No results found.  Impression:  Third trimester second pregnancy with prior history of a provoked low volume pulmonary embolus. She will continue therapeutic dose Lovenox.  Stop and do not take a dose on the day before planned C-section, April 17.  If stable 24 hours post op, resume Lovenox and continue for 6 weeks postpartum. I do not feel that she needs to be on long-term anticoagulation. I gave her my cell phone number in case her doctors are she need to get in touch with me when she gets admitted.  CC: Patient Care Team: Panosh, Standley Brooking, MD as PCP - General Beryle Beams, Alyson Locket, MD as Consulting Physician (Oncology) Governor Specking, MD as Consulting Physician (Obstetrics and Gynecology) Servando Salina, MD as Consulting Physician (Obstetrics and Gynecology)   Murriel Hopper, MD, [place]  Hematology-Oncology/Internal Medicine [phone]    3/19/20192:04 PM

## 2017-07-24 DIAGNOSIS — O3663X0 Maternal care for excessive fetal growth, third trimester, not applicable or unspecified: Secondary | ICD-10-CM | POA: Diagnosis not present

## 2017-07-24 DIAGNOSIS — Z3A32 32 weeks gestation of pregnancy: Secondary | ICD-10-CM | POA: Diagnosis not present

## 2017-08-08 DIAGNOSIS — Z3685 Encounter for antenatal screening for Streptococcus B: Secondary | ICD-10-CM | POA: Diagnosis not present

## 2017-08-08 DIAGNOSIS — O3663X0 Maternal care for excessive fetal growth, third trimester, not applicable or unspecified: Secondary | ICD-10-CM | POA: Diagnosis not present

## 2017-08-08 DIAGNOSIS — Z3A35 35 weeks gestation of pregnancy: Secondary | ICD-10-CM | POA: Diagnosis not present

## 2017-08-08 DIAGNOSIS — D649 Anemia, unspecified: Secondary | ICD-10-CM | POA: Diagnosis not present

## 2017-08-13 ENCOUNTER — Encounter (HOSPITAL_COMMUNITY): Payer: Self-pay | Admitting: *Deleted

## 2017-08-15 DIAGNOSIS — Z3A36 36 weeks gestation of pregnancy: Secondary | ICD-10-CM | POA: Diagnosis not present

## 2017-08-15 DIAGNOSIS — O3663X0 Maternal care for excessive fetal growth, third trimester, not applicable or unspecified: Secondary | ICD-10-CM | POA: Diagnosis not present

## 2017-08-19 ENCOUNTER — Telehealth: Payer: Self-pay | Admitting: Internal Medicine

## 2017-08-19 DIAGNOSIS — Z3A36 36 weeks gestation of pregnancy: Secondary | ICD-10-CM | POA: Diagnosis not present

## 2017-08-19 NOTE — Telephone Encounter (Signed)
Talked to Dr Beryle Beams - "Stop 24 hours before C-section - do not take on 4/17. Resume 24 hours after delivery if OK with your Ob/Gyn."  Called pt - no answer;left message to give me a call  back.

## 2017-08-19 NOTE — Telephone Encounter (Signed)
Patient is calling to see when does she suppose to stop Lovenox, patient is having a c-section on 08/22/2017. Pls contact patient

## 2017-08-19 NOTE — Telephone Encounter (Signed)
Call from pt - informed to stop Lovenox 24 hrs before the c-section and re-start 24 hrs after delivery if ok with her OB/GYN doctor. Stated her doctor had stated the same orders but she just wanted to be sure.

## 2017-08-19 NOTE — Telephone Encounter (Signed)
Reinforced original rec Thanks Drg

## 2017-08-20 DIAGNOSIS — Z3A36 36 weeks gestation of pregnancy: Secondary | ICD-10-CM | POA: Diagnosis not present

## 2017-08-20 NOTE — Pre-Procedure Instructions (Signed)
Dr Smith Robert notified of OR time and lovenox schedule of pt.  Plan received to have pt take her am dose of lovenox on 4/17 no later than 0600 to ensure >24 hours before surgery.  Pt notified by phone with verbalized understanding.

## 2017-08-20 NOTE — Patient Instructions (Signed)
ALVERDA NAZZARO  08/20/2017   Your procedure is scheduled on:  08/22/2017  Enter through the Main Entrance of [facility] at [place] up the phone at the desk and dial [phone]  Call this number if you have problems the morning of surgery:[phone]  Remember:   Do not eat food:(After Midnight) Desps de medianoche.  Do not drink clear liquids: (After Midnight) Desps de medianoche.  Take these medicines the morning of surgery with A SIP OF WATER: do not take any lovenox on the morning of surgery   Do not wear jewelry, make-up or nail polish.  Do not wear lotions, powders, or perfumes. Do not wear deodorant.  Do not shave 48 hours prior to surgery.  Do not bring valuables to the hospital.  [facility] is not   responsible for any belongings or valuables brought to the hospital.  Contacts, dentures or bridgework may not be worn into surgery.  Leave suitcase in the car. After surgery it may be brought to your room.  For patients admitted to the hospital, checkout time is 11:00 AM the day of              discharge.    N/A   Please read over the following fact sheets that you were given:   Surgical Site Infection Prevention

## 2017-08-21 ENCOUNTER — Encounter (HOSPITAL_COMMUNITY)
Admission: RE | Admit: 2017-08-21 | Discharge: 2017-08-21 | Disposition: A | Discharge status: Home / Self Care | Payer: BLUE CROSS/BLUE SHIELD | Source: Ambulatory Visit | Attending: Obstetrics and Gynecology | Admitting: Obstetrics and Gynecology

## 2017-08-21 DIAGNOSIS — D62 Acute posthemorrhagic anemia: Secondary | ICD-10-CM | POA: Diagnosis not present

## 2017-08-21 DIAGNOSIS — D259 Leiomyoma of uterus, unspecified: Secondary | ICD-10-CM | POA: Diagnosis not present

## 2017-08-21 DIAGNOSIS — N838 Other noninflammatory disorders of ovary, fallopian tube and broad ligament: Secondary | ICD-10-CM | POA: Diagnosis not present

## 2017-08-21 DIAGNOSIS — O1205 Gestational edema, complicating the puerperium: Secondary | ICD-10-CM | POA: Diagnosis not present

## 2017-08-21 DIAGNOSIS — O3429 Maternal care due to uterine scar from other previous surgery: Secondary | ICD-10-CM | POA: Diagnosis not present

## 2017-08-21 DIAGNOSIS — D252 Subserosal leiomyoma of uterus: Secondary | ICD-10-CM | POA: Diagnosis not present

## 2017-08-21 DIAGNOSIS — O09523 Supervision of elderly multigravida, third trimester: Secondary | ICD-10-CM | POA: Diagnosis not present

## 2017-08-21 DIAGNOSIS — O9081 Anemia of the puerperium: Secondary | ICD-10-CM | POA: Diagnosis not present

## 2017-08-21 DIAGNOSIS — R109 Unspecified abdominal pain: Secondary | ICD-10-CM | POA: Diagnosis not present

## 2017-08-21 DIAGNOSIS — Z86711 Personal history of pulmonary embolism: Secondary | ICD-10-CM | POA: Diagnosis not present

## 2017-08-21 DIAGNOSIS — Z3A36 36 weeks gestation of pregnancy: Secondary | ICD-10-CM | POA: Diagnosis not present

## 2017-08-21 DIAGNOSIS — O9832 Other infections with a predominantly sexual mode of transmission complicating childbirth: Secondary | ICD-10-CM | POA: Diagnosis not present

## 2017-08-21 DIAGNOSIS — Z302 Encounter for sterilization: Secondary | ICD-10-CM | POA: Diagnosis not present

## 2017-08-21 DIAGNOSIS — O43123 Velamentous insertion of umbilical cord, third trimester: Secondary | ICD-10-CM | POA: Diagnosis present

## 2017-08-21 DIAGNOSIS — Z3A37 37 weeks gestation of pregnancy: Secondary | ICD-10-CM | POA: Diagnosis not present

## 2017-08-21 DIAGNOSIS — R140 Abdominal distension (gaseous): Secondary | ICD-10-CM | POA: Diagnosis not present

## 2017-08-21 DIAGNOSIS — Z6791 Unspecified blood type, Rh negative: Secondary | ICD-10-CM | POA: Diagnosis not present

## 2017-08-21 DIAGNOSIS — O3413 Maternal care for benign tumor of corpus uteri, third trimester: Secondary | ICD-10-CM | POA: Diagnosis not present

## 2017-08-21 DIAGNOSIS — K5900 Constipation, unspecified: Secondary | ICD-10-CM | POA: Diagnosis not present

## 2017-08-21 DIAGNOSIS — O3483 Maternal care for other abnormalities of pelvic organs, third trimester: Secondary | ICD-10-CM | POA: Diagnosis not present

## 2017-08-21 DIAGNOSIS — A6000 Herpesviral infection of urogenital system, unspecified: Secondary | ICD-10-CM | POA: Diagnosis present

## 2017-08-21 DIAGNOSIS — D251 Intramural leiomyoma of uterus: Secondary | ICD-10-CM | POA: Diagnosis not present

## 2017-08-21 DIAGNOSIS — O26893 Other specified pregnancy related conditions, third trimester: Secondary | ICD-10-CM | POA: Diagnosis not present

## 2017-08-21 HISTORY — DX: Polycystic ovarian syndrome: E28.2

## 2017-08-21 LAB — CBC
HCT: 30.8 % — ABNORMAL LOW (ref 36.0–46.0)
Hemoglobin: 10.1 g/dL — ABNORMAL LOW (ref 12.0–15.0)
MCH: 23 pg — ABNORMAL LOW (ref 26.0–34.0)
MCHC: 32.8 g/dL (ref 30.0–36.0)
MCV: 70.2 fL — ABNORMAL LOW (ref 78.0–100.0)
Platelets: 229 10*3/uL (ref 150–400)
RBC: 4.39 MIL/uL (ref 3.87–5.11)
RDW: 15.3 % (ref 11.5–15.5)
WBC: 10.9 10*3/uL — ABNORMAL HIGH (ref 4.0–10.5)

## 2017-08-22 ENCOUNTER — Inpatient Hospital Stay (HOSPITAL_COMMUNITY): Payer: BLUE CROSS/BLUE SHIELD | Admitting: Anesthesiology

## 2017-08-22 ENCOUNTER — Encounter (HOSPITAL_COMMUNITY)
Admission: RE | Disposition: A | Discharge status: Home / Self Care | Payer: Self-pay | Source: Ambulatory Visit | Attending: Obstetrics and Gynecology

## 2017-08-22 ENCOUNTER — Encounter (HOSPITAL_COMMUNITY): Payer: Self-pay | Admitting: Family Medicine

## 2017-08-22 ENCOUNTER — Inpatient Hospital Stay (HOSPITAL_COMMUNITY)
Admission: RE | Admit: 2017-08-22 | Discharge: 2017-08-26 | DRG: 784 | Disposition: A | Discharge status: Home / Self Care | Payer: BLUE CROSS/BLUE SHIELD | Source: Ambulatory Visit | Attending: Obstetrics and Gynecology | Admitting: Obstetrics and Gynecology

## 2017-08-22 ENCOUNTER — Inpatient Hospital Stay (HOSPITAL_COMMUNITY)
Admission: AD | Admit: 2017-08-22 | Payer: BLUE CROSS/BLUE SHIELD | Source: Ambulatory Visit | Admitting: Obstetrics and Gynecology

## 2017-08-22 DIAGNOSIS — O9832 Other infections with a predominantly sexual mode of transmission complicating childbirth: Secondary | ICD-10-CM | POA: Diagnosis present

## 2017-08-22 DIAGNOSIS — Z86711 Personal history of pulmonary embolism: Secondary | ICD-10-CM

## 2017-08-22 DIAGNOSIS — O1205 Gestational edema, complicating the puerperium: Secondary | ICD-10-CM | POA: Diagnosis not present

## 2017-08-22 DIAGNOSIS — O9081 Anemia of the puerperium: Secondary | ICD-10-CM | POA: Diagnosis not present

## 2017-08-22 DIAGNOSIS — O3413 Maternal care for benign tumor of corpus uteri, third trimester: Secondary | ICD-10-CM | POA: Diagnosis present

## 2017-08-22 DIAGNOSIS — A6000 Herpesviral infection of urogenital system, unspecified: Secondary | ICD-10-CM | POA: Diagnosis present

## 2017-08-22 DIAGNOSIS — N838 Other noninflammatory disorders of ovary, fallopian tube and broad ligament: Secondary | ICD-10-CM | POA: Diagnosis present

## 2017-08-22 DIAGNOSIS — Z302 Encounter for sterilization: Secondary | ICD-10-CM | POA: Diagnosis not present

## 2017-08-22 DIAGNOSIS — D252 Subserosal leiomyoma of uterus: Secondary | ICD-10-CM | POA: Diagnosis present

## 2017-08-22 DIAGNOSIS — D62 Acute posthemorrhagic anemia: Secondary | ICD-10-CM | POA: Diagnosis not present

## 2017-08-22 DIAGNOSIS — R140 Abdominal distension (gaseous): Secondary | ICD-10-CM | POA: Diagnosis not present

## 2017-08-22 DIAGNOSIS — Z6791 Unspecified blood type, Rh negative: Secondary | ICD-10-CM

## 2017-08-22 DIAGNOSIS — O43123 Velamentous insertion of umbilical cord, third trimester: Secondary | ICD-10-CM | POA: Diagnosis present

## 2017-08-22 DIAGNOSIS — Z3A37 37 weeks gestation of pregnancy: Secondary | ICD-10-CM

## 2017-08-22 DIAGNOSIS — K5900 Constipation, unspecified: Secondary | ICD-10-CM | POA: Diagnosis not present

## 2017-08-22 DIAGNOSIS — O3429 Maternal care due to uterine scar from other previous surgery: Principal | ICD-10-CM | POA: Diagnosis present

## 2017-08-22 DIAGNOSIS — O26893 Other specified pregnancy related conditions, third trimester: Secondary | ICD-10-CM | POA: Diagnosis present

## 2017-08-22 DIAGNOSIS — R1911 Absent bowel sounds: Secondary | ICD-10-CM

## 2017-08-22 DIAGNOSIS — R109 Unspecified abdominal pain: Secondary | ICD-10-CM | POA: Diagnosis not present

## 2017-08-22 DIAGNOSIS — O3483 Maternal care for other abnormalities of pelvic organs, third trimester: Secondary | ICD-10-CM | POA: Diagnosis present

## 2017-08-22 DIAGNOSIS — D251 Intramural leiomyoma of uterus: Secondary | ICD-10-CM | POA: Diagnosis present

## 2017-08-22 DIAGNOSIS — D259 Leiomyoma of uterus, unspecified: Secondary | ICD-10-CM | POA: Diagnosis not present

## 2017-08-22 HISTORY — PX: MYOMECTOMY: SHX85

## 2017-08-22 LAB — SYPHILIS: RPR W/REFLEX TO RPR TITER AND TREPONEMAL ANTIBODIES, TRADITIONAL SCREENING AND DIAGNOSIS ALGORITHM: RPR Ser Ql: NONREACTIVE

## 2017-08-22 SURGERY — Surgical Case
Anesthesia: Spinal

## 2017-08-22 MED ORDER — SIMETHICONE 80 MG PO CHEW
80.0000 mg | CHEWABLE_TABLET | ORAL | Status: DC
  Administered 2017-08-22 – 2017-08-25 (×4): 80 mg via ORAL
  Filled 2017-08-22 (×3): qty 1

## 2017-08-22 MED ORDER — SODIUM CHLORIDE 0.9 % IJ SOLN
INTRAMUSCULAR | Status: AC
  Filled 2017-08-22: qty 20

## 2017-08-22 MED ORDER — PRENATAL MULTIVITAMIN CH
1.0000 | ORAL_TABLET | Freq: Every day | ORAL | Status: DC
  Administered 2017-08-23 – 2017-08-25 (×3): 1 via ORAL
  Filled 2017-08-22 (×3): qty 1

## 2017-08-22 MED ORDER — MORPHINE SULFATE (PF) 0.5 MG/ML IJ SOLN
INTRAMUSCULAR | Status: AC
  Filled 2017-08-22: qty 10

## 2017-08-22 MED ORDER — METOCLOPRAMIDE HCL 5 MG/ML IJ SOLN
INTRAMUSCULAR | Status: AC
  Filled 2017-08-22: qty 2

## 2017-08-22 MED ORDER — MORPHINE SULFATE (PF) 0.5 MG/ML IJ SOLN
INTRAMUSCULAR | Status: DC | PRN
  Administered 2017-08-22: .2 mg via INTRATHECAL

## 2017-08-22 MED ORDER — SODIUM CHLORIDE 0.9% FLUSH: 3.0000 mL | INTRAVENOUS | Status: DC | PRN

## 2017-08-22 MED ORDER — SIMETHICONE 80 MG PO CHEW
80.0000 mg | CHEWABLE_TABLET | Freq: Three times a day (TID) | ORAL | Status: DC
  Administered 2017-08-22 – 2017-08-26 (×9): 80 mg via ORAL
  Filled 2017-08-22 (×9): qty 1

## 2017-08-22 MED ORDER — BUPIVACAINE IN DEXTROSE 0.75-8.25 % IT SOLN
INTRATHECAL | Status: DC | PRN
  Administered 2017-08-22: 10.5 mg via INTRATHECAL

## 2017-08-22 MED ORDER — COCONUT OIL OIL
1.0000 "application " | TOPICAL_OIL | Status: DC | PRN
  Administered 2017-08-24: 1 via TOPICAL
  Filled 2017-08-22: qty 120

## 2017-08-22 MED ORDER — KETOROLAC TROMETHAMINE 30 MG/ML IJ SOLN: 30.0000 mg | Freq: Four times a day (QID) | INTRAMUSCULAR | Status: AC | PRN

## 2017-08-22 MED ORDER — LACTATED RINGERS IV SOLN
INTRAVENOUS | Status: DC
  Administered 2017-08-22 (×2): via INTRAVENOUS

## 2017-08-22 MED ORDER — OXYCODONE HCL 5 MG PO TABS: 5.0000 mg | ORAL_TABLET | ORAL | Status: DC | PRN

## 2017-08-22 MED ORDER — FENTANYL CITRATE (PF) 100 MCG/2ML IJ SOLN
INTRAMUSCULAR | Status: AC
  Administered 2017-08-22: 25 ug via INTRAVENOUS
  Filled 2017-08-22: qty 2

## 2017-08-22 MED ORDER — SIMETHICONE 80 MG PO CHEW
80.0000 mg | CHEWABLE_TABLET | ORAL | Status: DC | PRN
Start: 2017-08-22 — End: 2017-08-26
  Administered 2017-08-25 (×2): 80 mg via ORAL
  Filled 2017-08-22 (×2): qty 1

## 2017-08-22 MED ORDER — DIPHENHYDRAMINE HCL 50 MG/ML IJ SOLN: 12.5000 mg | INTRAMUSCULAR | Status: DC | PRN

## 2017-08-22 MED ORDER — OXYCODONE HCL 5 MG PO TABS: 10.0000 mg | ORAL_TABLET | ORAL | Status: DC | PRN

## 2017-08-22 MED ORDER — METOCLOPRAMIDE HCL 5 MG/ML IJ SOLN
10.0000 mg | Freq: Once | INTRAMUSCULAR | Status: AC | PRN
  Administered 2017-08-22: 10 mg via INTRAVENOUS

## 2017-08-22 MED ORDER — NALBUPHINE HCL 10 MG/ML IJ SOLN: 5.0000 mg | INTRAMUSCULAR | Status: DC | PRN

## 2017-08-22 MED ORDER — PHENYLEPHRINE 8 MG IN D5W 100 ML (0.08MG/ML) PREMIX OPTIME
INJECTION | INTRAVENOUS | Status: AC
  Filled 2017-08-22: qty 100

## 2017-08-22 MED ORDER — ENOXAPARIN SODIUM 150 MG/ML ~~LOC~~ SOLN
100.0000 mg | SUBCUTANEOUS | Status: DC
  Filled 2017-08-22 (×2): qty 0.67

## 2017-08-22 MED ORDER — OXYTOCIN 10 UNIT/ML IJ SOLN
INTRAMUSCULAR | Status: AC
  Filled 2017-08-22: qty 4

## 2017-08-22 MED ORDER — MEPERIDINE HCL 25 MG/ML IJ SOLN: 6.2500 mg | INTRAMUSCULAR | Status: DC | PRN

## 2017-08-22 MED ORDER — DIBUCAINE 1 % RE OINT
1.0000 "application " | TOPICAL_OINTMENT | RECTAL | Status: DC | PRN
  Administered 2017-08-25: 1 via RECTAL
  Filled 2017-08-22: qty 28

## 2017-08-22 MED ORDER — EPHEDRINE SULFATE-NACL 50-0.9 MG/10ML-% IV SOSY
PREFILLED_SYRINGE | INTRAVENOUS | Status: DC | PRN
  Administered 2017-08-22: 5 mg via INTRAVENOUS

## 2017-08-22 MED ORDER — VITAMIN C 500 MG PO TABS
500.0000 mg | ORAL_TABLET | Freq: Every day | ORAL | Status: DC
  Administered 2017-08-23 – 2017-08-26 (×4): 500 mg via ORAL
  Filled 2017-08-22 (×5): qty 1

## 2017-08-22 MED ORDER — NALBUPHINE HCL 10 MG/ML IJ SOLN: 5.0000 mg | Freq: Once | INTRAMUSCULAR | Status: DC | PRN

## 2017-08-22 MED ORDER — MENTHOL 3 MG MT LOZG: 1.0000 | LOZENGE | OROMUCOSAL | Status: DC | PRN

## 2017-08-22 MED ORDER — PHENYLEPHRINE 8 MG IN D5W 100 ML (0.08MG/ML) PREMIX OPTIME
INJECTION | INTRAVENOUS | Status: DC | PRN
  Administered 2017-08-22: 70 ug/min via INTRAVENOUS

## 2017-08-22 MED ORDER — METHYLERGONOVINE MALEATE 0.2 MG PO TABS: 0.2000 mg | ORAL_TABLET | ORAL | Status: DC | PRN

## 2017-08-22 MED ORDER — IBUPROFEN 600 MG PO TABS
600.0000 mg | ORAL_TABLET | Freq: Four times a day (QID) | ORAL | Status: DC
  Administered 2017-08-23 – 2017-08-24 (×4): 600 mg via ORAL
  Filled 2017-08-22 (×7): qty 1

## 2017-08-22 MED ORDER — FENTANYL CITRATE (PF) 100 MCG/2ML IJ SOLN
INTRAMUSCULAR | Status: AC
  Filled 2017-08-22: qty 2

## 2017-08-22 MED ORDER — ACETAMINOPHEN 500 MG PO TABS
1000.0000 mg | ORAL_TABLET | Freq: Four times a day (QID) | ORAL | Status: DC
  Administered 2017-08-22 – 2017-08-23 (×3): 1000 mg via ORAL
  Filled 2017-08-22 (×3): qty 2

## 2017-08-22 MED ORDER — ONDANSETRON HCL 4 MG/2ML IJ SOLN
INTRAMUSCULAR | Status: AC
  Filled 2017-08-22: qty 2

## 2017-08-22 MED ORDER — DIPHENHYDRAMINE HCL 25 MG PO CAPS
25.0000 mg | ORAL_CAPSULE | ORAL | Status: DC | PRN
  Filled 2017-08-22: qty 1

## 2017-08-22 MED ORDER — TRIAMCINOLONE ACETONIDE 40 MG/ML IJ SUSP
INTRAMUSCULAR | Status: DC | PRN
  Administered 2017-08-22: 40 mg via INTRAMUSCULAR

## 2017-08-22 MED ORDER — WITCH HAZEL-GLYCERIN EX PADS
1.0000 "application " | MEDICATED_PAD | CUTANEOUS | Status: DC | PRN
  Administered 2017-08-25: 1 via TOPICAL

## 2017-08-22 MED ORDER — LACTATED RINGERS IV SOLN
INTRAVENOUS | Status: DC
  Administered 2017-08-22 (×3): via INTRAVENOUS

## 2017-08-22 MED ORDER — OXYTOCIN 40 UNITS IN LACTATED RINGERS INFUSION - SIMPLE MED: 2.5000 [IU]/h | INTRAVENOUS | Status: DC

## 2017-08-22 MED ORDER — KETOROLAC TROMETHAMINE 30 MG/ML IJ SOLN
30.0000 mg | Freq: Four times a day (QID) | INTRAMUSCULAR | Status: AC | PRN
  Administered 2017-08-23: 30 mg via INTRAVENOUS
  Filled 2017-08-22: qty 1

## 2017-08-22 MED ORDER — ONDANSETRON HCL 4 MG/2ML IJ SOLN
INTRAMUSCULAR | Status: DC | PRN
  Administered 2017-08-22: 4 mg via INTRAVENOUS

## 2017-08-22 MED ORDER — NALOXONE HCL 0.4 MG/ML IJ SOLN: 0.4000 mg | INTRAMUSCULAR | Status: DC | PRN

## 2017-08-22 MED ORDER — ONDANSETRON HCL 4 MG/2ML IJ SOLN
4.0000 mg | Freq: Three times a day (TID) | INTRAMUSCULAR | Status: DC | PRN
  Administered 2017-08-22 – 2017-08-23 (×2): 4 mg via INTRAVENOUS
  Filled 2017-08-22 (×2): qty 2

## 2017-08-22 MED ORDER — DIPHENHYDRAMINE HCL 25 MG PO CAPS: 25.0000 mg | ORAL_CAPSULE | Freq: Four times a day (QID) | ORAL | Status: DC | PRN

## 2017-08-22 MED ORDER — SCOPOLAMINE 1 MG/3DAYS TD PT72
1.0000 | MEDICATED_PATCH | Freq: Once | TRANSDERMAL | Status: AC
  Administered 2017-08-22: 1.5 mg via TRANSDERMAL
  Filled 2017-08-22: qty 1

## 2017-08-22 MED ORDER — LACTATED RINGERS IV SOLN: INTRAVENOUS | Status: DC

## 2017-08-22 MED ORDER — SODIUM CHLORIDE 0.9 % IR SOLN
Status: DC | PRN
  Administered 2017-08-22: 1

## 2017-08-22 MED ORDER — FENTANYL CITRATE (PF) 100 MCG/2ML IJ SOLN
25.0000 ug | INTRAMUSCULAR | Status: DC | PRN
  Administered 2017-08-22: 25 ug via INTRAVENOUS
  Administered 2017-08-22: 50 ug via INTRAVENOUS

## 2017-08-22 MED ORDER — ZOLPIDEM TARTRATE 5 MG PO TABS: 5.0000 mg | ORAL_TABLET | Freq: Every evening | ORAL | Status: DC | PRN

## 2017-08-22 MED ORDER — CEFAZOLIN SODIUM-DEXTROSE 2-4 GM/100ML-% IV SOLN
2.0000 g | INTRAVENOUS | Status: AC
  Administered 2017-08-22: 2 g via INTRAVENOUS

## 2017-08-22 MED ORDER — OXYTOCIN 10 UNIT/ML IJ SOLN
INTRAMUSCULAR | Status: DC | PRN
  Administered 2017-08-22: 40 [IU] via INTRAVENOUS

## 2017-08-22 MED ORDER — NALOXONE HCL 4 MG/10ML IJ SOLN: 1.0000 ug/kg/h | INTRAVENOUS | Status: DC | PRN

## 2017-08-22 MED ORDER — FENTANYL CITRATE (PF) 100 MCG/2ML IJ SOLN
INTRAMUSCULAR | Status: DC | PRN
  Administered 2017-08-22: 10 ug via INTRATHECAL
  Administered 2017-08-22 (×2): 50 ug via INTRAVENOUS

## 2017-08-22 MED ORDER — METHYLERGONOVINE MALEATE 0.2 MG/ML IJ SOLN: 0.2000 mg | INTRAMUSCULAR | Status: DC | PRN

## 2017-08-22 MED ORDER — EPHEDRINE 5 MG/ML INJ
INTRAVENOUS | Status: AC
  Filled 2017-08-22: qty 10

## 2017-08-22 MED ORDER — BUPIVACAINE HCL (PF) 0.25 % IJ SOLN
INTRAMUSCULAR | Status: DC | PRN
  Administered 2017-08-22: 20 mL
  Administered 2017-08-22: 10 mL

## 2017-08-22 MED ORDER — BUPIVACAINE HCL (PF) 0.25 % IJ SOLN
INTRAMUSCULAR | Status: AC
  Filled 2017-08-22: qty 30

## 2017-08-22 MED ORDER — SENNOSIDES-DOCUSATE SODIUM 8.6-50 MG PO TABS
2.0000 | ORAL_TABLET | ORAL | Status: DC
  Administered 2017-08-22 – 2017-08-24 (×3): 2 via ORAL
  Filled 2017-08-22 (×3): qty 2

## 2017-08-22 MED ORDER — LACTATED RINGERS IV SOLN
INTRAVENOUS | Status: DC | PRN
  Administered 2017-08-22: 08:00:00 via INTRAVENOUS

## 2017-08-22 MED ORDER — TRIAMCINOLONE ACETONIDE 40 MG/ML IJ SUSP
40.0000 mg | Freq: Once | INTRAMUSCULAR | Status: DC
  Filled 2017-08-22: qty 1

## 2017-08-22 SURGICAL SUPPLY — 54 items
APL SKNCLS STERI-STRIP NONHPOA (GAUZE/BANDAGES/DRESSINGS) ×1
BARRIER ADHS 3X4 INTERCEED (GAUZE/BANDAGES/DRESSINGS) ×2 IMPLANT
BENZOIN TINCTURE PRP APPL 2/3 (GAUZE/BANDAGES/DRESSINGS) ×1 IMPLANT
BLADE 10 SAFETY STRL DISP (BLADE) ×1 IMPLANT
BRR ADH 4X3 ABS CNTRL BYND (GAUZE/BANDAGES/DRESSINGS) ×1
CLAMP CORD UMBIL (MISCELLANEOUS) IMPLANT
CLOSURE STERI STRIP 1/2 X4 (GAUZE/BANDAGES/DRESSINGS) ×1 IMPLANT
CLOTH BEACON ORANGE TIMEOUT ST (SAFETY) ×2 IMPLANT
DRAPE C SECTION CLR SCREEN (DRAPES) ×2 IMPLANT
DRSG OPSITE POSTOP 4X10 (GAUZE/BANDAGES/DRESSINGS) ×2 IMPLANT
DURAPREP 26ML APPLICATOR (WOUND CARE) ×2 IMPLANT
ELECT REM PT RETURN 9FT ADLT (ELECTROSURGICAL) ×2
ELECTRODE REM PT RTRN 9FT ADLT (ELECTROSURGICAL) ×1 IMPLANT
EXTRACTOR VACUUM M CUP 4 TUBE (SUCTIONS) ×1 IMPLANT
GLOVE BIOGEL PI IND STRL 7.0 (GLOVE) ×2 IMPLANT
GLOVE BIOGEL PI INDICATOR 7.0 (GLOVE) ×2
GLOVE ECLIPSE 6.5 STRL STRAW (GLOVE) ×2 IMPLANT
GOWN STRL REUS W/TWL LRG LVL3 (GOWN DISPOSABLE) ×4 IMPLANT
HEMOSTAT SURGICEL 2X3 (HEMOSTASIS) ×2 IMPLANT
HEMOSTAT SURGICEL 4X8 (HEMOSTASIS) ×1 IMPLANT
KIT ABG SYR 3ML LUER SLIP (SYRINGE) IMPLANT
NDL HYPO 25X5/8 SAFETYGLIDE (NEEDLE) IMPLANT
NDL SAFETY ECLIPSE 18X1.5 (NEEDLE) IMPLANT
NEEDLE HYPO 18GX1.5 SHARP (NEEDLE) ×2
NEEDLE HYPO 22GX1.5 SAFETY (NEEDLE) ×3 IMPLANT
NEEDLE HYPO 25X5/8 SAFETYGLIDE (NEEDLE) IMPLANT
NS IRRIG 1000ML POUR BTL (IV SOLUTION) ×2 IMPLANT
PACK C SECTION WH (CUSTOM PROCEDURE TRAY) ×2 IMPLANT
PAD ABD 7.5X8 STRL (GAUZE/BANDAGES/DRESSINGS) ×2 IMPLANT
PAD OB MATERNITY 4.3X12.25 (PERSONAL CARE ITEMS) ×2 IMPLANT
RTRCTR C-SECT PINK 25CM LRG (MISCELLANEOUS) IMPLANT
SPONGE LAP 18X18 RF (DISPOSABLE) ×2 IMPLANT
STRIP CLOSURE SKIN 1/2X4 (GAUZE/BANDAGES/DRESSINGS) IMPLANT
SUT CHROMIC GUT AB #0 18 (SUTURE) IMPLANT
SUT MNCRL 0 VIOLET CTX 36 (SUTURE) ×3 IMPLANT
SUT MON AB 2-0 SH 27 (SUTURE)
SUT MON AB 2-0 SH27 (SUTURE) IMPLANT
SUT MON AB 3-0 SH 27 (SUTURE) ×2
SUT MON AB 3-0 SH27 (SUTURE) IMPLANT
SUT MON AB 4-0 PS1 27 (SUTURE) IMPLANT
SUT MONOCRYL 0 CTX 36 (SUTURE) ×3
SUT PLAIN 2 0 (SUTURE)
SUT PLAIN 2 0 XLH (SUTURE) IMPLANT
SUT PLAIN ABS 2-0 CT1 27XMFL (SUTURE) IMPLANT
SUT VIC AB 0 CT1 36 (SUTURE) ×4 IMPLANT
SUT VIC AB 2-0 CT1 27 (SUTURE) ×4
SUT VIC AB 2-0 CT1 TAPERPNT 27 (SUTURE) ×1 IMPLANT
SUT VIC AB 4-0 KS 27 (SUTURE) ×1 IMPLANT
SUT VIC AB 4-0 PS2 27 (SUTURE) IMPLANT
SYR 20CC LL (SYRINGE) ×1 IMPLANT
SYR 3ML LL SCALE MARK (SYRINGE) ×1 IMPLANT
SYR CONTROL 10ML LL (SYRINGE) ×2 IMPLANT
TOWEL OR 17X24 6PK STRL BLUE (TOWEL DISPOSABLE) ×2 IMPLANT
TRAY FOLEY W/BAG SLVR 14FR LF (SET/KITS/TRAYS/PACK) IMPLANT

## 2017-08-22 NOTE — Anesthesia Preprocedure Evaluation (Signed)
Anesthesia Evaluation  Patient identified by MRN, date of birth, ID band Patient awake    Reviewed: Allergy & Precautions, NPO status , Patient's Chart, lab work & pertinent test results  Airway Mallampati: II  TM Distance: >3 FB Neck ROM: Full    Dental no notable dental hx.    Pulmonary PE   Pulmonary exam normal breath sounds clear to auscultation       Cardiovascular negative cardio ROS Normal cardiovascular exam Rhythm:Regular Rate:Normal     Neuro/Psych negative neurological ROS  negative psych ROS   GI/Hepatic negative GI ROS, Neg liver ROS,   Endo/Other  negative endocrine ROS  Renal/GU negative Renal ROS  negative genitourinary   Musculoskeletal negative musculoskeletal ROS (+)   Abdominal   Peds negative pediatric ROS (+)  Hematology negative hematology ROS (+)   Anesthesia Other Findings   Reproductive/Obstetrics (+) Pregnancy                             Anesthesia Physical Anesthesia Plan  ASA: II  Anesthesia Plan: Spinal   Post-op Pain Management:    Induction:   PONV Risk Score and Plan: 2 and Ondansetron and Treatment may vary due to age or medical condition  Airway Management Planned: Natural Airway  Additional Equipment:   Intra-op Plan:   Post-operative Plan:   Informed Consent: I have reviewed the patients History and Physical, chart, labs and discussed the procedure including the risks, benefits and alternatives for the proposed anesthesia with the patient or authorized representative who has indicated his/her understanding and acceptance.   Dental advisory given  Plan Discussed with:   Anesthesia Plan Comments: (Last lovenox dose >24hrs ago)        Anesthesia Quick Evaluation

## 2017-08-22 NOTE — Anesthesia Postprocedure Evaluation (Signed)
Anesthesia Post Note  Patient: Jessica Dudley  Procedure(s) Performed: PRIMARY CESAREAN SECTION WITH BILATERAL TUBAL LIGATION (N/A ) MYOMECTOMY (N/A )     Patient location during evaluation: Mother Baby Anesthesia Type: Spinal Level of consciousness: awake and alert Pain management: pain level controlled Vital Signs Assessment: vitals unstable Respiratory status: spontaneous breathing Cardiovascular status: stable Postop Assessment: no headache, patient able to bend at knees, no backache, no apparent nausea or vomiting, adequate PO intake and spinal receding Anesthetic complications: no    Last Vitals:  Vitals:   08/22/17 1822 08/22/17 1829  BP:    Pulse:    Resp:    Temp:    SpO2: 100% 100%    Last Pain:  Vitals:   08/22/17 1818  TempSrc: Oral  PainSc:    Pain Goal:                 [name]

## 2017-08-22 NOTE — Transfer of Care (Signed)
Immediate Anesthesia Transfer of Care Note  Patient: Jessica Dudley  Procedure(s) Performed: PRIMARY CESAREAN SECTION WITH BILATERAL TUBAL LIGATION (N/A ) MYOMECTOMY (N/A )  Patient Location: PACU  Anesthesia Type:Spinal  Level of Consciousness: awake, alert  and patient cooperative  Airway & Oxygen Therapy: Patient Spontanous Breathing  Post-op Assessment: Report given to RN and Post -op Vital signs reviewed and stable  Post vital signs: Reviewed and stable  Last Vitals:  Vitals Value Taken Time  BP    Temp 36.7 C 08/22/2017  9:21 AM  Pulse 79 08/22/2017  9:22 AM  Resp 14 08/22/2017  9:22 AM  SpO2 99 % 08/22/2017  9:22 AM  Vitals shown include unvalidated device data.  Last Pain:  Vitals:   08/22/17 0547  TempSrc: Oral         Complications: No apparent anesthesia complications

## 2017-08-22 NOTE — Anesthesia Postprocedure Evaluation (Signed)
Anesthesia Post Note  Patient: Jessica Dudley  Procedure(s) Performed: PRIMARY CESAREAN SECTION WITH BILATERAL TUBAL LIGATION (N/A ) MYOMECTOMY (N/A )     Patient location during evaluation: PACU Anesthesia Type: Spinal Level of consciousness: awake and alert Pain management: pain level controlled Vital Signs Assessment: post-procedure vital signs reviewed and stable Respiratory status: spontaneous breathing and respiratory function stable Cardiovascular status: blood pressure returned to baseline and stable Postop Assessment: no headache, no backache, spinal receding and no apparent nausea or vomiting Anesthetic complications: no    Last Vitals:  Vitals:   08/22/17 1055 08/22/17 1210  BP: 110/74 97/61  Pulse: 72 81  Resp: 16 18  Temp: 36.6 C 36.7 C  SpO2: 100% 98%    Last Pain:  Vitals:   08/22/17 1210  TempSrc: Axillary  PainSc:    Pain Goal:                 [name]

## 2017-08-22 NOTE — Brief Op Note (Signed)
08/22/2017  9:20 AM  PATIENT:  Jessica Dudley  42 y.o. female  PRE-OPERATIVE DIAGNOSIS:  Previous Myomectomy, Desires sterilization, IVF pregnancy, fibroid uterus, IUP @ 37 weeks  POST-OPERATIVE DIAGNOSIS:  Previous  Myomectomy, desires sterilization Fibroid( IM/SS ) uterus, IUP@ 37 weeks, IVF pregnancy, pelvic adhesion, left paratubal cyst  PROCEDURE:  Primary Classical section, modified pomeroy tubal ligation, myomectomy, lysis of adhesion  SURGEON:  Surgeon(s) and Role:    * Servando Salina, MD - Primary  PHYSICIAN ASSISTANT:   ASSISTANTS: Artelia Laroche, CNM   ANESTHESIA:   spinal Findings: live female, IM/SS fibroids, left paratubal cyst. Serosal uterine adhesion to left lower pelvis, nl ovaries EBL:  850 mL   BLOOD ADMINISTERED:none  DRAINS: none   LOCAL MEDICATIONS USED:  OTHER marcaine with kenolog  SPECIMEN:  Source of Specimen:  portion of right and left tube, myomas x 2, paratubal cyst  DISPOSITION OF SPECIMEN:  PATHOLOGY  COUNTS:  YES  TOURNIQUET:  * No tourniquets in log *  DICTATION: .Other Dictation: Dictation Number [id number]  PLAN OF CARE: Admit to inpatient   PATIENT DISPOSITION:  PACU - hemodynamically stable.   Delay start of Pharmacological VTE agent (>24hrs) due to surgical blood loss or risk of bleeding: no

## 2017-08-22 NOTE — Anesthesia Procedure Notes (Signed)
Spinal  Patient location during procedure: OR Staffing Anesthesiologist: Carignan, Peter, MD Performed: anesthesiologist  Preanesthetic Checklist Completed: patient identified, site marked, surgical consent, pre-op evaluation, timeout performed, IV checked, risks and benefits discussed and monitors and equipment checked Spinal Block Patient position: sitting Prep: DuraPrep Patient monitoring: heart rate, continuous pulse ox and blood pressure Approach: right paramedian Location: L3-4 Injection technique: single-shot Needle Needle type: Sprotte  Needle gauge: 24 G Needle length: 9 cm Additional Notes Expiration date of kit checked and confirmed. Patient tolerated procedure well, without complications.       

## 2017-08-22 NOTE — Op Note (Signed)
NAME:  Jessica Dudley, Jessica Dudley           ACCOUNT NO.:  [id number]  MEDICAL RECORD NO.:  [id number]  LOCATION:  0932                          FACILITY:  [facility]  PHYSICIAN:  Servando Salina, M.D.DATE OF BIRTH:  01-27-1976  DATE OF PROCEDURE:  08/22/2017 DATE OF DISCHARGE:                              OPERATIVE REPORT   PREOPERATIVE DIAGNOSES:  Previous myomectomy, intrauterine gestation at 54 weeks, history of pulmonary embolism, fibroid uterus, desires permanent sterilization.  PROCEDURES:  Primary classical cesarean section, modified Pomeroy tubal ligation, lysis of adhesions, myomectomy.  POSTOPERATIVE DIAGNOSES:  Intramural subserosal fibroids, previous myomectomy, desires sterilization, pelvic adhesions, intrauterine gestation at 37 weeks, history of pulmonary embolism.  ANESTHESIA:  Spinal.  SURGEON:  Servando Salina, M.D.  ASSISTANT:  Artelia Laroche, CNM  DESCRIPTION OF PROCEDURE:  Under adequate spinal anesthesia, the patient was placed in the supine position with a left lateral tilt.  She was sterilely prepped and draped in usual fashion.  An indwelling Foley catheter was sterilely placed.  The patient had a previous small Pfannenstiel skin incision, which had keloid formation.  Marcaine 0.25% was injected along the planned Pfannenstiel skin incision, which was just below the prior scar and the incision was extended down to the rectus fascia.  Rectus fascia was opened transversely.  Rectus fascia was then bluntly and sharply dissected off the rectus muscle in a superior and inferior fashion.  The rectus muscles split in the midline. The parietal peritoneum was opened sharply and extended.  In the process of doing so, there was a mid-uterine serosal adhesion, extending down to the parietal peritoneum and rectus fascial area.  Using the vesicouterine peritoneum reflection which was then opened transversely, the thick adhesion was then subsequently and systematically lysed,  which then released the midbody of the uterus off the lower uterine area.  A self- retaining Alexis retractor was then placed.  The bladder peritoneum was opened transversely and displaced inferiorly with dissection and bladder retractor.  The lower uterine segment was not wide enough to accommodate a lower uterine segment incision.  Multiple palpable intramural and subserosal fibroids could be felt.  Incision was therefore made in the lower portion of the uterus in a vertical fashion and extended into the uterine muscle.  Artificial rupture of membranes of  clear amniotic fluid was noted.  Floating vertex was encountered, resulted in the need to use a vacuum for assistance in delivery of a live female, who was bulb suctioned on the abdomen. Delayed cord clamped for a minute.  Cord was subsequently clamped, cut, and the baby was transferred to the awaiting pediatrician, who assigned Apgars of 8 and 9 at one and five minutes.  Placenta was manually removed.  Uterine cavity was cleaned of debris.  The incision had 2 fibroids, which were needed to be removed in order to facilitate closure.  These were removed with blunt and sharp dissection and cautery.  At that point, incision was then closed with 0 Monocryl running lock stitch interrupted sutures until hemostasis was achieved. Once hemostasis was achieved, additional figure-of-eight sutures and 3-0 Monocryl sutures were placed for hemostasis.  Some cauterization of surface bleeding was also done.  Once this was achieved, attention was then turned to the  fallopian tubes, both of which were identified out to the fimbriated end.  The midportion of both tubes was grasped with a Babcock.  The underlying mesosalpinx was opened with cautery.  Proximal and distal portion of the tubes on both sides were tied with 0 chromic sutures and the intervening segments were then removed and sent separately.  There was a paratubal cyst noted on the left, which was  removed.  The abdomen was irrigated and suctioned.  The Surgicel was placed overlying the raw area of the incision site.  The thickened defect in the uterus from the adhesion was closed with 3-0 Monocryl suture.  The Alexis retractor was removed.  Interceed was placed overlying the Surgicel and the parietal peritoneum was then closed with 2-0 Vicryl suture.  The rectus fascia was closed with 0 Vicryl x2.  The subcutaneous area was irrigated, small bleeders cauterized.  The incision was then injected with a solution of Marcaine with Kenalog due to the prior keloid formation.  The keloid incision was then removed and the skin was approximated with 4-0 Vicryl subcuticular closure.  Benzoin and Steri-Strips were placed.  SPECIMENS:  Portion of right and left fallopian tubes, myomas, paratubal cysts, all sent to Pathology.  The placenta was not sent.  ESTIMATED BLOOD LOSS:  850 cc.  INTRAOPERATIVE FLUIDS:  2700 cc.  URINE OUTPUT:  150 cc.  Sponge and instrument counts x2 were correct.  COMPLICATIONS:  None.  The patient tolerated the procedure well, was transferred to recovery room in stable condition.     Servando Salina, M.D.     Doyle/MEDQ  D:  08/22/2017  T:  08/22/2017  Job:  [id number]

## 2017-08-22 NOTE — Progress Notes (Signed)
Assessment preformed by Jettie Booze, RN charted by Jolaine Click, RN due to family emergency.

## 2017-08-23 LAB — CBC
HCT: 23.9 % — ABNORMAL LOW (ref 36.0–46.0)
Hemoglobin: 7.9 g/dL — ABNORMAL LOW (ref 12.0–15.0)
MCH: 23.1 pg — ABNORMAL LOW (ref 26.0–34.0)
MCHC: 33.1 g/dL (ref 30.0–36.0)
MCV: 69.9 fL — ABNORMAL LOW (ref 78.0–100.0)
Platelets: 167 10*3/uL (ref 150–400)
RBC: 3.42 MIL/uL — ABNORMAL LOW (ref 3.87–5.11)
RDW: 15.4 % (ref 11.5–15.5)
WBC: 16.8 10*3/uL — ABNORMAL HIGH (ref 4.0–10.5)

## 2017-08-23 LAB — BIRTH TISSUE RECOVERY COLLECTION (PLACENTA DONATION)

## 2017-08-23 MED ORDER — POLYSACCHARIDE IRON COMPLEX 150 MG PO CAPS
150.0000 mg | ORAL_CAPSULE | Freq: Every day | ORAL | Status: DC
  Administered 2017-08-24 – 2017-08-26 (×2): 150 mg via ORAL
  Filled 2017-08-23 (×3): qty 1

## 2017-08-23 MED ORDER — HYDROMORPHONE HCL 2 MG PO TABS
4.0000 mg | ORAL_TABLET | ORAL | Status: DC | PRN
  Administered 2017-08-23 – 2017-08-25 (×4): 4 mg via ORAL
  Filled 2017-08-23 (×4): qty 2

## 2017-08-23 MED ORDER — RHO D IMMUNE GLOBULIN 1500 UNIT/2ML IJ SOSY
300.0000 ug | PREFILLED_SYRINGE | Freq: Once | INTRAMUSCULAR | Status: AC
  Administered 2017-08-23: 300 ug via INTRAVENOUS
  Filled 2017-08-23: qty 2

## 2017-08-23 MED ORDER — MAGNESIUM OXIDE 400 (241.3 MG) MG PO TABS
400.0000 mg | ORAL_TABLET | Freq: Every day | ORAL | Status: DC
  Administered 2017-08-23 – 2017-08-26 (×4): 400 mg via ORAL
  Filled 2017-08-23 (×4): qty 1

## 2017-08-23 MED ORDER — ENOXAPARIN SODIUM 100 MG/ML ~~LOC~~ SOLN
100.0000 mg | SUBCUTANEOUS | Status: DC
  Administered 2017-08-23 – 2017-08-26 (×4): 100 mg via SUBCUTANEOUS
  Filled 2017-08-23 (×4): qty 1

## 2017-08-23 NOTE — Progress Notes (Signed)
POSTOPERATIVE DAY # 1 S/P CS - previous myomectomy / BTL  S:         Reports feeling lots of pain this am - soreness and cramps             Tolerating po intake / no nausea / no vomiting / no flatus / no BM             Bleeding is light             Pain controlled with long-acting narcotic during night - concerns for narcotic meds due to previous N&V symptoms             Not out of bed yet  Newborn Breast / female  O:  Dr Garwin Brothers in this am - discussion pain management and ambulation plan VS: BP 100/60   Pulse 88   Temp 98.4 F (36.9 C)   Resp 18   SpO2 100%   Breastfeeding? Unknown   LABS:              Recent Labs    08/21/17 1105 08/23/17 0542  WBC 10.9* 16.8*  HGB 10.1* 7.9*  PLT 229 167               Bloodtype: --/--/A NEG (04/17 1105) / newborn RH positive - rhogam ordered  Rubella: Immune (10/02 0000)                                         I&O: Intake/Output      04/18 0701 - 04/19 0700 04/19 0701 - 04/20 0700   P.O. 350    I.V. 4405.4    Total Intake 4755.4    Urine 1850    Emesis/NG output 500    Blood 850    Total Output 3200    Net +1555.4                    Physical Exam:             Alert and Oriented X3  Lungs: Clear and unlabored  Heart: regular rate and rhythm / no mumurs  Abdomen: soft, non-tender, mildly distended, active BS             Fundus: firm, non-tender, Ueven             Dressing pressure dressing intact           Lochia: light  Extremities: trace pedal edema, no calf pain or tenderness, SCD in place  A:        POD # 1 S/P CS / myomectomy / BTL            HX PE - Lovenox restart last night / SCD in place / need to ambulate            IDA with ABL anemia  P:        Routine postoperative care              start magnesium today - iron after increased bowel motility             analgesia per Dr Garwin Brothers             Monitor I&O next 24 hour / edema   Jessica Dudley CNM, MSN, [facility] 08/23/2017, 8:05 AM

## 2017-08-23 NOTE — Lactation Note (Signed)
This note was copied from a baby's chart. Lactation Consultation Note  Patient Name: Girl Yuka Lallier [id number] Date: 08/23/2017 Reason for consult: Initial assessment;Maternal endocrine disorder;Early term 37-38.6wks Type of Endocrine Disorder?: PCOS  Visited with P2 Mom at 25 hrs post C-Section.  Baby [redacted]w[redacted]d weeks.   Mom resting with baby STS.  Mom has breastfed baby 8 times in first day.  Some feedings 5-7 minutes.  Mom states baby is latching deeply, and hearing swallows.  Reviewed hand expression, and colostrum easily expressed. Mom getting up to BR with NT.  Encouraged Mom to call prn for assistance.  Recommended STS and cue based feedings.  Goal of 8-12 feedings per 24 hrs.    Lactation brochure left with Mom .  Mom informed of IP and OP lactation services.  Consult Status Consult Status: Follow-up Date: 08/24/17 Follow-up type: [place] 08/23/2017, 10:46 AM

## 2017-08-24 LAB — RH IG WORKUP (INCLUDES ABO/RH)
ABO/RH(D): A NEG
Fetal Screen: NEGATIVE
Gestational Age(Wks): 37.5
Unit division: 0

## 2017-08-24 MED ORDER — HYDROCHLOROTHIAZIDE 12.5 MG PO CAPS
12.5000 mg | ORAL_CAPSULE | Freq: Every day | ORAL | Status: DC
  Administered 2017-08-24: 12.5 mg via ORAL
  Filled 2017-08-24 (×2): qty 1

## 2017-08-24 NOTE — Lactation Note (Signed)
This note was copied from a baby's chart. Lactation Consultation Note  Patient Name: Jessica Dudley [id number] Date: 08/24/2017 Reason for consult: Follow-up assessment;Maternal endocrine disorder;Early term 37-38.6wks Type of Endocrine Disorder?: PCOS  12 hours old early term female who is now being BF and formula fed by her mother, mom got really sore yesterday and had a hard time latching baby on. Baby has already been started with Similac 22 calorie formula supplementation due to 10% weight loss. When LC came into the room baby was asleep; offered assistance with latch, but per mom baby had already fed 1 oz. Of formula but she said she has been latching baby on today, but her nipples are sore. Asked her RN for some coconut oil and reviewed treatment for sore nipples with mom.  Discussed supplementation with EBM and formula, mom will follow supplementation guidelines. She'll also try to pump in the lowest setting (even pumping is uncomfortable) and use coconut oil prior pump to help with lubrication. Asked mom to call for latch assistance when needed. She is aware of [place] services and will call PRN.  Maternal Data    Feeding Feeding Type: Formula Length of feed: 10 min(off and on)  Interventions Interventions: Breast feeding basics reviewed  Lactation Tools Discussed/Used     Consult Status Consult Status: Follow-up Date: 08/25/17 Follow-up type: In-patient    Milagros Francene Boyers 08/24/2017, 9:31 PM

## 2017-08-24 NOTE — Progress Notes (Signed)
POSTOPERATIVE DAY # 2 S/P CS   S:         Reports feeling sore and tired              Tolerating po intake / no nausea / no vomiting / + flatus / no BM             Bleeding is light             Pain controlled with dilaudid             Up ad lib to bathroom / not ambulatory/ voiding QS  Newborn Breast    O:  VS: BP 104/64   Pulse 70   Temp 98.3 F (36.8 C) (Oral)   Resp 18   SpO2 100%   Breastfeeding? Unknown                     I&O: net positive 1550             Physical Exam:             Alert and Oriented X3  Lungs: Clear and unlabored  Heart: regular rate and rhythm / no mumurs  Abdomen: soft, non-tender, non-distended             Fundus: firm, non-tender, Ueven             Dressing intact              Incision:  no erythema / no ecchymosis / no drainage  Perineum: intact  Lochia: light to moderate  Extremities: 2+edema, no calf pain or tenderness, SCD in place (Lovenox)  A/P     POD # 2 S/P CS            Hx PE on Lovenox prophylaxis            Dilaudid for pain - stop NDSAIDs with blood thinner            Ambulate in halls today            HCTZ for edema      Artelia Laroche CNM, MSN, [facility] 08/24/2017, 10:56 AM

## 2017-08-25 ENCOUNTER — Other Ambulatory Visit: Payer: Self-pay

## 2017-08-25 ENCOUNTER — Inpatient Hospital Stay (HOSPITAL_COMMUNITY): Payer: BLUE CROSS/BLUE SHIELD

## 2017-08-25 LAB — TYPE AND SCREEN
ABO/RH(D): A NEG
Antibody Screen: POSITIVE
Unit division: 0
Unit division: 0

## 2017-08-25 LAB — BPAM RBC
Blood Product Expiration Date: 201904282359
Blood Product Expiration Date: 201904282359
Unit Type and Rh: 600
Unit Type and Rh: 600

## 2017-08-25 MED ORDER — BISACODYL 10 MG RE SUPP
10.0000 mg | Freq: Two times a day (BID) | RECTAL | Status: DC
  Administered 2017-08-25: 10 mg via RECTAL
  Filled 2017-08-25 (×2): qty 1

## 2017-08-25 MED ORDER — SENNOSIDES-DOCUSATE SODIUM 8.6-50 MG PO TABS
1.0000 | ORAL_TABLET | Freq: Two times a day (BID) | ORAL | Status: DC
  Administered 2017-08-25 – 2017-08-26 (×3): 1 via ORAL
  Filled 2017-08-25 (×3): qty 1

## 2017-08-25 MED ORDER — HYDROMORPHONE HCL 2 MG PO TABS
2.0000 mg | ORAL_TABLET | ORAL | Status: DC | PRN
  Administered 2017-08-25 (×2): 2 mg via ORAL
  Filled 2017-08-25 (×2): qty 1

## 2017-08-25 MED ORDER — FLEET ENEMA 7-19 GM/118ML RE ENEM
1.0000 | ENEMA | Freq: Once | RECTAL | Status: AC
  Administered 2017-08-25: 1 via RECTAL

## 2017-08-25 MED ORDER — HYDROMORPHONE HCL 2 MG PO TABS: 2.0000 mg | ORAL_TABLET | ORAL | Status: DC | PRN

## 2017-08-25 NOTE — Progress Notes (Signed)
Mom instructed/encouraged by day nurse and this nurse to pump at 2-3 hours intervals to help treat prophylcatically pending breast engorgement.  Instructions were not heeded and mom's breasts very painful.Marland KitchenHas been pumping regularly since.  Mom also has swollen areas on outsides of both breasts due to engorgement.  Showed how to massage areas but mom said too painful at this time.  Placed ice packs on either side of breasts to try to help reduce swelling so draining massage can be done..  Using perineal packs now because of breast sensitivity; will transition to regular icepacks when able.

## 2017-08-25 NOTE — Progress Notes (Signed)
POSTOPERATIVE DAY # 3 S/P CS   S:         Reports feeling intense gas pains over night             Tolerating po intake / no nausea / no vomiting / passing a lot of gas yesterdray but none overnight flatus                                burping since prune juice - gas pills have helped pain             Bleeding is light             Pain controlled withdilaudid             Up ad lib - mostly in bed / ambulatory some in room - not in halls/ voiding QS  Newborn Breast   O:  VS: BP 112/76   Pulse 76   Temp 98.5 F (36.9 C) (Oral)   Resp 18   SpO2 100%   Breastfeeding? Unknown     Physical Exam:             Alert and Oriented X3  Lungs: Clear and unlabored  Heart: regular rate and rhythm / no mumurs  Abdomen: soft, non-tender, some distention this AM - not taut / no BS             Fundus: firm, non-tender, Ueven             Dressing intact              Incision:  approximated with suture / no erythema / no ecchymosis / no drainage  Perineum: intact  Lochia: light to scant  Extremities: no edema, no calf pain or tenderness, negative Homans  A:        POD # 3 S/P CS            constipation with concern for ileus            HX PE on Lovenox prophylaxis  P:        Routine postoperative care              Dulcolax suppository  - abdominal imaging for possible ileus             Dr Garwin Brothers consulted - will follow after imaging  Artelia Laroche CNM, MSN, [facility] 08/25/2017, 7:38 AM    Addendum Abdominal scan reviewed results @ 2025:  CLINICAL DATA:  Acute onset abdominal pain and bloating. No bowel sounds on physical exam today. Patient status post cesarean section, lysis of adhesions and tubal ligation 08/22/2017.  EXAM: ABDOMEN - 1 VIEW  COMPARISON:  None.  FINDINGS: A very large volume of stool is seen in the ascending and proximal transverse colon. Gaseous distention of the colon is identified. A few small locules of air are seen in the rectosigmoid colon. Bowel gas  pattern is not suggestive of small bowel obstruction. A few small locules of air in the left upper quadrant of the abdomen are presumably within the stomach.  IMPRESSION: Massive volume of stool in the ascending and proximal transverse colon.  Gaseous distention of the colon is suggestive of ileus. The bowel gas pattern is not typical of small-bowel obstruction.  Linear lucencies in the upper abdomen are likely due to free intraperitoneal air in this patient who is status post cesarean section 3 days ago.

## 2017-08-25 NOTE — Lactation Note (Signed)
This note was copied from a baby's chart. Lactation Consultation Note Baby 27 hrs old. 37 5/[redacted] weeks gestation.   Mom has 41 yr old that she BF for a short time, then pumped and bottle fed 7 months. Mom supplemented. Mom is going to pump but would like to BF as well. Mom has inverted nipples that everts w/stimulation.  Mom has DEBP, not collecting any colostrum when pumped. Applying coconut oil for soreness. Skin intact. Pumping for stimulation. Mom is supplementing baby w/formula.  Shells given to evert nipple . Hand pump given to pre-pump before latching. Mom stated she never used a NS and would like to try one. Fitted w/#24 NS. Encouraged to wear shells.  Mom demonstrated several times application of NS. Demonstrated "C" hold, mom application of NS several times. Discussed w/mom calling for appt. W/OP LC if decides to use NS. Encouraged to call for latch assist before d/c home. Reviewed milk supply, milk storage, transfer after feeding, I&O, STS, supply and demand.  Patient Name: Jessica Dudley [id number] Date: 08/25/2017 Reason for consult: Follow-up assessment   Maternal Data    Feeding Feeding Type: Bottle Fed - Formula  LATCH Score       Type of Nipple: Inverted  Comfort (Breast/Nipple): Filling, red/small blisters or bruises, mild/mod discomfort        Interventions Interventions: Support pillows;Breast feeding basics reviewed;Position options;Skin to skin;Breast massage;Coconut oil;Hand express;Shells;Pre-pump if needed;Hand pump;Breast compression;DEBP  Lactation Tools Discussed/Used Tools: Shells;Pump;Coconut oil;Nipple Shields Nipple shield size: 24 Shell Type: Inverted Breast pump type: Double-Electric Breast Pump;Manual   Consult Status Consult Status: Complete Date: 08/25/17    Theodoro Kalata 08/25/2017, 6:31 AM

## 2017-08-25 NOTE — Plan of Care (Signed)
  Problem: Clinical Measurements: Goal: Ability to maintain clinical measurements within normal limits will improve Outcome: Progressing Goal: Will remain free from infection Outcome: Progressing   Problem: Elimination: Goal: Will not experience complications related to bowel motility Outcome: Progressing Note:  X-ray completed today d/t abdm pain & distention; negative for ileus, pt noted to have large amt stool in acsending & transverse colon.  Pt has ambulated multiple times in hallway, had warm fluids, & heating pad at bedside.  Given fleets enema & will assess for results.  Plan to give dulcolax suppository soon. Goal: Will not experience complications related to urinary retention Outcome: Progressing Note:  Pt has voided multiple  times during this shift & reports normal amts.   Problem: Skin Integrity: Goal: Risk for impaired skin integrity will decrease Outcome: Progressing   Problem: Life Cycle: Goal: Chance of risk for complications during the postpartum period will decrease Outcome: Progressing Note:  Fundas firm, lochia small amt without clots.   Problem: Skin Integrity: Goal: Demonstration of wound healing without infection will improve Outcome: Progressing Note:  Honeycomb dressing without drainage at this time.  Site free from redness & tenderness.

## 2017-08-26 MED ORDER — IRON (FERROUS GLUCONATE) 256 (28 FE) MG PO TABS
256.0000 mg | ORAL_TABLET | Freq: Every day | ORAL | 3 refills | Status: DC
Start: 2017-08-26 — End: 2018-12-27

## 2017-08-26 MED ORDER — HYDROMORPHONE HCL 2 MG PO TABS: 2.0000 mg | ORAL_TABLET | ORAL | 0 refills | Status: DC | PRN

## 2017-08-26 NOTE — Lactation Note (Signed)
This note was copied from a baby's chart. Lactation Consultation Note  Patient Name: [name] YIRSW'N Date: 08/26/2017   P51, 28 68 days old.  Mother has cracks and abrasions on nipples so she is pumping. She states trauma started with an initial shallow latch. She states she plans to pump until her nipples heal. Mother's breasts are filling and she was originally not emptying her breasts on a regular basis.  She now knows to pump at least q 3 hours. Assisted w/ massaging breasts and axilla area.   She is using coconut oil.  Encouraged using ebm and comfort gels.   Mother is trying #27 flanges to see which size is more comfortable. Discussed engorgement care. Mother has personal DEBP at home.     Maternal Data    Feeding Feeding Type: Breast Milk with Formula added  LATCH Score                   Interventions    Lactation Tools Discussed/Used     Consult Status      Carlye Grippe 08/26/2017, 9:36 AM

## 2017-08-26 NOTE — Discharge Summary (Addendum)
Obstetric Discharge Summary   Patient Name: Jessica Dudley DOB: 30-Oct-1975 MRN: [id number]  Date of Admission: 08/22/2017 Date of Discharge: 08/26/2017 Date of Delivery: 4/18 Gestational Age at Delivery: 37wk  Primary OB: [facility] - Dr. Garwin Brothers  Antepartum complications:  - Hx. Of Myomectomy - Hx. Of PE - on Lovenox 100mg , followed hematology  - AMA - HSV-2 - Maternal IDA  - Infertility: IVF pregnancy  - RH Negative  - Low-lying placenta: resolved - Bilateral CPC - Marginal PCI  - GBS Positive   Prenatal Labs:  ABO/RH: A Negative Antibody: Negative  Rubella: Immune RPR: NR HIV: NR Hepatitis B negative GBS Positive  1hr GTT:  GC/chlamydia negative  Admitting Diagnosis:  prior myomectomy, desires sterilization, hx PE, uterine fibroids affecting pregnancy  Secondary Diagnoses: Patient Active Problem List   Diagnosis Date Noted  . Cesarean delivery delivered 4/18 08/22/2017  . Postpartum care following cesarean delivery (4/18) 08/22/2017  . Pregnancy, high-risk 07/23/2017  . Hx of pulmonary embolus 07/23/2017  . In vitro fertilization 07/02/2016  . PE (pulmonary thromboembolism) ([place]) 12/11/2015  . Leiomyoma 11/29/2015  . Anemia 06/13/2011  . GENITAL HERPES, HX OF 11/03/2009    Date of Delivery: 08/22/17 Delivered By: Dr. Garwin Brothers T. Mel Almond, CNM  Delivery Type: primary cesarean section, low transverse incision   Newborn Data: Live born female  Birth Weight: 6 lb 8.8 oz (2970 g) APGAR: 66, 9  Newborn Delivery   Birth date/time:  08/22/2017 08:00:00 Delivery type:  C-Section, Vacuum Assisted Trial of labor:  No C-section categorization:  Primary     Hospital/Postpartum Course  (Cesarean Section):  Pt. Admitted for planned cesarean section due to prior myomectomy and desired sterilization.  See OP note for further details. PNC complicated by hx PE for which pt has been on lovenox. Lovenox was resumed 24 hr postop.  Patient's postpartum course  was complicated by constipation with questionable ileus( not seen on Xray) on day 3, but resolved with Dulcolax suppository, ambulation, and warm fluids.  By time of discharge on POD#4, her pain was controlled on oral pain medications; she had appropriate lochia and was ambulating, voiding without difficulty, tolerating regular diet and passing flatus.   She was deemed stable for discharge to home.     Labs: CBC Latest Ref Rng & Units 08/23/2017 08/21/2017 07/16/2017  WBC 4.0 - 10.5 K/uL 16.8(H) 10.9(H) 7.8  Hemoglobin 12.0 - 15.0 g/dL 7.9(L) 10.1(L) 9.8(L)  Hematocrit 36.0 - 46.0 % 23.9(L) 30.8(L) 31.7(L)  Platelets 150 - 400 K/uL 167 229 231   A NEG  Physical exam:  BP 108/65   Pulse 78   Temp 98.7 F (37.1 C) (Oral)   Resp 18   Ht 5\' 1"  (1.549 m)   Wt 78 kg (172 lb)   SpO2 96%   Breastfeeding? Unknown   BMI 32.50 kg/m  General: alert and no distress Pulm: normal respiratory effort Lochia: appropriate Abdomen: soft, NT Uterine Fundus: firm, below umbilicus Perineum: healing well, no significant erythema, no significant edema Incision: c/d/i, healing well, no significant drainage, no dehiscence, no significant erythema Extremities: No evidence of DVT seen on physical exam. No lower extremity edema.   Disposition: stable, discharge to home Baby Feeding: breast milk and formula Baby Disposition: home with mom  Contraception: BTL  Rh Immune globulin given: 08/23/17 Rubella vaccine given: N/A Tdap vaccine given in AP or PP setting: UTD Flu vaccine given in AP or PP setting: UTD   Plan:  Jessica Dudley was discharged to  home in good condition. Follow-up appointment at [facility] in 6 weeks.  Discharge Instructions: Per After Visit Summary. Refer to After Visit Summary and [facility] discharge booklet  Activity: Advance as tolerated. Pelvic rest for 6 weeks.   Diet: Regular, Heart Healthy Discharge Medications: Allergies as of 08/26/2017      Reactions    Norco [hydrocodone-acetaminophen] Nausea And Vomiting   Oxycodone-acetaminophen Nausea And Vomiting      Medication List    TAKE these medications   enoxaparin 150 MG/ML injection Commonly known as:  LOVENOX Inject 0.67 mLs (100 mg total) into the skin daily.   HYDROmorphone 2 MG tablet Commonly known as:  DILAUDID Take 1 tablet (2 mg total) by mouth every 4 (four) hours as needed for severe pain.   Iron (Ferrous Gluconate) 256 (28 Fe) MG Tabs Take 256 mg by mouth daily.   MAG-OX 400 PO Take 400 mg by mouth daily.   prenatal multivitamin Tabs tablet Take 1 tablet by mouth daily at 12 noon.   valACYclovir 500 MG tablet Commonly known as:  VALTREX Take 1 tablet (500 mg total) by mouth 2 (two) times daily. For 3-5 days for outbreak   vitamin C 500 MG tablet Commonly known as:  ASCORBIC ACID Take 500 mg by mouth daily.      Outpatient follow up:  Follow-up Information    Servando Salina, MD. Schedule an appointment as soon as possible for a visit in 6 week(s).   Specialty:  Obstetrics and Gynecology Why:  Postpartum visit Contact information: [address] [place] [address] [phone]           Signed:  Lars Pinks, MSN, CNM [facility]

## 2017-08-26 NOTE — Progress Notes (Addendum)
POSTOPERATIVE DAY # 4 S/P Primary LTCS and BTL for previous myomectomy, baby girl    S:         Reports feeling much better - reports having a large bowel movement yesterday with relief; has been passing flatus frequently.  States she has been ambulating more, and gas pain has resolved.  States she has not required Dilaudid pain medication since yesterday.  States pain is manageable.  Pt. Reports she is ready to go home today.              Tolerating po intake / no nausea / no vomiting / + flatus / + BM  Denies dizziness, SOB, or CP             Bleeding is light             Up ad lib / ambulatory/ voiding QS  Newborn breast feeding with formula supplementation - reports some breast tenderness and nipple soreness with cracking.  Reports swollen redundant breast tissue in axillary region - using ice packs PRN with some relief.    O:  VS: BP 108/65   Pulse 78   Temp 98.7 F (37.1 C) (Oral)   Resp 18   Ht 5\' 1"  (1.549 m)   Wt 78 kg (172 lb)   SpO2 96%   Breastfeeding? Unknown   BMI 32.50 kg/m    LABS:              No results for input(s): WBC, HGB, PLT in the last 72 hours.             Bloodtype: --/--/A NEG (04/19 0542)  Rubella: Immune (10/02 0000)                                             I&O: Intake/Output    None                Physical Exam:             Alert and Oriented X3  Breast: nipples cracked bilaterally with scabbing; redundant axillary breast tissue bilaterally - tender on palpation, no erythema, engorgement noted  Lungs: Clear and unlabored  Heart: regular rate and rhythm / no murmurs  Abdomen: soft, non-tender, mild distention, but not taut, active bowel sounds in upper quadrants and hypoactive bowel sounds in lower quadrants - but present in all quadrants             Fundus: firm, non-tender, U-3             Dressing: honeycomb dsg with steri-strips c/d/i              Incision:  approximated with sutures / no erythema / no ecchymosis / no drainage  Perineum:  intact  Lochia: small, no clots   Extremities: trace pedal edema, no calf pain or tenderness, no cords, no evidence of DVT  A:        POD # 4 S/P Primary LTCS and BTL            RH Negative - s/p Rhogam on 4/19  Hx. Of PE - On Lovenox 100mg  daily   Constipation with possible postop ileus - resolved  P:        Routine postoperative care              [facility] home today;  education done  [facility] discharge book reviewed, given, and warning s/s reviewed  Pt. To take her own Magnesium and OTC stool softeners   Requests Ferralet rx   Has f/u with Dr. Beryle Beams, hematology, in 1 month - reports having enough Lovenox   Advised pt. Can take OTC Tylenol PRN for pain - not to exceed 4000mg /day  Okay to continue ice packs to axilla - recommend taking Tylenol 30 min prior to massage - discussed massage from axilla towards breast in the warm shower   F/u with Dr. Garwin Brothers in 6 weeks    Lars Pinks, MSN, [facility]

## 2017-09-23 ENCOUNTER — Other Ambulatory Visit (INDEPENDENT_AMBULATORY_CARE_PROVIDER_SITE_OTHER): Payer: BLUE CROSS/BLUE SHIELD

## 2017-09-23 DIAGNOSIS — O0990 Supervision of high risk pregnancy, unspecified, unspecified trimester: Secondary | ICD-10-CM | POA: Diagnosis not present

## 2017-09-23 DIAGNOSIS — Z86711 Personal history of pulmonary embolism: Secondary | ICD-10-CM

## 2017-09-23 DIAGNOSIS — I2699 Other pulmonary embolism without acute cor pulmonale: Secondary | ICD-10-CM

## 2017-09-24 LAB — CBC WITH DIFFERENTIAL/PLATELET
Basophils Absolute: 0 10*3/uL (ref 0.0–0.2)
Basos: 0 %
EOS (ABSOLUTE): 0 10*3/uL (ref 0.0–0.4)
Eos: 1 %
Hematocrit: 34.9 % (ref 34.0–46.6)
Hemoglobin: 10.7 g/dL — ABNORMAL LOW (ref 11.1–15.9)
Immature Grans (Abs): 0 10*3/uL (ref 0.0–0.1)
Immature Granulocytes: 1 %
Lymphocytes Absolute: 1.4 10*3/uL (ref 0.7–3.1)
Lymphs: 34 %
MCH: 21.5 pg — ABNORMAL LOW (ref 26.6–33.0)
MCHC: 30.7 g/dL — ABNORMAL LOW (ref 31.5–35.7)
MCV: 70 fL — ABNORMAL LOW (ref 79–97)
Monocytes Absolute: 0.4 10*3/uL (ref 0.1–0.9)
Monocytes: 9 %
Neutrophils Absolute: 2.3 10*3/uL (ref 1.4–7.0)
Neutrophils: 55 %
Platelets: 241 10*3/uL (ref 150–450)
RBC: 4.97 x10E6/uL (ref 3.77–5.28)
RDW: 16.1 % — ABNORMAL HIGH (ref 12.3–15.4)
WBC: 4.2 10*3/uL (ref 3.4–10.8)

## 2017-10-01 ENCOUNTER — Ambulatory Visit (INDEPENDENT_AMBULATORY_CARE_PROVIDER_SITE_OTHER): Payer: BLUE CROSS/BLUE SHIELD | Admitting: Oncology

## 2017-10-01 ENCOUNTER — Encounter: Payer: Self-pay | Admitting: Oncology

## 2017-10-01 ENCOUNTER — Other Ambulatory Visit: Payer: Self-pay

## 2017-10-01 VITALS — BP 119/74 | HR 76 | Temp 98.8°F | Ht 62.0 in | Wt 145.3 lb

## 2017-10-01 DIAGNOSIS — Z888 Allergy status to other drugs, medicaments and biological substances status: Secondary | ICD-10-CM | POA: Diagnosis not present

## 2017-10-01 DIAGNOSIS — Z86711 Personal history of pulmonary embolism: Secondary | ICD-10-CM

## 2017-10-01 DIAGNOSIS — O0990 Supervision of high risk pregnancy, unspecified, unspecified trimester: Secondary | ICD-10-CM

## 2017-10-01 DIAGNOSIS — Z7901 Long term (current) use of anticoagulants: Secondary | ICD-10-CM | POA: Diagnosis not present

## 2017-10-01 NOTE — Progress Notes (Signed)
Hematology and Oncology Follow Up Visit  Jessica Dudley [id number] [birth year] 42 y.o. 10/01/2017 1:56 PM   Principle Diagnosis: Encounter Diagnoses  Name Primary?  Marland Kitchen Hx of pulmonary embolus Yes  . High risk pregnancy, antepartum   Clinical summary:  42 year old woman I saw for the first time in December 2017. She is divorced and remarried. She had a child with her first husband. She has been trying to get pregnant again with her new husband but having difficulty. She developed low volume, subsegmental vessel, right lower lobe pulmonary emboli 11 days after anabdominal myomectomy to resect uterine fibroids. She was anticoagulated with Xarelto for 3 months. Please see [email or web address] consultation for complete details. At time of that visit I did screen her for some of the major congenital and acquired coagulopathies and she tested negative for factor V Leiden and prothrombin gene mutations, negative for anticardiolipin antibodies, negative antibodies against beta-2 glycoprotein 1, negative lupus type anticoagulant. Normal Antithrombin level. Sheinitiatedan in vitro fertilization process.Due to the requirements of hormone injections, I did advise prophylactic dose Lovenox. The in vitro fertilization procedure was successful.  I advised her to stay on prophylactic dose Lovenox until the third trimester I then increased her up to therapeutic dose to cover the period of maximum risk of thrombosis.  Current dose 100 mg subcu daily.  She is taking a vitamin D supplement.  She is drinking soy milk which she says is high in calcium.  She had  a planned C-section on April 18 along with a myomectomy and bilateral tubal ligation.  Estimated blood loss 850 mL's.  Hemoglobin 7.9 on April 19.  Lovenox held 24 hours preop and resumed 24 hours postop.  CBC done in anticipation of today's visit on May 20 showed hemoglobin 10.7.    Interim History: She is now 5-1/2 weeks post C-section.  She had a  healthy female child.  Mother and baby doing well.  No postop issues.  She is breast-feeding.  She denies any dyspnea.  She will complete 6 planned weeks of post partum anticoagulation this Thursday, May 30.  Stable 5-1/2 weeks post C-section  Medications: reviewed  Allergies:  Allergies  Allergen Reactions  . Norco [Hydrocodone-Acetaminophen] Nausea And Vomiting  . Oxycodone-Acetaminophen Nausea And Vomiting    Review of Systems: See interim history Remaining ROS negative:   Physical Exam: unknown if currently breastfeeding. Wt Readings from Last 3 Encounters:  08/25/17 172 lb (78 kg)  08/13/17 174 lb (78.9 kg)  07/23/17 170 lb 12.8 oz (77.5 kg)     General appearance: Well-nourished African-American woman HENNT: Pharynx no erythema, exudate, mass, or ulcer. No thyromegaly or thyroid nodules Lymph nodes:  Breasts: Lungs: Clear to auscultation, resonant to percussion throughout Heart: Regular rhythm, no murmur, no gallop, no rub, no click, no edema Abdomen: Soft, nontender, normal bowel sounds, no mass, no organomegaly Well-healed Pfannenstiel incision Extremities: No edema, no calf tenderness Musculoskeletal:  GU:  Vascular:  Neurologic: Alert, oriented, PERRLA, Skin: No rash or ecchymosis  Lab Results: CBC W/Diff    Component Value Date/Time   WBC 4.2 09/23/2017 1102   WBC 16.8 (H) 08/23/2017 0542   RBC 4.97 09/23/2017 1102   RBC 3.42 (L) 08/23/2017 0542   HGB 10.7 (L) 09/23/2017 1102   HCT 34.9 09/23/2017 1102   PLT 241 09/23/2017 1102   MCV 70 (L) 09/23/2017 1102   MCH 21.5 (L) 09/23/2017 1102   MCH 23.1 (L) 08/23/2017 0542   MCHC 30.7 (L) 09/23/2017 1102   MCHC  33.1 08/23/2017 0542   RDW 16.1 (H) 09/23/2017 1102   LYMPHSABS 1.4 09/23/2017 1102   MONOABS 1.0 07/16/2017 1143   EOSABS 0.0 09/23/2017 1102   BASOSABS 0.0 09/23/2017 1102     Chemistry      Component Value Date/Time   NA 137 07/16/2017 1143   K 3.7 07/16/2017 1143   CL 107 07/16/2017  1143   CO2 23 07/16/2017 1143   BUN <5 (L) 07/16/2017 1143   CREATININE 0.81 07/16/2017 1143      Component Value Date/Time   CALCIUM 9.2 07/16/2017 1143   ALKPHOS 60 07/16/2017 1143   AST 20 07/16/2017 1143   ALT 25 07/16/2017 1143   BILITOT 0.3 07/16/2017 1143       Radiological Studies: No results found.  Impression:  History of low volume pulmonary embolus related to a surgical procedure. Anticoagulated during current pregnancy in view of previous PE, need for a C-section and myomectomy, and in view of her age. She is doing well at this time.  I advised her to stay on an iron supplement for another 2 months.  She will complete planned prophylactic anticoagulation this Thursday.  Overall I think she is low risk for a thrombotic event in the future and I do not think that she needs any special prophylaxis to cover surgical procedures. I did not schedule a formal follow-up appointment in this office.  CC: Patient Care Team: Panosh, Standley Brooking, MD as PCP - General Beryle Beams, Alyson Locket, MD as Consulting Physician (Oncology) Governor Specking, MD as Consulting Physician (Obstetrics and Gynecology) Servando Salina, MD as Consulting Physician (Obstetrics and Gynecology)   Murriel Hopper, MD, FACP  Hematology-Oncology/Internal Medicine     5/28/20191:56 PM

## 2017-10-01 NOTE — Patient Instructions (Signed)
OK to stop lovenox after this Thursday 5/30 Continue iron supplement for next 2 months Return visit with Dr Darnell Level, Hematology, only as needed

## 2017-10-03 DIAGNOSIS — F4322 Adjustment disorder with anxiety: Secondary | ICD-10-CM | POA: Diagnosis not present

## 2017-10-07 DIAGNOSIS — Z1151 Encounter for screening for human papillomavirus (HPV): Secondary | ICD-10-CM | POA: Diagnosis not present

## 2017-10-07 DIAGNOSIS — Z124 Encounter for screening for malignant neoplasm of cervix: Secondary | ICD-10-CM | POA: Diagnosis not present

## 2017-10-07 DIAGNOSIS — Z113 Encounter for screening for infections with a predominantly sexual mode of transmission: Secondary | ICD-10-CM | POA: Diagnosis not present

## 2017-10-07 DIAGNOSIS — Z130 Encounter for screening for diseases of the blood and blood-forming organs and certain disorders involving the immune mechanism: Secondary | ICD-10-CM | POA: Diagnosis not present

## 2017-10-15 DIAGNOSIS — F4323 Adjustment disorder with mixed anxiety and depressed mood: Secondary | ICD-10-CM | POA: Diagnosis not present

## 2017-10-31 ENCOUNTER — Encounter (HOSPITAL_COMMUNITY): Payer: Self-pay | Admitting: Emergency Medicine

## 2017-10-31 ENCOUNTER — Ambulatory Visit: Payer: Self-pay | Admitting: *Deleted

## 2017-10-31 ENCOUNTER — Ambulatory Visit (HOSPITAL_COMMUNITY)
Admission: EM | Admit: 2017-10-31 | Discharge: 2017-10-31 | Disposition: A | Discharge status: Home / Self Care | Payer: BLUE CROSS/BLUE SHIELD | Attending: Family Medicine | Admitting: Family Medicine

## 2017-10-31 DIAGNOSIS — M6740 Ganglion, unspecified site: Secondary | ICD-10-CM

## 2017-10-31 MED ORDER — DICLOFENAC SODIUM 75 MG PO TBEC: 75.0000 mg | DELAYED_RELEASE_TABLET | Freq: Two times a day (BID) | ORAL | 0 refills | Status: DC

## 2017-10-31 NOTE — ED Triage Notes (Signed)
Pt states she noticed a knot on her R hand today, it moves when she moves her fingers. No pain.

## 2017-10-31 NOTE — Telephone Encounter (Signed)
Pt called feeling very anxious about a knot on her right hand in between the wrist and top part of her hand. Pt states she just noticed the area today. Pt states the know is smaller than a dime and it moves when her ring finger and she feels a little pressure. Pt denies any pain, redness or other symptoms at this time.  Pt advised to seek treatment in Urgent Care due to anxious and about knot that was on her hand. Pt verbalized understanding.   Reason for Disposition . [1] Small swelling or lump AND [2] unexplained AND [3] present < 1 week  Answer Assessment - Initial Assessment Questions 1. APPEARANCE of SWELLING: "What does it look like?" (e.g., lymph node, insect bite, mole)     Area smaller than a dime 2. SIZE: "How large is the swelling?" (inches, cm or compare to coins)     Smaller than a dime 3. LOCATION: "Where is the swelling located?"     Top of right hand 4. ONSET: "When did the swelling start?"    today 5. PAIN: "Is it painful?" If so, ask: "How much?"     No pain 6. ITCH: "Does it itch?" If so, ask: "How much?"     no 7. CAUSE: "What do you think caused the swelling?"     8. OTHER SYMPTOMS: "Do you have any other symptoms?" (e.g., fever)  Protocols used: SKIN LUMP OR LOCALIZED SWELLING-A-AH

## 2017-10-31 NOTE — ED Provider Notes (Signed)
Jessica Dudley   [id number] 10/31/17 Arrival Time: 5465  ASSESSMENT & PLAN:  1. Ganglion cyst    Meds ordered this encounter  Medications  . diclofenac (VOLTAREN) 75 MG EC tablet    Sig: Take 1 tablet (75 mg total) by mouth 2 (two) times daily.    Dispense:  14 tablet    Refill:  0   Natural history and expected course discussed. Questions answered. NSAIDs per medication orders. Reassured.  Follow-up Information    Panosh, Standley Brooking, MD.   Specialties:  Internal Medicine, Pediatrics Why:  As needed. Contact information: Jessica Dudley [address] [phone]          Reviewed expectations re: course of current medical issues. Questions answered. Outlined signs and symptoms indicating need for more acute intervention. Patient verbalized understanding. After Visit Summary given.  SUBJECTIVE: History from: patient. Jessica Dudley is a 42 y.o. female who reports 'a knot' on her R dorsal hand. First noticed today. Injury/trama: no. Relieved by: rest. Worsened by: certain movements. Associated symptoms: none reported. Extremity sensation changes or weakness: none. Self treatment: none. History of similar: no  ROS: As per HPI.   OBJECTIVE:  Vitals:   10/31/17 1842  BP: 131/69  Pulse: 78  Resp: 18  Temp: 98.2 F (36.8 C)  SpO2: 100%    General appearance: alert; no distress Extremities: warm and well perfused; very small ganglion cyst on dorsal R hand; non-tender CV: normal extremity capillary refill Skin: warm and dry Psychological: alert and cooperative; normal mood and affect  Allergies  Allergen Reactions  . Norco [Hydrocodone-Acetaminophen] Nausea And Vomiting  . Oxycodone-Acetaminophen Nausea And Vomiting    Past Medical History:  Diagnosis Date  . Hx of pulmonary embolus 07/23/2017  . In vitro fertilization 07/02/2016   Hematology supervision in view of previous PE 12/11/15  . Iron deficiency anemia   . PCOS  (polycystic ovarian syndrome)   . Pregnancy, high-risk 07/23/2017  . Uterine fibroid   . Wears glasses    Social History   Socioeconomic History  . Marital status: Married    Spouse name: Not on file  . Number of children: Not on file  . Years of education: Not on file  . Highest education level: Not on file  Occupational History  . Not on file  Social Needs  . Financial resource strain: Not on file  . Food insecurity:    Worry: Not on file    Inability: Not on file  . Transportation needs:    Medical: Not on file    Non-medical: Not on file  Tobacco Use  . Smoking status: Never Smoker  . Smokeless tobacco: Never Used  Substance and Sexual Activity  . Alcohol use: No  . Drug use: No  . Sexual activity: Yes    Birth control/protection: None  Lifestyle  . Physical activity:    Days per week: Not on file    Minutes per session: Not on file  . Stress: Not on file  Relationships  . Social connections:    Talks on phone: Not on file    Gets together: Not on file    Attends religious service: Not on file    Active member of club or organization: Not on file    Attends meetings of clubs or organizations: Not on file    Relationship status: Not on file  . Intimate partner violence:    Fear of current or ex partner: Not on file  Emotionally abused: Not on file    Physically abused: Not on file    Forced sexual activity: Not on file  Other Topics Concern  . Not on file  Social History Narrative   Divorced  Remarried   In vitro to deliver in April   On lovenox   Regular exercise- no   HH of 2 with daughter no pets       Age 84 daughter    Works [employer]   40 hours    MOM ruby Jessica Dudley   Family History  Problem Relation Age of Onset  . Hypertension Mother   . Prostate cancer Paternal Uncle   . Diabetes Maternal Grandmother   . Leukemia Cousin   . Brain cancer Cousin   . Deep vein thrombosis Father   . Varicose Veins Father    Past Surgical History:  Procedure  Laterality Date  . CESAREAN SECTION WITH BILATERAL TUBAL LIGATION N/A 08/22/2017   Procedure: PRIMARY CESAREAN SECTION WITH BILATERAL TUBAL LIGATION;  Surgeon: Jessica Salina, MD;  Location: [facility];  Service: Obstetrics;  Laterality: N/A;  Classical incision EDD: 09/12/17 Allergy: Vicodin  . CHROMOPERTUBATION N/A 11/29/2015   Procedure: ABDOMINAL MYOMECTOMY;  Surgeon: Jessica Specking, MD;  Location: [facility];  Service: Gynecology;  Laterality: N/A;  . MYOMECTOMY    . MYOMECTOMY N/A 08/22/2017   Procedure: MYOMECTOMY;  Surgeon: Jessica Salina, MD;  Location: [facility];  Service: Obstetrics;  Laterality: N/A;  . NO PAST SURGERIES        Jessica Kick, MD 11/13/17 1020

## 2017-11-11 ENCOUNTER — Encounter: Payer: Self-pay | Admitting: Internal Medicine

## 2017-11-11 ENCOUNTER — Ambulatory Visit (INDEPENDENT_AMBULATORY_CARE_PROVIDER_SITE_OTHER): Payer: BLUE CROSS/BLUE SHIELD | Admitting: Internal Medicine

## 2017-11-11 VITALS — BP 115/82 | HR 83 | Temp 97.8°F | Wt 151.0 lb

## 2017-11-11 DIAGNOSIS — M67431 Ganglion, right wrist: Secondary | ICD-10-CM

## 2017-11-11 NOTE — Progress Notes (Signed)
Chief Complaint  Patient presents with  . Follow-up    HPI: Jessica Dudley 42 y.o. come in for ed fu for new lump on right hand  Dx with  Ganglion cyst? 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   Seen in ed  And not chnages  in size .  Is right handed .    To go back to work in August   Has 18 mos old  Infant .   No systemic sx  No numbness or weakness in hands ROS: See pertinent positives and negatives per HPI.  Past Medical History:  Diagnosis Date  . Hx of pulmonary embolus 07/23/2017  . In vitro fertilization 07/02/2016   Hematology supervision in view of previous PE 12/11/15  . Iron deficiency anemia   . PCOS (polycystic ovarian syndrome)   . Pregnancy, high-risk 07/23/2017  . Uterine fibroid   . Wears glasses     Family History  Problem Relation Age of Onset  . Hypertension Mother   . Prostate cancer Paternal Uncle   . Diabetes Maternal Grandmother   . Leukemia Cousin   . Brain cancer Cousin   . Deep vein thrombosis Father   . Varicose Veins Father     Social History   Socioeconomic History  . Marital status: Married    Spouse name: Not on file  . Number of children: Not on file  . Years of education: Not on file  . Highest education level: Not on file  Occupational History  . Not on file  Social Needs  . Financial resource strain: Not on file  . Food insecurity:    Worry: Not on file    Inability: Not on file  . Transportation needs:    Medical: Not on file    Non-medical: Not on file  Tobacco Use  . Smoking status: Never Smoker  . Smokeless tobacco: Never Used  Substance and Sexual Activity  . Alcohol use: No  . Drug use: No  . Sexual activity: Yes    Birth control/protection: None  Lifestyle  . Physical activity:    Days per week: Not on file    Minutes per session: Not on file  . Stress: Not on  file  Relationships  . Social connections:    Talks on phone: Not on file    Gets together: Not on file    Attends religious service: Not on file    Active member of club or organization: Not on file    Attends meetings of clubs or organizations: Not on file    Relationship status: Not on file  Other Topics Concern  . Not on file  Social History Narrative   Divorced  Remarried   In vitro to deliver in April   On lovenox   Regular exercise- no   HH of 2 with daughter no pets       Age 62 daughter    Works [employer]   40 hours    MOM ruby Domagalski    Outpatient Medications Prior to Visit  Medication Sig Dispense Refill  . Magnesium Oxide (MAG-OX 400 PO) Take 400 mg by mouth daily.    . Prenatal Vit-Fe Fumarate-FA (PRENATAL MULTIVITAMIN) TABS tablet Take 1 tablet by mouth daily at 12 noon.    Marland Kitchen  valACYclovir (VALTREX) 500 MG tablet Take 1 tablet (500 mg total) by mouth 2 (two) times daily. For 3-5 days for outbreak 30 tablet 5  . vitamin C (ASCORBIC ACID) 500 MG tablet Take 500 mg by mouth daily.    . Iron, Ferrous Gluconate, 256 (28 Fe) MG TABS Take 256 mg by mouth daily. (Patient not taking: Reported on 10/31/2017) 30 tablet 3  . diclofenac (VOLTAREN) 75 MG EC tablet Take 1 tablet (75 mg total) by mouth 2 (two) times daily. (Patient not taking: Reported on 11/11/2017) 14 tablet 0  . enoxaparin (LOVENOX) 150 MG/ML injection Inject 0.67 mLs (100 mg total) into the skin daily. (Patient not taking: Reported on 10/31/2017) 30 Syringe 6  . HYDROmorphone (DILAUDID) 2 MG tablet Take 1 tablet (2 mg total) by mouth every 4 (four) hours as needed for severe pain. (Patient not taking: Reported on 10/31/2017) 15 tablet 0   No facility-administered medications prior to visit.      EXAM:  BP 115/82   Pulse 83   Temp 97.8 F (36.6 C)   Wt 151 lb (68.5 kg)   BMI 27.62 kg/m   Body mass index is 27.62 kg/m.  GENERAL: vitals reviewed and listed above, alert, oriented, appears well hydrated and in no  acute distress HEENT: atraumatic, conjunctiva  clear, no obvious abnormalities on inspection of external nose and earsMS: moves all extremities  Right dorsum hand wrist  With popcorn kernal sized nodule  Firm but mmibile and  mibile attacked to  Ring finger? Tendon   No redness  No weakness or atrophy and NV is intact  PSYCH: pleasant and cooperative,   BP Readings from Last 3 Encounters:  11/11/17 115/82  10/31/17 131/69  10/01/17 119/74    ASSESSMENT AND PLAN:  Discussed the following assessment and plan:  Ganglion cyst of dorsum of right wrist - agree with ed dx and disc benighn nature  disc whrist hand  overuse phenom consider soft slpint if needed and fu if needed concerns   In setting of increase  Use wrist and hand .  But  No alarm features  Or crepitus etc    Expectant management. And awareness of wrist overuse and  Poss soft support when  Active Fu or contact  If alarm sx     Reassurance  -Patient advised to return or notify health care team  if  new concerns arise.  Patient Instructions   I agree this is a ganglion. cyst .     And  They can come and go.      Consider  .   Soft slpinting   When  Using wrist     If getting weak or numb  In fingers then consider seeing  Hand specialist but  At this time  Any  intervention would be more detrimental that  Leaving it alone.                Standley Brooking. Panosh M.D.

## 2017-11-11 NOTE — Patient Instructions (Signed)
  I agree this is a ganglion. cyst .     And  They can come and go.      Consider  .   Soft slpinting   When  Using wrist     If getting weak or numb  In fingers then consider seeing  Hand specialist but  At this time  Any  intervention would be more detrimental that  Leaving it alone.

## 2018-04-16 DIAGNOSIS — Z1231 Encounter for screening mammogram for malignant neoplasm of breast: Secondary | ICD-10-CM | POA: Diagnosis not present

## 2018-04-17 ENCOUNTER — Other Ambulatory Visit: Payer: Self-pay | Admitting: Oncology

## 2018-05-14 DIAGNOSIS — D251 Intramural leiomyoma of uterus: Secondary | ICD-10-CM | POA: Diagnosis not present

## 2018-05-14 DIAGNOSIS — N83292 Other ovarian cyst, left side: Secondary | ICD-10-CM | POA: Diagnosis not present

## 2018-05-24 NOTE — Progress Notes (Signed)
Chief Complaint  Patient presents with  . Annual Exam    Referral to Dermatology for Sebacious cysts    HPI: Patient  Jessica Dudley  43 y.o. comes in today for Preventive Health Care visit  Sees gyne  Now 9 mos out form c sec pregnancy no clots doing well x heavy menses  Had btl  Health Maintenance  Topic Date Due  . PAP SMEAR-Modifier  10/06/2018 (Originally 05/30/2015)  . TETANUS/TDAP  03/01/2019  . INFLUENZA VACCINE  Completed  . HIV Screening  Completed   Health Maintenance Review LIFESTYLE:  Exercise:   Trying to walk at lunch breaks  Tobacco/ETS: no Alcohol:   no Sugar beverages:fasting with church .  Sleep: newborn.  9 mos .  Old at home  6 hiours  Drug use: no HH of  4  Work: 40 hours   Heavy periods  Following per gyne no other bleeding Had btl  Not iron but prenatals  Until left     ROS:  mutliple skin cysts bothersome off and on left arm back etc  GEN/ HEENT: No fever, significant weight changes sweats headaches vision problems hearing changes, CV/ PULM; No chest pain shortness of breath cough, syncope,edema  change in exercise tolerance. GI /GU: No adominal pain, vomiting, change in bowel habits. No blood in the stool. No significant GU symptoms. SKIN/HEME: ,no acute skin rashes suspicious lesions or bleeding. No lymphadenopathy, nodules, masses.  NEURO/ PSYCH:  No neurologic signs such as weakness numbness. No depression anxiety. IMM/ Allergy: No unusual infections.  Allergy .   REST of 12 system review negative except as per HPI   Past Medical History:  Diagnosis Date  . Hx of pulmonary embolus 07/23/2017  . In vitro fertilization 07/02/2016   Hematology supervision in view of previous PE 12/11/15  . Iron deficiency anemia   . PCOS (polycystic ovarian syndrome)   . Pregnancy, high-risk 07/23/2017  . Uterine fibroid   . Wears glasses     Past Surgical History:  Procedure Laterality Date  . CESAREAN SECTION WITH BILATERAL TUBAL LIGATION N/A  08/22/2017   Procedure: PRIMARY CESAREAN SECTION WITH BILATERAL TUBAL LIGATION;  Surgeon: Servando Salina, MD;  Location: [facility];  Service: Obstetrics;  Laterality: N/A;  Classical incision EDD: 09/12/17 Allergy: Vicodin  . CHROMOPERTUBATION N/A 11/29/2015   Procedure: ABDOMINAL MYOMECTOMY;  Surgeon: Governor Specking, MD;  Location: [facility];  Service: Gynecology;  Laterality: N/A;  . MYOMECTOMY    . MYOMECTOMY N/A 08/22/2017   Procedure: MYOMECTOMY;  Surgeon: Servando Salina, MD;  Location: [facility];  Service: Obstetrics;  Laterality: N/A;  . NO PAST SURGERIES      Family History  Problem Relation Age of Onset  . Hypertension Mother   . Prostate cancer Paternal Uncle   . Diabetes Maternal Grandmother   . Leukemia Cousin   . Brain cancer Cousin   . Deep vein thrombosis Father   . Varicose Veins Father     Social History   Socioeconomic History  . Marital status: Married    Spouse name: Not on file  . Number of children: Not on file  . Years of education: Not on file  . Highest education level: Not on file  Occupational History  . Not on file  Social Needs  . Financial resource strain: Not on file  . Food insecurity:    Worry: Not on file    Inability: Not on file  . Transportation needs:  Medical: Not on file    Non-medical: Not on file  Tobacco Use  . Smoking status: Never Smoker  . Smokeless tobacco: Never Used  Substance and Sexual Activity  . Alcohol use: No  . Drug use: No  . Sexual activity: Yes    Birth control/protection: None  Lifestyle  . Physical activity:    Days per week: Not on file    Minutes per session: Not on file  . Stress: Not on file  Relationships  . Social connections:    Talks on phone: Not on file    Gets together: Not on file    Attends religious service: Not on file    Active member of club or organization: Not on file    Attends meetings of clubs or organizations: Not on file     Relationship status: Not on file  Other Topics Concern  . Not on file  Social History Narrative   Divorced  Remarried   In vitro to deliver in April   On lovenox   Regular exercise- no   HH of 2 with daughter no pets       Age 20 daughter    Works [employer]   40 hours    MOM ruby Correa    Outpatient Medications Prior to Visit  Medication Sig Dispense Refill  . Iron, Ferrous Gluconate, 256 (28 Fe) MG TABS Take 256 mg by mouth daily. 30 tablet 3  . Magnesium Oxide (MAG-OX 400 PO) Take 400 mg by mouth daily.    . Prenatal Vit-Fe Fumarate-FA (PRENATAL MULTIVITAMIN) TABS tablet Take 1 tablet by mouth daily at 12 noon.    . valACYclovir (VALTREX) 500 MG tablet Take 1 tablet (500 mg total) by mouth 2 (two) times daily. For 3-5 days for outbreak 30 tablet 5  . vitamin C (ASCORBIC ACID) 500 MG tablet Take 500 mg by mouth daily.     No facility-administered medications prior to visit.      EXAM:  BP 110/68 (BP Location: Right Arm, Patient Position: Sitting, Cuff Size: Normal)   Pulse 81   Temp 97.8 F (36.6 C) (Oral)   Ht '5\' 1"'  (1.549 m)   Wt 145 lb 3.2 oz (65.9 kg)   BMI 27.44 kg/m   Body mass index is 27.44 kg/m. Wt Readings from Last 3 Encounters:  05/26/18 145 lb 3.2 oz (65.9 kg)  11/11/17 151 lb (68.5 kg)  10/01/17 145 lb 4.8 oz (65.9 kg)    Physical Exam: Vital signs reviewed NGE:XBMW is a well-developed well-nourished alert cooperative    who appearsr stated age in no acute distress.  HEENT: normocephalic atraumatic , Eyes: PERRL EOM's full, conjunctiva clear, Nares: paten,t no deformity discharge or tenderness., Ears: no deformity EAC's clear TMs with normal landmarks. Mouth: clear OP, no lesions, edema.  Moist mucous membranes. Dentition in adequate repair. NECK: supple without masses, thyromegaly or bruits. CHEST/PULM:  Clear to auscultation and percussion breath sounds equal no wheeze , rales or rhonchi. No chest wall deformities or tenderness. Breast: normal by inspection  . No dimpling, discharge, masses, tenderness or discharge . CV: PMI is nondisplaced, S1 S2 no gallops, murmurs, rubs. Peripheral pulses are full without delay.No JVD .  ABDOMEN: Bowel sounds normal nontender  No guard or rebound, no hepato splenomegal no CVA tenderness.  No hernia. Extremtities:  No clubbing cyanosis or edema, no acute joint swelling or redness no focal atrophy NEURO:  Oriented x3, cranial nerves 3-12 appear to be intact, no obvious focal  weakness,gait within normal limits no abnormal reflexes or asymmetrical SKIN: No acute rashes normal turgor, color, no bruising or petechiae. 3 skin seb cust  Arm back ? Other  No redness PSYCH: Oriented, good eye contact, no obvious depression anxiety, cognition and judgment appear normal. LN: no cervical axillary inguinal adenopathy  Lab Results  Component Value Date   WBC 4.2 09/23/2017   HGB 10.7 (L) 09/23/2017   HCT 34.9 09/23/2017   PLT 241 09/23/2017   GLUCOSE 82 07/16/2017   CHOL 165 02/15/2016   TRIG 46.0 02/15/2016   HDL 59.30 02/15/2016   LDLCALC 96 02/15/2016   ALT 25 07/16/2017   AST 20 07/16/2017   NA 137 07/16/2017   K 3.7 07/16/2017   CL 107 07/16/2017   CREATININE 0.81 07/16/2017   BUN <5 (L) 07/16/2017   CO2 23 07/16/2017   TSH 0.93 02/15/2016   INR 1.08 12/11/2015    BP Readings from Last 3 Encounters:  05/26/18 110/68  11/11/17 115/82  10/31/17 131/69     ASSESSMENT AND PLAN:  Discussed the following assessment and plan:  Visit for preventive health examination - Plan: Basic metabolic panel, CBC with Differential/Platelet, Hepatic function panel, Lipid panel, TSH  Medication management - Plan: Basic metabolic panel, CBC with Differential/Platelet, Hepatic function panel, Lipid panel, TSH  Hx pulmonary embolism - Plan: Basic metabolic panel, CBC with Differential/Platelet, Hepatic function panel, Lipid panel, TSH  Anemia, unspecified type - Plan: Basic metabolic panel, CBC with  Differential/Platelet, Hepatic function panel, Lipid panel, TSH Anemia post partum and heavy periods doing well fu gyne  Screen today . Is utd on HCM paramters   Disc healthy eating  Will see derm  Dr Lenard Simmer al  .  Patient Care Team: Regis Bill Standley Brooking, MD as PCP - General Beryle Beams Alyson Locket, MD as Consulting Physician (Oncology) Governor Specking, MD as Consulting Physician (Obstetrics and Gynecology) Servando Salina, MD as Consulting Physician (Obstetrics and Gynecology) Patient Instructions  Continue lifestyle intervention healthy eating and exercise .  Will notify you  of labs when available.  See derm and let us know if need more help with this .      Preventive Care 40-64 Years, Female Preventive care refers to lifestyle choices and visits with your health care provider that can promote health and wellness. What does preventive care include?   A yearly physical exam. This is also called an annual well check.  Dental exams once or twice a year.  Routine eye exams. Ask your health care provider how often you should have your eyes checked.  Personal lifestyle choices, including: ? Daily care of your teeth and gums. ? Regular physical activity. ? Eating a healthy diet. ? Avoiding tobacco and drug use. ? Limiting alcohol use. ? Practicing safe sex. ? Taking low-dose aspirin daily starting at age 86. ? Taking vitamin and mineral supplements as recommended by your health care provider. What happens during an annual well check? The services and screenings done by your health care provider during your annual well check will depend on your age, overall health, lifestyle risk factors, and family history of disease. Counseling Your health care provider may ask you questions about your:  Alcohol use.  Tobacco use.  Drug use.  Emotional well-being.  Home and relationship well-being.  Sexual activity.  Eating habits.  Work and work Statistician.  Method of birth  control.  Menstrual cycle.  Pregnancy history. Screening You may have the following tests or measurements:  Height, weight,  and BMI.  Blood pressure.  Lipid and cholesterol levels. These may be checked every 5 years, or more frequently if you are over 18 years old.  Skin check.  Lung cancer screening. You may have this screening every year starting at age 91 if you have a 30-pack-year history of smoking and currently smoke or have quit within the past 15 years.  Colorectal cancer screening. All adults should have this screening starting at age 67 and continuing until age 28. Your health care provider may recommend screening at age 30. You will have tests every 1-10 years, depending on your results and the type of screening test. People at increased risk should start screening at an earlier age. Screening tests may include: ? Guaiac-based fecal occult blood testing. ? Fecal immunochemical test (FIT). ? Stool DNA test. ? Virtual colonoscopy. ? Sigmoidoscopy. During this test, a flexible tube with a tiny camera (sigmoidoscope) is used to examine your rectum and lower colon. The sigmoidoscope is inserted through your anus into your rectum and lower colon. ? Colonoscopy. During this test, a long, thin, flexible tube with a tiny camera (colonoscope) is used to examine your entire colon and rectum.  Hepatitis C blood test.  Hepatitis B blood test.  Sexually transmitted disease (STD) testing.  Diabetes screening. This is done by checking your blood sugar (glucose) after you have not eaten for a while (fasting). You may have this done every 1-3 years.  Mammogram. This may be done every 1-2 years. Talk to your health care provider about when you should start having regular mammograms. This may depend on whether you have a family history of breast cancer.  BRCA-related cancer screening. This may be done if you have a family history of breast, ovarian, tubal, or peritoneal cancers.  Pelvic  exam and Pap test. This may be done every 3 years starting at age 1. Starting at age 55, this may be done every 5 years if you have a Pap test in combination with an HPV test.  Bone density scan. This is done to screen for osteoporosis. You may have this scan if you are at high risk for osteoporosis. Discuss your test results, treatment options, and if necessary, the need for more tests with your health care provider. Vaccines Your health care provider may recommend certain vaccines, such as:  Influenza vaccine. This is recommended every year.  Tetanus, diphtheria, and acellular pertussis (Tdap, Td) vaccine. You may need a Td booster every 10 years.  Varicella vaccine. You may need this if you have not been vaccinated.  Zoster vaccine. You may need this after age 77.  Measles, mumps, and rubella (MMR) vaccine. You may need at least one dose of MMR if you were born in 1957 or later. You may also need a second dose.  Pneumococcal 13-valent conjugate (PCV13) vaccine. You may need this if you have certain conditions and were not previously vaccinated.  Pneumococcal polysaccharide (PPSV23) vaccine. You may need one or two doses if you smoke cigarettes or if you have certain conditions.  Meningococcal vaccine. You may need this if you have certain conditions.  Hepatitis A vaccine. You may need this if you have certain conditions or if you travel or work in places where you may be exposed to hepatitis A.  Hepatitis B vaccine. You may need this if you have certain conditions or if you travel or work in places where you may be exposed to hepatitis B.  Haemophilus influenzae type b (Hib) vaccine. You may  need this if you have certain conditions. Talk to your health care provider about which screenings and vaccines you need and how often you need them. This information is not intended to replace advice given to you by your health care provider. Make sure you discuss any questions you have with  your health care provider. Document Released: 05/20/2015 Document Revised: 06/13/2017 Document Reviewed: 02/22/2015 Elsevier Interactive Patient Education  2019 [place] K. Panosh M.D.

## 2018-05-26 ENCOUNTER — Ambulatory Visit (INDEPENDENT_AMBULATORY_CARE_PROVIDER_SITE_OTHER): Payer: BLUE CROSS/BLUE SHIELD | Admitting: Internal Medicine

## 2018-05-26 ENCOUNTER — Encounter: Payer: Self-pay | Admitting: Internal Medicine

## 2018-05-26 VITALS — BP 110/68 | HR 81 | Temp 97.8°F | Ht 61.0 in | Wt 145.2 lb

## 2018-05-26 DIAGNOSIS — Z79899 Other long term (current) drug therapy: Secondary | ICD-10-CM

## 2018-05-26 DIAGNOSIS — D649 Anemia, unspecified: Secondary | ICD-10-CM | POA: Diagnosis not present

## 2018-05-26 DIAGNOSIS — Z0000 Encounter for general adult medical examination without abnormal findings: Secondary | ICD-10-CM

## 2018-05-26 DIAGNOSIS — Z86711 Personal history of pulmonary embolism: Secondary | ICD-10-CM

## 2018-05-26 LAB — CBC WITH DIFFERENTIAL/PLATELET
Basophils Absolute: 0 10*3/uL (ref 0.0–0.1)
Basophils Relative: 1 % (ref 0.0–3.0)
Eosinophils Absolute: 0 10*3/uL (ref 0.0–0.7)
Eosinophils Relative: 0.5 % (ref 0.0–5.0)
HCT: 38.4 % (ref 36.0–46.0)
Hemoglobin: 12.1 g/dL (ref 12.0–15.0)
Lymphocytes Relative: 33.7 % (ref 12.0–46.0)
Lymphs Abs: 1.3 10*3/uL (ref 0.7–4.0)
MCHC: 31.6 g/dL (ref 30.0–36.0)
MCV: 65.9 fl — ABNORMAL LOW (ref 78.0–100.0)
Monocytes Absolute: 0.4 10*3/uL (ref 0.1–1.0)
Monocytes Relative: 9.8 % (ref 3.0–12.0)
Neutro Abs: 2.1 10*3/uL (ref 1.4–7.7)
Neutrophils Relative %: 55 % (ref 43.0–77.0)
Platelets: 231 10*3/uL (ref 150.0–400.0)
RBC: 5.83 Mil/uL — ABNORMAL HIGH (ref 3.87–5.11)
RDW: 14.7 % (ref 11.5–15.5)
WBC: 3.8 10*3/uL — ABNORMAL LOW (ref 4.0–10.5)

## 2018-05-26 LAB — BASIC METABOLIC PANEL
BUN: 13 mg/dL (ref 6–23)
CO2: 26 mEq/L (ref 19–32)
Calcium: 9.6 mg/dL (ref 8.4–10.5)
Chloride: 106 mEq/L (ref 96–112)
Creatinine, Ser: 0.99 mg/dL (ref 0.40–1.20)
GFR: 74.57 mL/min (ref >60.00)
Glucose, Bld: 81 mg/dL (ref 70–99)
Potassium: 4.1 mEq/L (ref 3.5–5.1)
Sodium: 139 mEq/L (ref 135–145)

## 2018-05-26 LAB — LIPID PANEL
Cholesterol: 158 mg/dL (ref 0–200)
HDL: 48.2 mg/dL (ref >39.00)
LDL Cholesterol: 98 mg/dL (ref 0–99)
NonHDL: 110.03
Total CHOL/HDL Ratio: 3
Triglycerides: 62 mg/dL (ref 0.0–149.0)
VLDL: 12.4 mg/dL (ref 0.0–40.0)

## 2018-05-26 LAB — HEPATIC FUNCTION PANEL
ALT: 11 U/L (ref 0–35)
AST: 11 U/L (ref 0–37)
Albumin: 4.5 g/dL (ref 3.5–5.2)
Alkaline Phosphatase: 48 U/L (ref 39–117)
Bilirubin, Direct: 0.1 mg/dL (ref 0.0–0.3)
Total Bilirubin: 0.6 mg/dL (ref 0.2–1.2)
Total Protein: 7.1 g/dL (ref 6.0–8.3)

## 2018-05-26 LAB — TSH: TSH: 0.55 u[IU]/mL (ref 0.35–4.50)

## 2018-05-26 NOTE — Patient Instructions (Addendum)
Continue lifestyle intervention healthy eating and exercise .  Will notify you  of labs when available.  See derm and let us know if need more help with this .      Preventive Care 40-64 Years, Female Preventive care refers to lifestyle choices and visits with your health care provider that can promote health and wellness. What does preventive care include?   A yearly physical exam. This is also called an annual well check.  Dental exams once or twice a year.  Routine eye exams. Ask your health care provider how often you should have your eyes checked.  Personal lifestyle choices, including: ? Daily care of your teeth and gums. ? Regular physical activity. ? Eating a healthy diet. ? Avoiding tobacco and drug use. ? Limiting alcohol use. ? Practicing safe sex. ? Taking low-dose aspirin daily starting at age 87. ? Taking vitamin and mineral supplements as recommended by your health care provider. What happens during an annual well check? The services and screenings done by your health care provider during your annual well check will depend on your age, overall health, lifestyle risk factors, and family history of disease. Counseling Your health care provider may ask you questions about your:  Alcohol use.  Tobacco use.  Drug use.  Emotional well-being.  Home and relationship well-being.  Sexual activity.  Eating habits.  Work and work Statistician.  Method of birth control.  Menstrual cycle.  Pregnancy history. Screening You may have the following tests or measurements:  Height, weight, and BMI.  Blood pressure.  Lipid and cholesterol levels. These may be checked every 5 years, or more frequently if you are over 79 years old.  Skin check.  Lung cancer screening. You may have this screening every year starting at age 56 if you have a 30-pack-year history of smoking and currently smoke or have quit within the past 15 years.  Colorectal cancer screening.  All adults should have this screening starting at age 58 and continuing until age 37. Your health care provider may recommend screening at age 49. You will have tests every 1-10 years, depending on your results and the type of screening test. People at increased risk should start screening at an earlier age. Screening tests may include: ? Guaiac-based fecal occult blood testing. ? Fecal immunochemical test (FIT). ? Stool DNA test. ? Virtual colonoscopy. ? Sigmoidoscopy. During this test, a flexible tube with a tiny camera (sigmoidoscope) is used to examine your rectum and lower colon. The sigmoidoscope is inserted through your anus into your rectum and lower colon. ? Colonoscopy. During this test, a long, thin, flexible tube with a tiny camera (colonoscope) is used to examine your entire colon and rectum.  Hepatitis C blood test.  Hepatitis B blood test.  Sexually transmitted disease (STD) testing.  Diabetes screening. This is done by checking your blood sugar (glucose) after you have not eaten for a while (fasting). You may have this done every 1-3 years.  Mammogram. This may be done every 1-2 years. Talk to your health care provider about when you should start having regular mammograms. This may depend on whether you have a family history of breast cancer.  BRCA-related cancer screening. This may be done if you have a family history of breast, ovarian, tubal, or peritoneal cancers.  Pelvic exam and Pap test. This may be done every 3 years starting at age 54. Starting at age 30, this may be done every 5 years if you have a Pap test  in combination with an HPV test.  Bone density scan. This is done to screen for osteoporosis. You may have this scan if you are at high risk for osteoporosis. Discuss your test results, treatment options, and if necessary, the need for more tests with your health care provider. Vaccines Your health care provider may recommend certain vaccines, such  as:  Influenza vaccine. This is recommended every year.  Tetanus, diphtheria, and acellular pertussis (Tdap, Td) vaccine. You may need a Td booster every 10 years.  Varicella vaccine. You may need this if you have not been vaccinated.  Zoster vaccine. You may need this after age 11.  Measles, mumps, and rubella (MMR) vaccine. You may need at least one dose of MMR if you were born in 1957 or later. You may also need a second dose.  Pneumococcal 13-valent conjugate (PCV13) vaccine. You may need this if you have certain conditions and were not previously vaccinated.  Pneumococcal polysaccharide (PPSV23) vaccine. You may need one or two doses if you smoke cigarettes or if you have certain conditions.  Meningococcal vaccine. You may need this if you have certain conditions.  Hepatitis A vaccine. You may need this if you have certain conditions or if you travel or work in places where you may be exposed to hepatitis A.  Hepatitis B vaccine. You may need this if you have certain conditions or if you travel or work in places where you may be exposed to hepatitis B.  Haemophilus influenzae type b (Hib) vaccine. You may need this if you have certain conditions. Talk to your health care provider about which screenings and vaccines you need and how often you need them. This information is not intended to replace advice given to you by your health care provider. Make sure you discuss any questions you have with your health care provider. Document Released: 05/20/2015 Document Revised: 06/13/2017 Document Reviewed: 02/22/2015 Elsevier Interactive Patient Education  2019 [employer].

## 2018-06-09 ENCOUNTER — Telehealth: Payer: Self-pay | Admitting: *Deleted

## 2018-06-09 NOTE — Telephone Encounter (Signed)
Copied from [place] [id number]. Topic: General - Other >> Jun 09, 2018 12:26 PM Carolyn Stare wrote: Pt said he daughter was diag with flu A and is asking if there is a preventive she can take    Pharmacy  [facility]

## 2018-06-09 NOTE — Telephone Encounter (Signed)
Called pt and let her know per dr.Panosh that if she starts having symptoms she can call back and we discuss what to send in

## 2018-07-02 DIAGNOSIS — N83201 Unspecified ovarian cyst, right side: Secondary | ICD-10-CM | POA: Diagnosis not present

## 2018-07-02 DIAGNOSIS — R9389 Abnormal findings on diagnostic imaging of other specified body structures: Secondary | ICD-10-CM | POA: Diagnosis not present

## 2018-07-21 ENCOUNTER — Encounter: Payer: Self-pay | Admitting: *Deleted

## 2018-08-11 IMAGING — CR DG ABDOMEN 1V
1 series · 1 of 1 positions shown · non-contrast
Comparison: None.

CLINICAL DATA: Acute onset abdominal pain and bloating. No bowel
lysis of adhesions and tubal ligation 08/22/2017.

EXAM:
ABDOMEN - 1 VIEW

[abdomen kub]
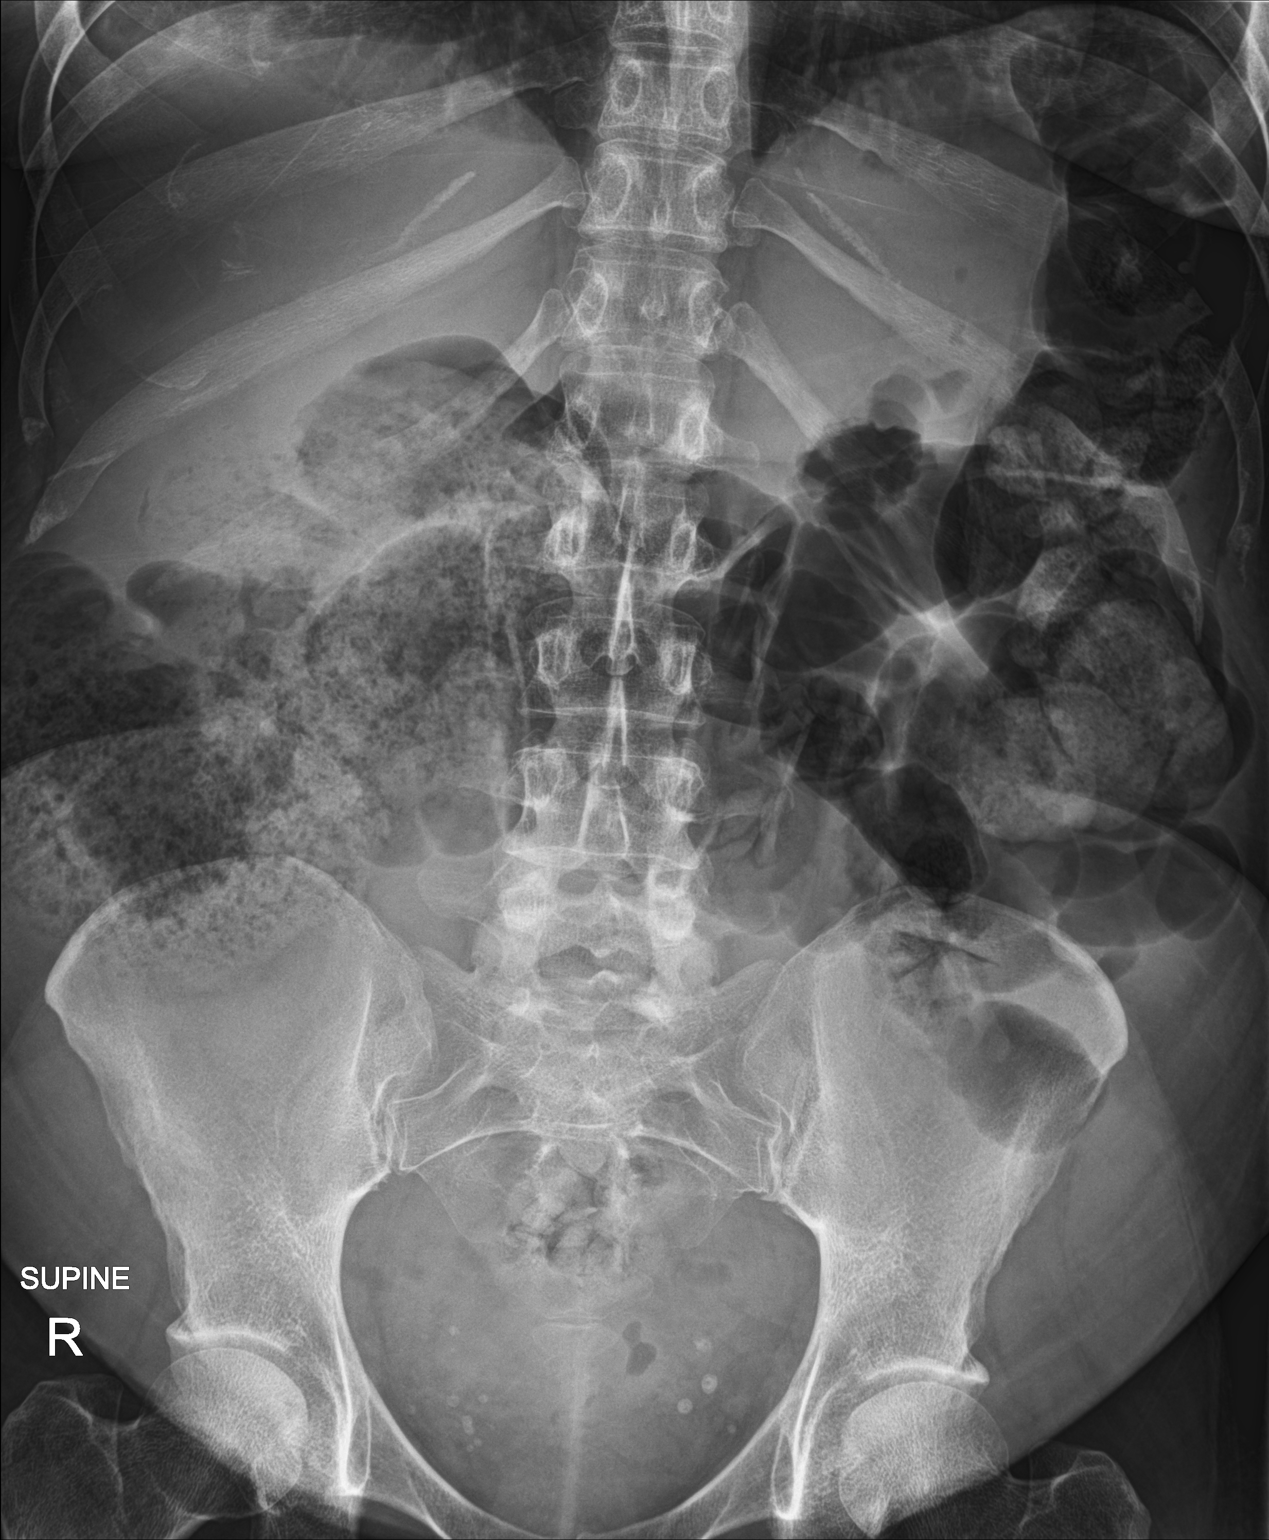

[1 of 1 positions shown; findings below may reference images not displayed]

FINDINGS: A very large volume of stool is seen in the ascending and proximal
transverse colon. Gaseous distention of the colon is identified. A
few small locules of air are seen in the rectosigmoid colon. Bowel
gas pattern is not suggestive of small bowel obstruction. A few
small locules of air in the left upper quadrant of the abdomen are
presumably within the stomach.
IMPRESSION: Massive volume of stool in the ascending and proximal transverse
colon.

Gaseous distention of the colon is suggestive of ileus. The bowel
gas pattern is not typical of small-bowel obstruction.

Linear lucencies in the upper abdomen are likely due to free
intraperitoneal air in this patient who is status post cesarean
section 3 days ago.

## 2018-09-18 DIAGNOSIS — L720 Epidermal cyst: Secondary | ICD-10-CM | POA: Diagnosis not present

## 2018-10-22 DIAGNOSIS — Z1151 Encounter for screening for human papillomavirus (HPV): Secondary | ICD-10-CM | POA: Diagnosis not present

## 2018-10-22 DIAGNOSIS — Z6828 Body mass index (BMI) 28.0-28.9, adult: Secondary | ICD-10-CM | POA: Diagnosis not present

## 2018-10-22 DIAGNOSIS — Z01419 Encounter for gynecological examination (general) (routine) without abnormal findings: Secondary | ICD-10-CM | POA: Diagnosis not present

## 2018-12-27 ENCOUNTER — Ambulatory Visit (INDEPENDENT_AMBULATORY_CARE_PROVIDER_SITE_OTHER): Payer: BC Managed Care – PPO | Admitting: Family Medicine

## 2018-12-27 ENCOUNTER — Other Ambulatory Visit: Payer: Self-pay

## 2018-12-27 ENCOUNTER — Encounter: Payer: Self-pay | Admitting: Family Medicine

## 2018-12-27 VITALS — Ht 61.0 in | Wt 145.0 lb

## 2018-12-27 DIAGNOSIS — S29019A Strain of muscle and tendon of unspecified wall of thorax, initial encounter: Secondary | ICD-10-CM | POA: Diagnosis not present

## 2018-12-27 MED ORDER — IBUPROFEN 600 MG PO TABS: 600.0000 mg | ORAL_TABLET | Freq: Three times a day (TID) | ORAL | 0 refills | Status: DC | PRN

## 2018-12-27 MED ORDER — BACLOFEN 20 MG PO TABS: 20.0000 mg | ORAL_TABLET | Freq: Three times a day (TID) | ORAL | 0 refills | Status: DC

## 2018-12-27 NOTE — Progress Notes (Signed)
Virtual Visit via Video   Due to the COVID-19 pandemic, this visit was completed with telemedicine (audio/video) technology to reduce patient and provider exposure as well as to preserve personal protective equipment.   I connected with Jessica Dudley by a video enabled telemedicine application and verified that I am speaking with the correct person using two identifiers. Location patient: Home Location provider: [facility], Office Persons participating in the virtual visit: Cannie, [place], DO Lonell Grandchild, CMA acting as scribe for Dr. Briscoe Deutscher.   I discussed the limitations of evaluation and management by telemedicine and the availability of in person appointments. The patient expressed understanding and agreed to proceed.  Care Team   Patient Care Team: Panosh, Standley Brooking, MD as PCP - General Beryle Beams Alyson Locket, MD as Consulting Physician (Oncology) Governor Specking, MD as Consulting Physician (Obstetrics and Gynecology) Servando Salina, MD as Consulting Physician (Obstetrics and Gynecology)  Subjective:   HPI: Patient has been having mid back pain x 1 week. She does have 69mo daughter. She has had issue with this in the past when moving and was treated at that time. She felt like the stretches helped a lot more than medications. Pain does not radiate anywhere. Pain level is today is 3/10 and is a on and off ache. She has tried some stretching that has helped a little. She has been using a heating pad at night and ibuprofen for pain with mild relief. She has been having symptoms for about a week.   Not breastfeeding or worried about pregnancy.  Note: Increased anxiety. No dyspepsia.  Review of Systems  Constitutional: Negative for chills and fever.  HENT: Negative for hearing loss and tinnitus.   Eyes: Negative for blurred vision and double vision.  Respiratory: Negative for cough and hemoptysis.   Cardiovascular: Negative for chest pain,  palpitations and leg swelling.  Gastrointestinal: Negative for heartburn, nausea and vomiting.  Genitourinary: Negative for dysuria and urgency.  Neurological: Negative for dizziness and headaches.  Psychiatric/Behavioral: Negative for depression and suicidal ideas.    Patient Active Problem List   Diagnosis Date Noted  . Cesarean delivery delivered 4/18 08/22/2017  . Postpartum care following cesarean delivery (4/18) 08/22/2017  . Hx of pulmonary embolus 07/23/2017  . In vitro fertilization 07/02/2016  . PE (pulmonary thromboembolism) ([place]) 12/11/2015  . Leiomyoma 11/29/2015  . Anemia 06/13/2011  . GENITAL HERPES, HX OF 11/03/2009    Social History   Tobacco Use  . Smoking status: Never Smoker  . Smokeless tobacco: Never Used  Substance Use Topics  . Alcohol use: No   Current Outpatient Medications:  .  Iron, Ferrous Gluconate, 256 (28 Fe) MG TABS, Take 256 mg by mouth daily., Disp: 30 tablet, Rfl: 3 .  Magnesium Oxide (MAG-OX 400 PO), Take 400 mg by mouth daily., Disp: , Rfl:  .  Prenatal Vit-Fe Fumarate-FA (PRENATAL MULTIVITAMIN) TABS tablet, Take 1 tablet by mouth daily at 12 noon., Disp: , Rfl:  .  valACYclovir (VALTREX) 500 MG tablet, Take 1 tablet (500 mg total) by mouth 2 (two) times daily. For 3-5 days for outbreak, Disp: 30 tablet, Rfl: 5 .  vitamin C (ASCORBIC ACID) 500 MG tablet, Take 500 mg by mouth daily., Disp: , Rfl:   Allergies  Allergen Reactions  . Norco [Hydrocodone-Acetaminophen] Nausea And Vomiting  . Oxycodone-Acetaminophen Nausea And Vomiting   Objective:   VITALS: Per patient if applicable, see vitals. GENERAL: Alert, appears well and in no acute distress. HEENT:  Atraumatic, conjunctiva clear, no obvious abnormalities on inspection of external nose and ears. NECK: Normal movements of the head and neck. CARDIOPULMONARY: No increased WOB. Speaking in clear sentences. I:E ratio WNL.  MS: Moves all visible extremities without noticeable  abnormality. PSYCH: Pleasant and cooperative, well-groomed. Speech normal rate and rhythm. Affect is appropriate. Insight and judgement are appropriate. Attention is focused, linear, and appropriate.  NEURO: CN grossly intact. Oriented as arrived to appointment on time with no prompting. Moves both UE equally.  SKIN: No obvious lesions, wounds, erythema, or cyanosis noted on face or hands.  Depression screen [facility] 2/9 05/26/2018 10/01/2017 07/23/2017  Decreased Interest 0 0 0  Down, Depressed, Hopeless 0 0 0  PHQ - 2 Score 0 0 0    Assessment and Plan:   [name] was seen today for back pain.  Diagnoses and all orders for this visit:  Thoracic myofascial strain, initial encounter -     ibuprofen (ADVIL) 600 MG tablet; Take 1 tablet (600 mg total) by mouth every 8 (eight) hours as needed. -     baclofen (LIORESAL) 20 MG tablet; Take 1 tablet (20 mg total) by mouth 3 (three) times daily.  Reviewed stretches, exercises, acupressure, precautions. Take Motrin with food. Watch for gastritis.   Marland Kitchen COVID-19 Education: The signs and symptoms of COVID-19 were discussed with the patient and how to seek care for testing if needed. The importance of social distancing was discussed today. . Reviewed expectations re: course of current medical issues. . Discussed self-management of symptoms. . Outlined signs and symptoms indicating need for more acute intervention. . Patient verbalized understanding and all questions were answered. Marland Kitchen Health Maintenance issues including appropriate healthy diet, exercise, and smoking avoidance were discussed with patient. . See orders for this visit as documented in the electronic medical record.  Briscoe Deutscher, DO

## 2018-12-31 DIAGNOSIS — Z113 Encounter for screening for infections with a predominantly sexual mode of transmission: Secondary | ICD-10-CM | POA: Diagnosis not present

## 2018-12-31 DIAGNOSIS — Z114 Encounter for screening for human immunodeficiency virus [HIV]: Secondary | ICD-10-CM | POA: Diagnosis not present

## 2018-12-31 DIAGNOSIS — Z118 Encounter for screening for other infectious and parasitic diseases: Secondary | ICD-10-CM | POA: Diagnosis not present

## 2018-12-31 DIAGNOSIS — Z1159 Encounter for screening for other viral diseases: Secondary | ICD-10-CM | POA: Diagnosis not present

## 2018-12-31 DIAGNOSIS — A5901 Trichomonal vulvovaginitis: Secondary | ICD-10-CM | POA: Diagnosis not present

## 2019-01-01 DIAGNOSIS — F4323 Adjustment disorder with mixed anxiety and depressed mood: Secondary | ICD-10-CM | POA: Diagnosis not present

## 2019-01-09 DIAGNOSIS — F4323 Adjustment disorder with mixed anxiety and depressed mood: Secondary | ICD-10-CM | POA: Diagnosis not present

## 2019-01-16 DIAGNOSIS — F4323 Adjustment disorder with mixed anxiety and depressed mood: Secondary | ICD-10-CM | POA: Diagnosis not present

## 2019-01-28 DIAGNOSIS — F4323 Adjustment disorder with mixed anxiety and depressed mood: Secondary | ICD-10-CM | POA: Diagnosis not present

## 2019-02-11 DIAGNOSIS — F4323 Adjustment disorder with mixed anxiety and depressed mood: Secondary | ICD-10-CM | POA: Diagnosis not present

## 2019-02-25 DIAGNOSIS — F4323 Adjustment disorder with mixed anxiety and depressed mood: Secondary | ICD-10-CM | POA: Diagnosis not present

## 2019-03-11 DIAGNOSIS — F4323 Adjustment disorder with mixed anxiety and depressed mood: Secondary | ICD-10-CM | POA: Diagnosis not present

## 2019-03-25 DIAGNOSIS — F4323 Adjustment disorder with mixed anxiety and depressed mood: Secondary | ICD-10-CM | POA: Diagnosis not present

## 2019-04-08 DIAGNOSIS — F4323 Adjustment disorder with mixed anxiety and depressed mood: Secondary | ICD-10-CM | POA: Diagnosis not present

## 2019-05-02 DIAGNOSIS — Z20828 Contact with and (suspected) exposure to other viral communicable diseases: Secondary | ICD-10-CM | POA: Diagnosis not present

## 2019-05-11 DIAGNOSIS — Z1231 Encounter for screening mammogram for malignant neoplasm of breast: Secondary | ICD-10-CM | POA: Diagnosis not present

## 2019-05-11 LAB — HM MAMMOGRAPHY

## 2019-05-14 ENCOUNTER — Encounter: Payer: Self-pay | Admitting: Internal Medicine

## 2019-05-18 DIAGNOSIS — F4323 Adjustment disorder with mixed anxiety and depressed mood: Secondary | ICD-10-CM | POA: Diagnosis not present

## 2019-06-02 ENCOUNTER — Encounter: Payer: Self-pay | Admitting: Internal Medicine

## 2019-06-02 ENCOUNTER — Other Ambulatory Visit: Payer: Self-pay

## 2019-06-02 ENCOUNTER — Ambulatory Visit (INDEPENDENT_AMBULATORY_CARE_PROVIDER_SITE_OTHER): Payer: BC Managed Care – PPO | Admitting: Internal Medicine

## 2019-06-02 VITALS — BP 120/60 | HR 76 | Temp 97.6°F | Ht 61.0 in | Wt 144.0 lb

## 2019-06-02 DIAGNOSIS — Z86711 Personal history of pulmonary embolism: Secondary | ICD-10-CM | POA: Diagnosis not present

## 2019-06-02 DIAGNOSIS — Z0000 Encounter for general adult medical examination without abnormal findings: Secondary | ICD-10-CM

## 2019-06-02 NOTE — Progress Notes (Signed)
This visit occurred during the SARS-CoV-2 public health emergency.  Safety protocols were in place, including screening questions prior to the visit, additional usage of staff PPE, and extensive cleaning of exam room while observing appropriate contact time as indicated for disinfecting solutions.    Chief Complaint  Patient presents with  . Annual Exam    HPI: Patient  Jessica Dudley  44 y.o. comes in today for [place] visit has wellness form to complete labs and  Health parameters   gets frequent yeast infections  Likes to eat sugars   Please check  Sugar levels  Health Maintenance  Topic Date Due  . TETANUS/TDAP  03/01/2019  . PAP SMEAR-Modifier  10/20/2021  . INFLUENZA VACCINE  Completed  . HIV Screening  Completed   Health Maintenance Review LIFESTYLE:  Exercise:  Tries to walk not a lot  Aware  Tobacco/ETS: no Alcohol:   ocass  Sugar beverages: juice only  Sleep: has toddler .  Drug use: no HH of   3  No pets  Work:  Mostly from home  Once a week i n office  41 - 37   Works for [employer] and has toddler at home   Had tdap with pregnancy  About 2 years ago ?   ROS:   Wrist hands forearms hurts at times  Poss cts but no weakness  Feels lactose intoleracne  partle gvegan gets some gi sx food based but no blood or other  GEN/ HEENT: No fever, significant weight changes sweats headaches vision problems hearing changes, CV/ PULM; No chest pain shortness of breath cough, syncope,edema  change in exercise tolerance. GI /GU: No adominal pain, vomiting, change in bowel habits. No blood in the stool. No significant GU symptoms. SKIN/HEME: ,no acute skin rashes suspicious lesions or bleeding. No lymphadenopathy, nodules, masses.  NEURO/ PSYCH:  No neurologic signs such as weakness numbness. No depression anxiety. IMM/ Allergy: No unusual infections.  Allergy .   REST of 12 system review negative except as per HPI   Past Medical History:  Diagnosis  Date  . Hx of pulmonary embolus 07/23/2017  . In vitro fertilization 07/02/2016   Hematology supervision in view of previous PE 12/11/15  . Iron deficiency anemia   . PCOS (polycystic ovarian syndrome)   . Pregnancy, high-risk 07/23/2017  . Uterine fibroid   . Wears glasses     Past Surgical History:  Procedure Laterality Date  . CESAREAN SECTION WITH BILATERAL TUBAL LIGATION N/A 08/22/2017   Procedure: PRIMARY CESAREAN SECTION WITH BILATERAL TUBAL LIGATION;  Surgeon: Servando Salina, MD;  Location: [facility];  Service: Obstetrics;  Laterality: N/A;  Classical incision EDD: 09/12/17 Allergy: Vicodin  . CHROMOPERTUBATION N/A 11/29/2015   Procedure: ABDOMINAL MYOMECTOMY;  Surgeon: Governor Specking, MD;  Location: [facility];  Service: Gynecology;  Laterality: N/A;  . MYOMECTOMY    . MYOMECTOMY N/A 08/22/2017   Procedure: MYOMECTOMY;  Surgeon: Servando Salina, MD;  Location: [facility];  Service: Obstetrics;  Laterality: N/A;  . NO PAST SURGERIES      Family History  Problem Relation Age of Onset  . Hypertension Mother   . Prostate cancer Paternal Uncle   . Diabetes Maternal Grandmother   . Leukemia Cousin   . Brain cancer Cousin   . Deep vein thrombosis Father   . Varicose Veins Father     Social History   Socioeconomic History  . Marital status: Married    Spouse name:  Not on file  . Number of children: Not on file  . Years of education: Not on file  . Highest education level: Not on file  Occupational History  . Not on file  Tobacco Use  . Smoking status: Never Smoker  . Smokeless tobacco: Never Used  Substance and Sexual Activity  . Alcohol use: No  . Drug use: No  . Sexual activity: Yes    Birth control/protection: None  Other Topics Concern  . 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 Difficulty of Paying Living Expenses: Not on file  Food Insecurity:   . Worried About Charity fundraiser in the Last Year: Not on file  . Ran Out of Food in the Last Year: Not on file  Transportation Needs:   . Lack of Transportation (Medical): Not on file  . Lack of Transportation (Non-Medical): Not on file  Physical Activity:   . Days of Exercise per Week: Not on file  . Minutes of Exercise per Session: Not on file  Stress:   . Feeling of Stress : Not on file  Social Connections:   . Frequency of Communication with Friends and Family: Not on file  . Frequency of Social Gatherings with Friends and Family: Not on file  . Attends Religious Services: Not on file  . Active Member of Clubs or Organizations: Not on file  . Attends Archivist Meetings: Not on file  . Marital Status: Not on file    Outpatient Medications Prior to Visit  Medication Sig Dispense Refill  . ibuprofen (ADVIL) 600 MG tablet Take 1 tablet (600 mg total) by mouth every 8 (eight) hours as needed. 20 tablet 0  . Magnesium Oxide (MAG-OX 400 PO) Take 400 mg by mouth daily.    . valACYclovir (VALTREX) 500 MG tablet Take 1 tablet (500 mg total) by mouth 2 (two) times daily. For 3-5 days for outbreak 30 tablet 5  . vitamin C (ASCORBIC ACID) 500 MG tablet Take 500 mg by mouth daily.    . baclofen (LIORESAL) 20 MG tablet Take 1 tablet (20 mg total) by mouth 3 (three) times daily. (Patient not taking: Reported on 06/02/2019) 20 each 0  . Prenatal Vit-Fe Fumarate-FA (PRENATAL MULTIVITAMIN) TABS tablet Take 1 tablet by mouth daily at 12 noon.     No facility-administered medications prior to visit.     EXAM:  BP 120/60 (BP Location: Right Arm, Patient Position: Sitting, Cuff Size: Normal)   Pulse 76   Temp 97.6 F (36.4 C) (Temporal)   Ht 5\' 1"  (1.549 m)   Wt 144 lb (65.3 kg)   SpO2 98%   BMI  27.21 kg/m   Body mass index is 27.21 kg/m. Wt Readings from Last 3 Encounters:  06/02/19 144 lb (65.3 kg)  12/27/18 145 lb (65.8 kg)  05/26/18 145 lb 3.2 oz (65.9 kg)    Physical Exam: Vital signs reviewed [id number] is a well-developed well-nourished alert cooperative    who appearsr stated age in no acute distress.  HEENT: normocephalic atraumatic , Eyes: PERRL EOM's full, conjunctiva clear, ,  Ears: no deformity EAC's clear TMs with normal landmarks. Mouth:masked NECK: supple without masses, thyromegaly or bruits. CHEST/PULM:  Clear to auscultation and percussion breath sounds equal no wheeze , rales or rhonchi. No chest wall deformities or tenderness. Breast: normal by inspection . No dimpling, discharge, masses, tenderness or discharge . CV: PMI is nondisplaced, S1 S2 no gallops, murmurs, rubs. Peripheral pulses are full without delay.No JVD .  ABDOMEN: Bowel sounds normal nontender  No guard or rebound, no hepato splenomegal no CVA tenderness.  No hernia. Extremtities:  No clubbing cyanosis or edema, no acute joint swelling or redness no focal atrophy no crepitus  Strength grossly nl  NEURO:  Oriented x3, cranial nerves 3-12 appear to be intact, no obvious focal weakness,gait within normal limits no abnormal reflexes or asymmetrical SKIN: No acute rashes normal turgor, color, no bruising or petechiae. PSYCH: Oriented, good eye contact, no obvious depression anxiety, cognition and judgment appear normal. LN: no cervical axillary inguinal adenopathy  Lab Results  Component Value Date   WBC 3.8 (L) 05/26/2018   HGB 12.1 05/26/2018   HCT 38.4 05/26/2018   PLT 231.0 05/26/2018   GLUCOSE 81 05/26/2018   CHOL 158 05/26/2018   TRIG 62.0 05/26/2018   HDL 48.20 05/26/2018   LDLCALC 98 05/26/2018   ALT 11 05/26/2018   AST 11 05/26/2018   NA 139 05/26/2018   K 4.1 05/26/2018   CL 106 05/26/2018   CREATININE 0.99 05/26/2018   BUN 13 05/26/2018   CO2 26 05/26/2018   TSH 0.55  05/26/2018   INR 1.08 12/11/2015    BP Readings from Last 3 Encounters:  06/02/19 120/60  05/26/18 110/68  11/11/17 115/82    Lab plan reviewed with patient   ASSESSMENT AND PLAN:  Discussed the following assessment and plan:    ICD-10-CM   1. Visit for preventive health examination  123456 Basic metabolic panel    CBC with Differential/Platelet    Hemoglobin A1c    Hepatic function panel    Lipid panel    TSH  2. Hx pulmonary embolism  Z86.711    Has had pap and tdap and utd  Hx of pe   Per dr Beryle Beams   No need for  Fu or other precautions after last pregnancy  Disc  colon cancer screening at age 72    Lactose intolerance   Disc   If change bowel habits  Etc  Concerns let us know   Patient Care Team: Reyah Streeter, Standley Brooking, MD as PCP - General Beryle Beams Alyson Locket, MD as Consulting Physician (Oncology) Governor Specking, MD as Consulting Physician (Obstetrics and Gynecology) Servando Salina, MD as Consulting Physician (Obstetrics and Gynecology) Patient Instructions  Can get fasting lab at [facility].   Will send in   Form when results avaialbe .  relative rest with srist and hand .  Ice at end of day . And use topical  voltaren if needed ( nsaid)  Otherwise  cpx in a year .     Health Maintenance, Female Adopting a healthy lifestyle and getting preventive care are important in promoting health and wellness. Ask your health care provider about:  The right schedule for you to have regular tests and exams.  Things you can do on your own to prevent diseases and keep yourself healthy. What should I know about diet, weight, and exercise? Eat a healthy diet   Eat a diet that includes plenty of vegetables, fruits, low-fat dairy products, and lean protein.  Do not eat a lot of foods that are high in solid fats, added sugars, or sodium. Maintain a healthy weight Body mass index (BMI) is used to identify weight problems. It estimates body fat based on height and  weight. Your health care provider can help determine your BMI and help you achieve or maintain a healthy weight. Get regular exercise Get regular exercise. This is one of the most important things you can do for your health. Most adults should:  Exercise for at least 150 minutes each week. The exercise should increase your heart rate and make you sweat (moderate-intensity exercise).  Do strengthening exercises at least twice a week. This is in addition to the moderate-intensity exercise.  Spend less time sitting. Even light physical activity can be beneficial. Watch cholesterol and blood lipids Have your blood tested for lipids and cholesterol at 43 years of age, then have this test every 5 years. Have your cholesterol levels checked more often if:  Your lipid or cholesterol levels are high.  You are older than 43 years of age.  You are at high risk for heart disease. What should I know about cancer screening? Depending on your health history and family history, you may need to have cancer screening at various ages. This may include screening for:  Breast cancer.  Cervical cancer.  Colorectal cancer.  Skin cancer.  Lung cancer. What should I know about heart disease, diabetes, and high blood pressure? Blood pressure and heart disease  High blood pressure causes heart disease and increases the risk of stroke. This is more likely to develop in people who have high blood pressure readings, are of African descent, or are overweight.  Have your blood pressure checked: ? Every 3-5 years if you are 62-84 years of age. ? Every year if you are 3 years old or older. Diabetes Have regular diabetes screenings. This checks your fasting blood sugar level. Have the screening done:  Once every three years after age 70 if you are at a normal weight and have a low risk for diabetes.  More often and at a younger age if you are overweight or have a high risk for diabetes. What should I know  about preventing infection? Hepatitis B If you have a higher risk for hepatitis B, you should be screened for this virus. Talk with your health care provider to find out if you are at risk for hepatitis B infection. Hepatitis C Testing is recommended for:  Everyone born from [birth year] through 1964.  Anyone with known risk factors for hepatitis C. Sexually transmitted infections (STIs)  Get screened for STIs, including gonorrhea and chlamydia, if: ? You are sexually active and are younger than 43 years of age. ? You are older than 43 years of age and your health care provider tells you that you are at risk for this type of infection. ? Your sexual activity has changed since you were last screened, and you are at increased risk for chlamydia or gonorrhea. Ask your health care provider if you are at risk.  Ask your health care provider about whether you are at high risk for HIV. Your health care provider may recommend a prescription medicine to help prevent HIV infection. If you choose to take medicine to prevent HIV, you should first get tested for HIV. You should then be tested every 3 months for as long as you are taking the medicine. Pregnancy  If you are about to stop having your period (premenopausal) and you may  become pregnant, seek counseling before you get pregnant.  Take 400 to 800 micrograms (mcg) of folic acid every day if you become pregnant.  Ask for birth control (contraception) if you want to prevent pregnancy. Osteoporosis and menopause Osteoporosis is a disease in which the bones lose minerals and strength with aging. This can result in bone fractures. If you are 33 years old or older, or if you are at risk for osteoporosis and fractures, ask your health care provider if you should:  Be screened for bone loss.  Take a calcium or vitamin D supplement to lower your risk of fractures.  Be given hormone replacement therapy (HRT) to treat symptoms of menopause. Follow these  instructions at home: Lifestyle  Do not use any products that contain nicotine or tobacco, such as cigarettes, e-cigarettes, and chewing tobacco. If you need help quitting, ask your health care provider.  Do not use street drugs.  Do not share needles.  Ask your health care provider for help if you need support or information about quitting drugs. Alcohol use  Do not drink alcohol if: ? Your health care provider tells you not to drink. ? You are pregnant, may be pregnant, or are planning to become pregnant.  If you drink alcohol: ? Limit how much you use to 0-1 drink a day. ? Limit intake if you are breastfeeding.  Be aware of how much alcohol is in your drink. In the [place] one drink equals one 12 oz bottle of beer (355 mL), one 5 oz glass of wine (148 mL), or one 1 oz glass of hard liquor (44 mL). General instructions  Schedule regular health, dental, and eye exams.  Stay current with your vaccines.  Tell your health care provider if: ? You often feel depressed. ? You have ever been abused or do not feel safe at home. Summary  Adopting a healthy lifestyle and getting preventive care are important in promoting health and wellness.  Follow your health care provider's instructions about healthy diet, exercising, and getting tested or screened for diseases.  Follow your health care provider's instructions on monitoring your cholesterol and blood pressure. This information is not intended to replace advice given to you by your health care provider. Make sure you discuss any questions you have with your health care provider. Document Revised: 04/16/2018 Document Reviewed: 04/16/2018 Elsevier Patient Education  2020 [place] Huxley Shurley M.D.

## 2019-06-02 NOTE — Patient Instructions (Addendum)
Can get fasting lab at [facility].   Will send in   Form when results avaialbe .  relative rest with srist and hand .  Ice at end of day . And use topical  voltaren if needed ( nsaid)  Otherwise  cpx in a year .     Health Maintenance, Female Adopting a healthy lifestyle and getting preventive care are important in promoting health and wellness. Ask your health care provider about:  The right schedule for you to have regular tests and exams.  Things you can do on your own to prevent diseases and keep yourself healthy. What should I know about diet, weight, and exercise? Eat a healthy diet   Eat a diet that includes plenty of vegetables, fruits, low-fat dairy products, and lean protein.  Do not eat a lot of foods that are high in solid fats, added sugars, or sodium. Maintain a healthy weight Body mass index (BMI) is used to identify weight problems. It estimates body fat based on height and weight. Your health care provider can help determine your BMI and help you achieve or maintain a healthy weight. Get regular exercise Get regular exercise. This is one of the most important things you can do for your health. Most adults should:  Exercise for at least 150 minutes each week. The exercise should increase your heart rate and make you sweat (moderate-intensity exercise).  Do strengthening exercises at least twice a week. This is in addition to the moderate-intensity exercise.  Spend less time sitting. Even light physical activity can be beneficial. Watch cholesterol and blood lipids Have your blood tested for lipids and cholesterol at 43 years of age, then have this test every 5 years. Have your cholesterol levels checked more often if:  Your lipid or cholesterol levels are high.  You are older than 43 years of age.  You are at high risk for heart disease. What should I know about cancer screening? Depending on your health history and family history, you may need to have cancer  screening at various ages. This may include screening for:  Breast cancer.  Cervical cancer.  Colorectal cancer.  Skin cancer.  Lung cancer. What should I know about heart disease, diabetes, and high blood pressure? Blood pressure and heart disease  High blood pressure causes heart disease and increases the risk of stroke. This is more likely to develop in people who have high blood pressure readings, are of African descent, or are overweight.  Have your blood pressure checked: ? Every 3-5 years if you are 52-5 years of age. ? Every year if you are 79 years old or older. Diabetes Have regular diabetes screenings. This checks your fasting blood sugar level. Have the screening done:  Once every three years after age 52 if you are at a normal weight and have a low risk for diabetes.  More often and at a younger age if you are overweight or have a high risk for diabetes. What should I know about preventing infection? Hepatitis B If you have a higher risk for hepatitis B, you should be screened for this virus. Talk with your health care provider to find out if you are at risk for hepatitis B infection. Hepatitis C Testing is recommended for:  Everyone born from [birth year] through 1964.  Anyone with known risk factors for hepatitis C. Sexually transmitted infections (STIs)  Get screened for STIs, including gonorrhea and chlamydia, if: ? You are sexually active and are younger than 43  years of age. ? You are older than 43 years of age and your health care provider tells you that you are at risk for this type of infection. ? Your sexual activity has changed since you were last screened, and you are at increased risk for chlamydia or gonorrhea. Ask your health care provider if you are at risk.  Ask your health care provider about whether you are at high risk for HIV. Your health care provider may recommend a prescription medicine to help prevent HIV infection. If you choose to take  medicine to prevent HIV, you should first get tested for HIV. You should then be tested every 3 months for as long as you are taking the medicine. Pregnancy  If you are about to stop having your period (premenopausal) and you may become pregnant, seek counseling before you get pregnant.  Take 400 to 800 micrograms (mcg) of folic acid every day if you become pregnant.  Ask for birth control (contraception) if you want to prevent pregnancy. Osteoporosis and menopause Osteoporosis is a disease in which the bones lose minerals and strength with aging. This can result in bone fractures. If you are 82 years old or older, or if you are at risk for osteoporosis and fractures, ask your health care provider if you should:  Be screened for bone loss.  Take a calcium or vitamin D supplement to lower your risk of fractures.  Be given hormone replacement therapy (HRT) to treat symptoms of menopause. Follow these instructions at home: Lifestyle  Do not use any products that contain nicotine or tobacco, such as cigarettes, e-cigarettes, and chewing tobacco. If you need help quitting, ask your health care provider.  Do not use street drugs.  Do not share needles.  Ask your health care provider for help if you need support or information about quitting drugs. Alcohol use  Do not drink alcohol if: ? Your health care provider tells you not to drink. ? You are pregnant, may be pregnant, or are planning to become pregnant.  If you drink alcohol: ? Limit how much you use to 0-1 drink a day. ? Limit intake if you are breastfeeding.  Be aware of how much alcohol is in your drink. In the [place] one drink equals one 12 oz bottle of beer (355 mL), one 5 oz glass of wine (148 mL), or one 1 oz glass of hard liquor (44 mL). General instructions  Schedule regular health, dental, and eye exams.  Stay current with your vaccines.  Tell your health care provider if: ? You often feel depressed. ? You have  ever been abused or do not feel safe at home. Summary  Adopting a healthy lifestyle and getting preventive care are important in promoting health and wellness.  Follow your health care provider's instructions about healthy diet, exercising, and getting tested or screened for diseases.  Follow your health care provider's instructions on monitoring your cholesterol and blood pressure. This information is not intended to replace advice given to you by your health care provider. Make sure you discuss any questions you have with your health care provider. Document Revised: 04/16/2018 Document Reviewed: 04/16/2018 Elsevier Patient Education  2020 [employer].

## 2019-06-03 ENCOUNTER — Other Ambulatory Visit (INDEPENDENT_AMBULATORY_CARE_PROVIDER_SITE_OTHER): Payer: BC Managed Care – PPO

## 2019-06-03 DIAGNOSIS — Z0000 Encounter for general adult medical examination without abnormal findings: Secondary | ICD-10-CM

## 2019-06-03 LAB — CBC WITH DIFFERENTIAL/PLATELET
Basophils Absolute: 0 10*3/uL (ref 0.0–0.1)
Basophils Relative: 1.1 % (ref 0.0–3.0)
Eosinophils Absolute: 0 10*3/uL (ref 0.0–0.7)
Eosinophils Relative: 0.5 % (ref 0.0–5.0)
HCT: 35.2 % — ABNORMAL LOW (ref 36.0–46.0)
Hemoglobin: 10.9 g/dL — ABNORMAL LOW (ref 12.0–15.0)
Lymphocytes Relative: 37.2 % (ref 12.0–46.0)
Lymphs Abs: 1.2 10*3/uL (ref 0.7–4.0)
MCHC: 30.9 g/dL (ref 30.0–36.0)
MCV: 63.3 fl — ABNORMAL LOW (ref 78.0–100.0)
Monocytes Absolute: 0.3 10*3/uL (ref 0.1–1.0)
Monocytes Relative: 8 % (ref 3.0–12.0)
Neutro Abs: 1.7 10*3/uL (ref 1.4–7.7)
Neutrophils Relative %: 53.2 % (ref 43.0–77.0)
Platelets: 197 10*3/uL (ref 150.0–400.0)
RBC: 5.55 Mil/uL — ABNORMAL HIGH (ref 3.87–5.11)
RDW: 17.4 % — ABNORMAL HIGH (ref 11.5–15.5)
WBC: 3.2 10*3/uL — ABNORMAL LOW (ref 4.0–10.5)

## 2019-06-03 LAB — TSH: TSH: 0.92 u[IU]/mL (ref 0.35–4.50)

## 2019-06-03 LAB — BASIC METABOLIC PANEL
BUN: 13 mg/dL (ref 6–23)
CO2: 24 mEq/L (ref 19–32)
Calcium: 9.6 mg/dL (ref 8.4–10.5)
Chloride: 108 mEq/L (ref 96–112)
Creatinine, Ser: 0.95 mg/dL (ref 0.40–1.20)
GFR: 77.83 mL/min (ref >60.00)
Glucose, Bld: 88 mg/dL (ref 70–99)
Potassium: 3.8 mEq/L (ref 3.5–5.1)
Sodium: 140 mEq/L (ref 135–145)

## 2019-06-03 LAB — HEPATIC FUNCTION PANEL
ALT: 12 U/L (ref 0–35)
AST: 12 U/L (ref 0–37)
Albumin: 4.5 g/dL (ref 3.5–5.2)
Alkaline Phosphatase: 50 U/L (ref 39–117)
Bilirubin, Direct: 0.1 mg/dL (ref 0.0–0.3)
Total Bilirubin: 0.4 mg/dL (ref 0.2–1.2)
Total Protein: 7.2 g/dL (ref 6.0–8.3)

## 2019-06-03 LAB — HEMOGLOBIN A1C: Hgb A1c MFr Bld: 5.5 % (ref 4.6–6.5)

## 2019-06-03 LAB — LIPID PANEL
Cholesterol: 164 mg/dL (ref 0–200)
HDL: 52.9 mg/dL (ref >39.00)
LDL Cholesterol: 102 mg/dL — ABNORMAL HIGH (ref 0–99)
NonHDL: 110.84
Total CHOL/HDL Ratio: 3
Triglycerides: 43 mg/dL (ref 0.0–149.0)
VLDL: 8.6 mg/dL (ref 0.0–40.0)

## 2019-06-08 NOTE — Progress Notes (Signed)
Blood owrk ok except anemia    form has been completed  . Advise  take iron  1-2 per day  every other day at least  may want to recheck anemia cbcdiff in 2 months  either here or with gyne to be sure  not dropping.

## 2019-06-15 DIAGNOSIS — F4323 Adjustment disorder with mixed anxiety and depressed mood: Secondary | ICD-10-CM | POA: Diagnosis not present

## 2019-06-22 DIAGNOSIS — L728 Other follicular cysts of the skin and subcutaneous tissue: Secondary | ICD-10-CM | POA: Diagnosis not present

## 2019-06-22 DIAGNOSIS — L720 Epidermal cyst: Secondary | ICD-10-CM | POA: Diagnosis not present

## 2019-06-23 DIAGNOSIS — N760 Acute vaginitis: Secondary | ICD-10-CM | POA: Diagnosis not present

## 2019-06-23 DIAGNOSIS — R3915 Urgency of urination: Secondary | ICD-10-CM | POA: Diagnosis not present

## 2019-06-24 DIAGNOSIS — R350 Frequency of micturition: Secondary | ICD-10-CM | POA: Diagnosis not present

## 2019-06-24 DIAGNOSIS — D259 Leiomyoma of uterus, unspecified: Secondary | ICD-10-CM | POA: Diagnosis not present

## 2019-06-24 DIAGNOSIS — N83202 Unspecified ovarian cyst, left side: Secondary | ICD-10-CM | POA: Diagnosis not present

## 2019-08-10 DIAGNOSIS — F4323 Adjustment disorder with mixed anxiety and depressed mood: Secondary | ICD-10-CM | POA: Diagnosis not present

## 2019-08-27 DIAGNOSIS — N760 Acute vaginitis: Secondary | ICD-10-CM | POA: Diagnosis not present

## 2019-09-07 DIAGNOSIS — F4323 Adjustment disorder with mixed anxiety and depressed mood: Secondary | ICD-10-CM | POA: Diagnosis not present

## 2019-10-06 DIAGNOSIS — F4323 Adjustment disorder with mixed anxiety and depressed mood: Secondary | ICD-10-CM | POA: Diagnosis not present

## 2019-10-29 DIAGNOSIS — N898 Other specified noninflammatory disorders of vagina: Secondary | ICD-10-CM | POA: Diagnosis not present

## 2019-11-04 DIAGNOSIS — Z113 Encounter for screening for infections with a predominantly sexual mode of transmission: Secondary | ICD-10-CM | POA: Diagnosis not present

## 2019-11-04 DIAGNOSIS — Z124 Encounter for screening for malignant neoplasm of cervix: Secondary | ICD-10-CM | POA: Diagnosis not present

## 2019-11-04 DIAGNOSIS — Z6827 Body mass index (BMI) 27.0-27.9, adult: Secondary | ICD-10-CM | POA: Diagnosis not present

## 2019-11-04 DIAGNOSIS — Z01419 Encounter for gynecological examination (general) (routine) without abnormal findings: Secondary | ICD-10-CM | POA: Diagnosis not present

## 2019-11-04 DIAGNOSIS — Z130 Encounter for screening for diseases of the blood and blood-forming organs and certain disorders involving the immune mechanism: Secondary | ICD-10-CM | POA: Diagnosis not present

## 2019-11-04 DIAGNOSIS — Z1329 Encounter for screening for other suspected endocrine disorder: Secondary | ICD-10-CM | POA: Diagnosis not present

## 2019-11-04 DIAGNOSIS — Z13220 Encounter for screening for lipoid disorders: Secondary | ICD-10-CM | POA: Diagnosis not present

## 2019-11-04 DIAGNOSIS — Z131 Encounter for screening for diabetes mellitus: Secondary | ICD-10-CM | POA: Diagnosis not present

## 2019-11-04 DIAGNOSIS — Z1151 Encounter for screening for human papillomavirus (HPV): Secondary | ICD-10-CM | POA: Diagnosis not present

## 2019-11-04 DIAGNOSIS — Z1159 Encounter for screening for other viral diseases: Secondary | ICD-10-CM | POA: Diagnosis not present

## 2019-11-04 DIAGNOSIS — Z0000 Encounter for general adult medical examination without abnormal findings: Secondary | ICD-10-CM | POA: Diagnosis not present

## 2019-12-01 DIAGNOSIS — J301 Allergic rhinitis due to pollen: Secondary | ICD-10-CM | POA: Diagnosis not present

## 2019-12-01 DIAGNOSIS — L309 Dermatitis, unspecified: Secondary | ICD-10-CM | POA: Diagnosis not present

## 2019-12-01 DIAGNOSIS — J3089 Other allergic rhinitis: Secondary | ICD-10-CM | POA: Diagnosis not present

## 2019-12-03 DIAGNOSIS — L249 Irritant contact dermatitis, unspecified cause: Secondary | ICD-10-CM | POA: Diagnosis not present

## 2020-01-04 DIAGNOSIS — L309 Dermatitis, unspecified: Secondary | ICD-10-CM | POA: Diagnosis not present

## 2020-01-04 DIAGNOSIS — L720 Epidermal cyst: Secondary | ICD-10-CM | POA: Diagnosis not present

## 2020-02-17 DIAGNOSIS — D485 Neoplasm of uncertain behavior of skin: Secondary | ICD-10-CM | POA: Diagnosis not present

## 2020-02-23 ENCOUNTER — Telehealth (INDEPENDENT_AMBULATORY_CARE_PROVIDER_SITE_OTHER): Payer: BC Managed Care – PPO | Admitting: Internal Medicine

## 2020-02-23 ENCOUNTER — Encounter: Payer: Self-pay | Admitting: Internal Medicine

## 2020-02-23 DIAGNOSIS — R0989 Other specified symptoms and signs involving the circulatory and respiratory systems: Secondary | ICD-10-CM

## 2020-02-23 DIAGNOSIS — Z20822 Contact with and (suspected) exposure to covid-19: Secondary | ICD-10-CM | POA: Diagnosis not present

## 2020-02-23 NOTE — Progress Notes (Signed)
\  Virtual Visit via Video Note  I connected with@ on 02/23/20 at 10:00 AM EDT by a video enabled telemedicine application and verified that I am speaking with the correct person using two identifiers. Location patient: home Location provider:work  office Persons participating in the virtual visit: patient, provider  WIth national recommendations  regarding COVID 19 pandemic   video visit is advised over in office visit for this patient.  Patient aware  of the limitations of evaluation and management by telemedicine and  availability of in person appointments. and agreed to proceed.   HPI: Jessica Dudley presents for video visit Has had recent tender lymph glands and not feeling well but without fever or cough in the last 1 to 2 weeks.  Aleve helps the symptoms.  She denies any sore throat. She has a past history long time ago of similar tender glands that would come and go.  No diagnosed history of mono but was felt to have had it in the past. This however is new recently. 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 No systemic lumps cough other symptoms. Has been immunized with the [place] Covid vaccine no specific exposure no recent testing.  Thought she would ask advice because of the symptoms going on for 2 weeks.  ROS: See pertinent positives and negatives per HPI.  No fever or night sweats.  Past Medical History:  Diagnosis Date  . Hx of pulmonary embolus 07/23/2017  . In vitro fertilization 07/02/2016   Hematology supervision in view of previous PE 12/11/15  . Iron deficiency anemia   . PCOS (polycystic ovarian syndrome)   . Pregnancy, high-risk 07/23/2017  . Uterine fibroid   . Wears glasses     Past Surgical History:  Procedure Laterality Date  . CESAREAN SECTION WITH BILATERAL TUBAL LIGATION N/A 08/22/2017   Procedure: PRIMARY CESAREAN SECTION WITH BILATERAL TUBAL LIGATION;  Surgeon:  Servando Salina, MD;  Location: [facility];  Service: Obstetrics;  Laterality: N/A;  Classical incision EDD: 09/12/17 Allergy: Vicodin  . CHROMOPERTUBATION N/A 11/29/2015   Procedure: ABDOMINAL MYOMECTOMY;  Surgeon: Governor Specking, MD;  Location: [facility];  Service: Gynecology;  Laterality: N/A;  . MYOMECTOMY    . MYOMECTOMY N/A 08/22/2017   Procedure: MYOMECTOMY;  Surgeon: Servando Salina, MD;  Location: [facility];  Service: Obstetrics;  Laterality: N/A;  . NO PAST SURGERIES      Family History  Problem Relation Age of Onset  . Hypertension Mother   . Prostate cancer Paternal Uncle   . Diabetes Maternal Grandmother   . Leukemia Cousin   . Brain cancer Cousin   . Deep vein thrombosis Father   . Varicose Veins Father     Social History   Tobacco Use  . Smoking status: Never Smoker  . Smokeless tobacco: Never Used  Substance Use Topics  . Alcohol use: No  . Drug use: No      Current Outpatient Medications:  .  Cholecalciferol (VITAMIN D3) 10 MCG (400 UNIT) tablet, Take by mouth., Disp: , Rfl:  .  fluticasone (FLONASE) 50 MCG/ACT nasal spray, Place into both nostrils., Disp: , Rfl:  .  ibuprofen (ADVIL) 600 MG tablet, Take 1 tablet (600 mg total) by mouth every 8 (eight) hours as needed., Disp: 20 tablet, Rfl: 0 .  Magnesium Oxide (MAG-OX 400 PO), Take 400 mg by mouth daily., Disp: , Rfl:  .  triamcinolone ointment (KENALOG) 0.1 %, Apply topically., Disp: , Rfl:  .  valACYclovir (VALTREX) 500 MG tablet, Take 1 tablet (500 mg total) by mouth 2 (two) times daily. For 3-5 days for outbreak, Disp: 30 tablet, Rfl: 5 .  vitamin C (ASCORBIC ACID) 500 MG tablet, Take 500 mg by mouth daily., Disp: , Rfl:   EXAM: BP Readings from Last 3 Encounters:  06/02/19 120/60  05/26/18 110/68  11/11/17 115/82    VITALS per patient if applicable: 03.7   GENERAL: alert, oriented, appears well and in no acute distress  HEENT: atraumatic, conjunttiva  clear, no obvious abnormalities on inspection of external nose and ears  NECK: normal movements of the head and neck points to anterior neck no obvious swelling on video.  LUNGS: on inspection no signs of respiratory distress, breathing rate appears normal, no obvious gross SOB, gasping or wheezing  CV: no obvious cyanosis  MS: moves all visible extremities without noticeable abnormality  PSYCH/NEURO: pleasant and cooperative, no obvious depression or anxiety, speech and thought processing grossly intact Lab Results  Component Value Date   WBC 3.2 (L) 06/03/2019   HGB 10.9 (L) 06/03/2019   HCT 35.2 (L) 06/03/2019   PLT 197.0 06/03/2019   GLUCOSE 88 06/03/2019   CHOL 164 06/03/2019   TRIG 43.0 06/03/2019   HDL 52.90 06/03/2019   LDLCALC 102 (H) 06/03/2019   ALT 12 06/03/2019   AST 12 06/03/2019   NA 140 06/03/2019   K 3.8 06/03/2019   CL 108 06/03/2019   CREATININE 0.95 06/03/2019   BUN 13 06/03/2019   CO2 24 06/03/2019   TSH 0.92 06/03/2019   INR 1.08 12/11/2015   HGBA1C 5.5 06/03/2019    ASSESSMENT AND PLAN:  Discussed the following assessment and plan:    ICD-10-CM   1. Tenderness of lymph node anterior cervical   R09.89    Get covid testing  And then update [place] how doing consider getting strep  Test cbc mono etc testing  If on going  Of note in January she did have anemia and borderline low WBC.  Counseled.   Expectant management and discussion of plan and treatment with opportunity to ask questions and all were answered. The patient agreed with the plan and demonstrated an understanding of the instructions.   Advised to call back or seek an in-person evaluation if worsening  or having  further concerns . Return if symptoms worsen or fail to improve, for status report after covid testing  and go from there  poss lab and other  fu .  Shanon Ace, MD

## 2020-02-24 ENCOUNTER — Telehealth: Payer: Self-pay | Admitting: *Deleted

## 2020-02-24 DIAGNOSIS — R0989 Other specified symptoms and signs involving the circulatory and respiratory systems: Secondary | ICD-10-CM

## 2020-02-24 NOTE — Telephone Encounter (Signed)
Patient called stating she had a virtual with Dr Regis Bill and if her covid test came back negative Dr Regis Bill wanted her to have labs done. I do not see a order for labs. I made patient aware I will get a message to Dr Regis Bill about the labs

## 2020-02-24 NOTE — Telephone Encounter (Signed)
I have placed orders for blood owrk and strep culture  Please get her appt to get this done today or tomorrow   Glad her covid test is negative

## 2020-02-25 ENCOUNTER — Other Ambulatory Visit: Payer: Self-pay

## 2020-02-25 ENCOUNTER — Other Ambulatory Visit (INDEPENDENT_AMBULATORY_CARE_PROVIDER_SITE_OTHER): Payer: BC Managed Care – PPO

## 2020-02-25 DIAGNOSIS — R0989 Other specified symptoms and signs involving the circulatory and respiratory systems: Secondary | ICD-10-CM | POA: Diagnosis not present

## 2020-02-25 NOTE — Telephone Encounter (Signed)
Patient scheduled for today at 3:20 PM

## 2020-02-26 LAB — CBC WITH DIFFERENTIAL/PLATELET
Absolute Monocytes: 345 cells/uL (ref 200–950)
Basophils Absolute: 51 cells/uL (ref 0–200)
Basophils Relative: 1.1 %
Eosinophils Absolute: 32 cells/uL (ref 15–500)
Eosinophils Relative: 0.7 %
HCT: 36.9 % (ref 35.0–45.0)
Hemoglobin: 10.9 g/dL — ABNORMAL LOW (ref 11.7–15.5)
Lymphs Abs: 1596 cells/uL (ref 850–3900)
MCH: 20.5 pg — ABNORMAL LOW (ref 27.0–33.0)
MCHC: 29.5 g/dL — ABNORMAL LOW (ref 32.0–36.0)
MCV: 69.2 fL — ABNORMAL LOW (ref 80.0–100.0)
MPV: 11 fL (ref 7.5–12.5)
Monocytes Relative: 7.5 %
Neutro Abs: 2576 cells/uL (ref 1500–7800)
Neutrophils Relative %: 56 %
Platelets: 255 10*3/uL (ref 140–400)
RBC: 5.33 10*6/uL — ABNORMAL HIGH (ref 3.80–5.10)
RDW: 16.7 % — ABNORMAL HIGH (ref 11.0–15.0)
Total Lymphocyte: 34.7 %
WBC: 4.6 10*3/uL (ref 3.8–10.8)

## 2020-02-26 LAB — COMPREHENSIVE METABOLIC PANEL
AG Ratio: 1.6 (calc) (ref 1.0–2.5)
ALT: 12 U/L (ref 6–29)
AST: 12 U/L (ref 10–30)
Albumin: 4.3 g/dL (ref 3.6–5.1)
Alkaline phosphatase (APISO): 46 U/L (ref 31–125)
BUN: 13 mg/dL (ref 7–25)
CO2: 24 mmol/L (ref 20–32)
Calcium: 9.4 mg/dL (ref 8.6–10.2)
Chloride: 106 mmol/L (ref 98–110)
Creat: 0.91 mg/dL (ref 0.50–1.10)
Globulin: 2.7 g/dL (calc) (ref 1.9–3.7)
Glucose, Bld: 101 mg/dL — ABNORMAL HIGH (ref 65–99)
Potassium: 3.8 mmol/L (ref 3.5–5.3)
Sodium: 140 mmol/L (ref 135–146)
Total Bilirubin: 0.5 mg/dL (ref 0.2–1.2)
Total Protein: 7 g/dL (ref 6.1–8.1)

## 2020-02-26 LAB — C-REACTIVE PROTEIN: CRP: 0.4 mg/L (ref <8.0)

## 2020-02-26 LAB — CMV ABS, IGG+IGM (CYTOMEGALOVIRUS)
CMV IgM: <30 AU/mL
Cytomegalovirus Ab-IgG: 3.9 U/mL — ABNORMAL HIGH

## 2020-02-26 LAB — CBC MORPHOLOGY

## 2020-02-26 LAB — EPSTEIN-BARR VIRUS VCA ANTIBODY PANEL
EBV NA IgG: 304 U/mL — ABNORMAL HIGH
EBV VCA IgG: >750 U/mL — ABNORMAL HIGH
EBV VCA IgM: <36 U/mL

## 2020-02-26 LAB — HIV ANTIBODY (ROUTINE TESTING W REFLEX): HIV 1&2 Ab, 4th Generation: NONREACTIVE

## 2020-02-26 NOTE — Progress Notes (Signed)
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   (Couldn't find the reasons for your anemia in record ?  Could be iron deficiency and hereditary condition  )  suggest we get an in person visit  after   strep   test back.

## 2020-02-27 LAB — CULTURE, GROUP A STREP
MICRO NUMBER:: 11101694
SPECIMEN QUALITY:: ADEQUATE

## 2020-02-29 NOTE — Progress Notes (Signed)
No group a strep .   If on going  concern we need in office visit    but can follow

## 2020-04-19 DIAGNOSIS — F4323 Adjustment disorder with mixed anxiety and depressed mood: Secondary | ICD-10-CM | POA: Diagnosis not present

## 2020-04-26 DIAGNOSIS — N898 Other specified noninflammatory disorders of vagina: Secondary | ICD-10-CM | POA: Diagnosis not present

## 2020-04-28 ENCOUNTER — Encounter: Payer: Self-pay | Admitting: Internal Medicine

## 2020-05-02 DIAGNOSIS — F4323 Adjustment disorder with mixed anxiety and depressed mood: Secondary | ICD-10-CM | POA: Diagnosis not present

## 2020-06-01 DIAGNOSIS — Z1231 Encounter for screening mammogram for malignant neoplasm of breast: Secondary | ICD-10-CM | POA: Diagnosis not present

## 2020-06-03 ENCOUNTER — Encounter: Payer: BC Managed Care – PPO | Admitting: Internal Medicine

## 2020-06-05 NOTE — Progress Notes (Signed)
Chief Complaint  Patient presents with  . Annual Exam    HPI: Patient  Jessica Dudley  45 y.o. comes in today for [place] visit  Has a form for health insurance [employer]. No change in health since last visit she does see Dr. Garwin Brothers has some iron deficiency anemia takes iron 3+ days a week limited to avoid GI side effects. Iron infusion was recommended but too expensive with her insurance.  She has fibroids some removed. In regard to last visit she gets tenderness or swelling in the neck off and on but goes away. Basically is a vegetarian thinks some days that she could be gluten intolerant when eats a lot of bread and pasta gets GI upset.  But no GI bleeding reported.  Health Maintenance  Topic Date Due  . Hepatitis C Screening  Never done  . TETANUS/TDAP  03/01/2019  . COVID-19 Vaccine (3 - Booster for Pfizer series) 03/07/2020  . PAP SMEAR-Modifier  10/20/2021  . INFLUENZA VACCINE  Completed  . HIV Screening  Completed   Health Maintenance Review LIFESTYLE:  Exercise:   Horrible right now.  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   ROS: See HPI she has had allergy testing in the last years. GEN/ HEENT: No fever, significant weight changes sweats headaches vision problems hearing changes, CV/ PULM; No chest pain shortness of breath cough, syncope,edema  change in exercise tolerance. GI /GU: No adominal pain, vomiting, change in bowel habits. No blood in the stool. No significant GU symptoms. SKIN/HEME: ,no acute skin rashes suspicious lesions or bleeding. No lymphadenopathy, nodules, masses.  NEURO/ PSYCH:  No neurologic signs such as weakness numbness. No depression anxiety. IMM/ Allergy: No unusual infections.  Allergy .   REST of 12 system review negative except as per HPI   Past  Medical History:  Diagnosis Date  . Hx of pulmonary embolus 07/23/2017  . In vitro fertilization 07/02/2016   Hematology supervision in view of previous PE 12/11/15  . Iron deficiency anemia   . PCOS (polycystic ovarian syndrome)   . Pregnancy, high-risk 07/23/2017  . Uterine fibroid   . Wears glasses     Past Surgical History:  Procedure Laterality Date  . CESAREAN SECTION WITH BILATERAL TUBAL LIGATION N/A 08/22/2017   Procedure: PRIMARY CESAREAN SECTION WITH BILATERAL TUBAL LIGATION;  Surgeon: Servando Salina, MD;  Location: [facility];  Service: Obstetrics;  Laterality: N/A;  Classical incision EDD: 09/12/17 Allergy: Vicodin  . CHROMOPERTUBATION N/A 11/29/2015   Procedure: ABDOMINAL MYOMECTOMY;  Surgeon: Governor Specking, MD;  Location: [facility];  Service: Gynecology;  Laterality: N/A;  . MYOMECTOMY    . MYOMECTOMY N/A 08/22/2017   Procedure: MYOMECTOMY;  Surgeon: Servando Salina, MD;  Location: [facility];  Service: Obstetrics;  Laterality: N/A;  . NO PAST SURGERIES      Family History  Problem Relation Age of Onset  . Hypertension Mother   . Prostate cancer Paternal Uncle   . Diabetes Maternal Grandmother   . Leukemia Cousin   . Brain cancer Cousin   . Deep vein thrombosis Father   . Varicose Veins Father   Maudry Diego passed away of colon cancer uncertain age not in first-degree  relative.  Social History   Socioeconomic History  . Marital status: Married    Spouse name: Not on file  . Number of children: Not on file  . Years of education: Not on file  . Highest education level: Not on file  Occupational History  . Not on file  Tobacco Use  . Smoking status: Never Smoker  . Smokeless tobacco: Never Used  Substance and Sexual Activity  . Alcohol use: No  . Drug use: No  . Sexual activity: Yes    Birth control/protection: None  Other Topics Concern  . Not on file  Social History Narrative   Divorced  Remarried   In vitro to  deliver in April   On lovenox      Regular exercise- no   HH of 3 with daughter no pets       daughter    Works [employer]   40 hours    MOM [place]   Had 49 yo at home goes to day care 2022   Social Determinants of Health   Financial Resource Strain: Not on file  Food Insecurity: Not on file  Transportation Needs: Not on file  Physical Activity: Not on file  Stress: Not on file  Social Connections: Not on file    Outpatient Medications Prior to Visit  Medication Sig Dispense Refill  . fluticasone (FLONASE) 50 MCG/ACT nasal spray Place into both nostrils.    Marland Kitchen ibuprofen (ADVIL) 600 MG tablet Take 1 tablet (600 mg total) by mouth every 8 (eight) hours as needed. 20 tablet 0  . Magnesium Oxide (MAG-OX 400 PO) Take 400 mg by mouth daily.    . valACYclovir (VALTREX) 500 MG tablet Take 1 tablet (500 mg total) by mouth 2 (two) times daily. For 3-5 days for outbreak 30 tablet 5  . vitamin C (ASCORBIC ACID) 500 MG tablet Take 500 mg by mouth daily.    . Cholecalciferol (VITAMIN D3) 10 MCG (400 UNIT) tablet Take by mouth.    . triamcinolone ointment (KENALOG) 0.1 % Apply topically. (Patient not taking: Reported on 06/06/2020)     No facility-administered medications prior to visit.     EXAM:  BP 110/70 (BP Location: Right Arm, Patient Position: Sitting, Cuff Size: Normal)   Pulse 85   Temp 98.5 F (36.9 C) (Oral)   Ht 5\' 2"  (1.575 m)   Wt 153 lb 3.2 oz (69.5 kg)   SpO2 99%   BMI 28.02 kg/m   Body mass index is 28.02 kg/m. Wt Readings from Last 3 Encounters:  06/06/20 153 lb 3.2 oz (69.5 kg)  06/02/19 144 lb (65.3 kg)  12/27/18 145 lb (65.8 kg)    Physical Exam: Vital signs reviewed [id number] is a well-developed well-nourished alert cooperative    who appearsr stated age in no acute distress.  HEENT: normocephalic atraumatic , Eyes: PERRL EOM's full, conjunctiva clear, Nares: paten,t no deformity discharge or tenderness., Ears: no deformity EAC's clear TMs with normal  landmarks. Mouth: Masked NECK: supple without masses, or bruits. CHEST/PULM:  Clear to auscultation and percussion breath sounds equal no wheeze , rales or rhonchi. No chest wall deformities or tenderness. Breast: normal by inspection . No dimpling, discharge, masses, tenderness or discharge . CV: PMI is nondisplaced, S1 S2 no gallops, murmurs, rubs. Peripheral pulses are full without delay.No JVD .  ABDOMEN: Bowel sounds normal nontender  No guard or rebound, no hepato splenomegal no CVA tenderness.  No hernia. Extremtities:  No clubbing cyanosis or edema,  no acute joint swelling or redness no focal atrophy NEURO:  Oriented x3, cranial nerves 3-12 appear to be intact, no obvious focal weakness,gait within normal limits no abnormal reflexes or asymmetrical SKIN: No acute rashes normal turgor, color, no bruising or petechiae. PSYCH: Oriented, good eye contact, no obvious depression anxiety, cognition and judgment appear normal. LN: no cervical axillary inguinal adenopathy  Lab Results  Component Value Date   WBC 4.6 02/25/2020   HGB 10.9 (L) 02/25/2020   HCT 36.9 02/25/2020   PLT 255 02/25/2020   GLUCOSE 101 (H) 02/25/2020   CHOL 164 06/03/2019   TRIG 43.0 06/03/2019   HDL 52.90 06/03/2019   LDLCALC 102 (H) 06/03/2019   ALT 12 02/25/2020   AST 12 02/25/2020   NA 140 02/25/2020   K 3.8 02/25/2020   CL 106 02/25/2020   CREATININE 0.91 02/25/2020   BUN 13 02/25/2020   CO2 24 02/25/2020   TSH 0.92 06/03/2019   INR 1.08 12/11/2015   HGBA1C 5.5 06/03/2019    BP Readings from Last 3 Encounters:  06/06/20 110/70  06/02/19 120/60  05/26/18 110/68    Lab plan  reviewed with patient   ASSESSMENT AND PLAN:  Discussed the following assessment and plan:    ICD-10-CM   1. Visit for preventive health examination  Z00.00 CBC with Differential/Platelet    Lipid panel    Celiac Disease Comprehensive Panel with Reflexes    Iron, TIBC and Ferritin Panel    Basic metabolic panel     Celiac Disease Comprehensive Panel with Reflexes    Lipid panel    CBC with Differential/Platelet    Iron, TIBC and Ferritin Panel    Basic metabolic panel  2. Hx pulmonary embolism  Z86.711 CBC with Differential/Platelet    Lipid panel    Celiac Disease Comprehensive Panel with Reflexes    Iron, TIBC and Ferritin Panel    Basic metabolic panel    Celiac Disease Comprehensive Panel with Reflexes    Lipid panel    CBC with Differential/Platelet    Iron, TIBC and Ferritin Panel    Basic metabolic panel  3. Anemia, unspecified type  D64.9 CBC with Differential/Platelet    Lipid panel    Celiac Disease Comprehensive Panel with Reflexes    Iron, TIBC and Ferritin Panel    Basic metabolic panel    Celiac Disease Comprehensive Panel with Reflexes    Lipid panel    CBC with Differential/Platelet    Iron, TIBC and Ferritin Panel    Basic metabolic panel    Lab anemia assumed iron deficiency we will do lab panel today doubt celiac disease but will do panel for rule out today because of her iron deficiency anemia. Updated lab work will complete formal results back to be faxed in. She should be up-to-date on Tdap because her child is 7 years old and had updated at the end of pregnancy.  Return in about 1 year (around 06/06/2021) for depending on results.  Patient Care Team: Letonya Mangels, Standley Brooking, MD as PCP - General Beryle Beams Alyson Locket, MD as Consulting Physician (Oncology) Governor Specking, MD as Consulting Physician (Obstetrics and Gynecology) Servando Salina, MD as Consulting Physician (Obstetrics and Gynecology) Patient Instructions   Will notify you  of labs when available. And paperwprk.  Continue lifestyle intervention healthy eating and exercise .  Checking iron level also  Advise  Colon cancer screening age 89    Health Maintenance, Female Adopting a healthy lifestyle and getting preventive care  are important in promoting health and wellness. Ask your health care provider  about:  The right schedule for you to have regular tests and exams.  Things you can do on your own to prevent diseases and keep yourself healthy. What should I know about diet, weight, and exercise? Eat a healthy diet  Eat a diet that includes plenty of vegetables, fruits, low-fat dairy products, and lean protein.  Do not eat a lot of foods that are high in solid fats, added sugars, or sodium.   Maintain a healthy weight Body mass index (BMI) is used to identify weight problems. It estimates body fat based on height and weight. Your health care provider can help determine your BMI and help you achieve or maintain a healthy weight. Get regular exercise Get regular exercise. This is one of the most important things you can do for your health. Most adults should:  Exercise for at least 150 minutes each week. The exercise should increase your heart rate and make you sweat (moderate-intensity exercise).  Do strengthening exercises at least twice a week. This is in addition to the moderate-intensity exercise.  Spend less time sitting. Even light physical activity can be beneficial. Watch cholesterol and blood lipids Have your blood tested for lipids and cholesterol at 44 years of age, then have this test every 5 years. Have your cholesterol levels checked more often if:  Your lipid or cholesterol levels are high.  You are older than 44 years of age.  You are at high risk for heart disease. What should I know about cancer screening? Depending on your health history and family history, you may need to have cancer screening at various ages. This may include screening for:  Breast cancer.  Cervical cancer.  Colorectal cancer.  Skin cancer.  Lung cancer. What should I know about heart disease, diabetes, and high blood pressure? Blood pressure and heart disease  High blood pressure causes heart disease and increases the risk of stroke. This is more likely to develop in people who  have high blood pressure readings, are of African descent, or are overweight.  Have your blood pressure checked: ? Every 3-5 years if you are 23-68 years of age. ? Every year if you are 57 years old or older. Diabetes Have regular diabetes screenings. This checks your fasting blood sugar level. Have the screening done:  Once every three years after age 39 if you are at a normal weight and have a low risk for diabetes.  More often and at a younger age if you are overweight or have a high risk for diabetes. What should I know about preventing infection? Hepatitis B If you have a higher risk for hepatitis B, you should be screened for this virus. Talk with your health care provider to find out if you are at risk for hepatitis B infection. Hepatitis C Testing is recommended for:  Everyone born from [birth year] through 1964.  Anyone with known risk factors for hepatitis C. Sexually transmitted infections (STIs)  Get screened for STIs, including gonorrhea and chlamydia, if: ? You are sexually active and are younger than 44 years of age. ? You are older than 44 years of age and your health care provider tells you that you are at risk for this type of infection. ? Your sexual activity has changed since you were last screened, and you are at increased risk for chlamydia or gonorrhea. Ask your health care provider if you are at risk.  Ask your health care  provider about whether you are at high risk for HIV. Your health care provider may recommend a prescription medicine to help prevent HIV infection. If you choose to take medicine to prevent HIV, you should first get tested for HIV. You should then be tested every 3 months for as long as you are taking the medicine. Pregnancy  If you are about to stop having your period (premenopausal) and you may become pregnant, seek counseling before you get pregnant.  Take 400 to 800 micrograms (mcg) of folic acid every day if you become pregnant.  Ask for birth  control (contraception) if you want to prevent pregnancy. Osteoporosis and menopause Osteoporosis is a disease in which the bones lose minerals and strength with aging. This can result in bone fractures. If you are 2 years old or older, or if you are at risk for osteoporosis and fractures, ask your health care provider if you should:  Be screened for bone loss.  Take a calcium or vitamin D supplement to lower your risk of fractures.  Be given hormone replacement therapy (HRT) to treat symptoms of menopause. Follow these instructions at home: Lifestyle  Do not use any products that contain nicotine or tobacco, such as cigarettes, e-cigarettes, and chewing tobacco. If you need help quitting, ask your health care provider.  Do not use street drugs.  Do not share needles.  Ask your health care provider for help if you need support or information about quitting drugs. Alcohol use  Do not drink alcohol if: ? Your health care provider tells you not to drink. ? You are pregnant, may be pregnant, or are planning to become pregnant.  If you drink alcohol: ? Limit how much you use to 0-1 drink a day. ? Limit intake if you are breastfeeding.  Be aware of how much alcohol is in your drink. In the [place] one drink equals one 12 oz bottle of beer (355 mL), one 5 oz glass of wine (148 mL), or one 1 oz glass of hard liquor (44 mL). General instructions  Schedule regular health, dental, and eye exams.  Stay current with your vaccines.  Tell your health care provider if: ? You often feel depressed. ? You have ever been abused or do not feel safe at home. Summary  Adopting a healthy lifestyle and getting preventive care are important in promoting health and wellness.  Follow your health care provider's instructions about healthy diet, exercising, and getting tested or screened for diseases.  Follow your health care provider's instructions on monitoring your cholesterol and blood  pressure. This information is not intended to replace advice given to you by your health care provider. Make sure you discuss any questions you have with your health care provider. Document Revised: 04/16/2018 Document Reviewed: 04/16/2018 Elsevier Patient Education  2021 [place]. Osei Anger M.D.

## 2020-06-06 ENCOUNTER — Other Ambulatory Visit: Payer: Self-pay

## 2020-06-06 ENCOUNTER — Encounter: Payer: Self-pay | Admitting: Internal Medicine

## 2020-06-06 ENCOUNTER — Ambulatory Visit (INDEPENDENT_AMBULATORY_CARE_PROVIDER_SITE_OTHER): Payer: BC Managed Care – PPO | Admitting: Internal Medicine

## 2020-06-06 VITALS — BP 110/70 | HR 85 | Temp 98.5°F | Ht 62.0 in | Wt 153.2 lb

## 2020-06-06 DIAGNOSIS — D649 Anemia, unspecified: Secondary | ICD-10-CM

## 2020-06-06 DIAGNOSIS — Z86711 Personal history of pulmonary embolism: Secondary | ICD-10-CM

## 2020-06-06 DIAGNOSIS — Z0000 Encounter for general adult medical examination without abnormal findings: Secondary | ICD-10-CM

## 2020-06-06 LAB — BASIC METABOLIC PANEL
BUN: 16 mg/dL (ref 6–23)
CO2: 28 mEq/L (ref 19–32)
Calcium: 9.5 mg/dL (ref 8.4–10.5)
Chloride: 107 mEq/L (ref 96–112)
Creatinine, Ser: 0.87 mg/dL (ref 0.40–1.20)
GFR: 81.48 mL/min (ref >60.00)
Glucose, Bld: 81 mg/dL (ref 70–99)
Potassium: 4 mEq/L (ref 3.5–5.1)
Sodium: 141 mEq/L (ref 135–145)

## 2020-06-06 LAB — CBC WITH DIFFERENTIAL/PLATELET
Basophils Absolute: 0 10*3/uL (ref 0.0–0.1)
Basophils Relative: 0.9 % (ref 0.0–3.0)
Eosinophils Absolute: 0 10*3/uL (ref 0.0–0.7)
Eosinophils Relative: 1 % (ref 0.0–5.0)
HCT: 34.9 % — ABNORMAL LOW (ref 36.0–46.0)
Hemoglobin: 11 g/dL — ABNORMAL LOW (ref 12.0–15.0)
Lymphocytes Relative: 36.1 % (ref 12.0–46.0)
Lymphs Abs: 1.6 10*3/uL (ref 0.7–4.0)
MCHC: 31.5 g/dL (ref 30.0–36.0)
MCV: 66.8 fl — ABNORMAL LOW (ref 78.0–100.0)
Monocytes Absolute: 0.4 10*3/uL (ref 0.1–1.0)
Monocytes Relative: 8.3 % (ref 3.0–12.0)
Neutro Abs: 2.3 10*3/uL (ref 1.4–7.7)
Neutrophils Relative %: 53.7 % (ref 43.0–77.0)
Platelets: 213 10*3/uL (ref 150.0–400.0)
RBC: 5.22 Mil/uL — ABNORMAL HIGH (ref 3.87–5.11)
RDW: 16.1 % — ABNORMAL HIGH (ref 11.5–15.5)
WBC: 4.3 10*3/uL (ref 4.0–10.5)

## 2020-06-06 LAB — LIPID PANEL
Cholesterol: 164 mg/dL (ref 0–200)
HDL: 56.5 mg/dL (ref >39.00)
LDL Cholesterol: 99 mg/dL (ref 0–99)
NonHDL: 107.22
Total CHOL/HDL Ratio: 3
Triglycerides: 43 mg/dL (ref 0.0–149.0)
VLDL: 8.6 mg/dL (ref 0.0–40.0)

## 2020-06-06 NOTE — Patient Instructions (Signed)
Will notify you  of labs when available. And paperwprk.  Continue lifestyle intervention healthy eating and exercise .  Checking iron level also  Advise  Colon cancer screening age 44    Health Maintenance, Female Adopting a healthy lifestyle and getting preventive care are important in promoting health and wellness. Ask your health care provider about:  The right schedule for you to have regular tests and exams.  Things you can do on your own to prevent diseases and keep yourself healthy. What should I know about diet, weight, and exercise? Eat a healthy diet  Eat a diet that includes plenty of vegetables, fruits, low-fat dairy products, and lean protein.  Do not eat a lot of foods that are high in solid fats, added sugars, or sodium.   Maintain a healthy weight Body mass index (BMI) is used to identify weight problems. It estimates body fat based on height and weight. Your health care provider can help determine your BMI and help you achieve or maintain a healthy weight. Get regular exercise Get regular exercise. This is one of the most important things you can do for your health. Most adults should:  Exercise for at least 150 minutes each week. The exercise should increase your heart rate and make you sweat (moderate-intensity exercise).  Do strengthening exercises at least twice a week. This is in addition to the moderate-intensity exercise.  Spend less time sitting. Even light physical activity can be beneficial. Watch cholesterol and blood lipids Have your blood tested for lipids and cholesterol at 44 years of age, then have this test every 5 years. Have your cholesterol levels checked more often if:  Your lipid or cholesterol levels are high.  You are older than 44 years of age.  You are at high risk for heart disease. What should I know about cancer screening? Depending on your health history and family history, you may need to have cancer screening at various ages.  This may include screening for:  Breast cancer.  Cervical cancer.  Colorectal cancer.  Skin cancer.  Lung cancer. What should I know about heart disease, diabetes, and high blood pressure? Blood pressure and heart disease  High blood pressure causes heart disease and increases the risk of stroke. This is more likely to develop in people who have high blood pressure readings, are of African descent, or are overweight.  Have your blood pressure checked: ? Every 3-5 years if you are 23-67 years of age. ? Every year if you are 50 years old or older. Diabetes Have regular diabetes screenings. This checks your fasting blood sugar level. Have the screening done:  Once every three years after age 15 if you are at a normal weight and have a low risk for diabetes.  More often and at a younger age if you are overweight or have a high risk for diabetes. What should I know about preventing infection? Hepatitis B If you have a higher risk for hepatitis B, you should be screened for this virus. Talk with your health care provider to find out if you are at risk for hepatitis B infection. Hepatitis C Testing is recommended for:  Everyone born from [birth year] through 1964.  Anyone with known risk factors for hepatitis C. Sexually transmitted infections (STIs)  Get screened for STIs, including gonorrhea and chlamydia, if: ? You are sexually active and are younger than 44 years of age. ? You are older than 44 years of age and your health care provider tells you that  you are at risk for this type of infection. ? Your sexual activity has changed since you were last screened, and you are at increased risk for chlamydia or gonorrhea. Ask your health care provider if you are at risk.  Ask your health care provider about whether you are at high risk for HIV. Your health care provider may recommend a prescription medicine to help prevent HIV infection. If you choose to take medicine to prevent HIV, you  should first get tested for HIV. You should then be tested every 3 months for as long as you are taking the medicine. Pregnancy  If you are about to stop having your period (premenopausal) and you may become pregnant, seek counseling before you get pregnant.  Take 400 to 800 micrograms (mcg) of folic acid every day if you become pregnant.  Ask for birth control (contraception) if you want to prevent pregnancy. Osteoporosis and menopause Osteoporosis is a disease in which the bones lose minerals and strength with aging. This can result in bone fractures. If you are 44 years old or older, or if you are at risk for osteoporosis and fractures, ask your health care provider if you should:  Be screened for bone loss.  Take a calcium or vitamin D supplement to lower your risk of fractures.  Be given hormone replacement therapy (HRT) to treat symptoms of menopause. Follow these instructions at home: Lifestyle  Do not use any products that contain nicotine or tobacco, such as cigarettes, e-cigarettes, and chewing tobacco. If you need help quitting, ask your health care provider.  Do not use street drugs.  Do not share needles.  Ask your health care provider for help if you need support or information about quitting drugs. Alcohol use  Do not drink alcohol if: ? Your health care provider tells you not to drink. ? You are pregnant, may be pregnant, or are planning to become pregnant.  If you drink alcohol: ? Limit how much you use to 0-1 drink a day. ? Limit intake if you are breastfeeding.  Be aware of how much alcohol is in your drink. In the [place] one drink equals one 12 oz bottle of beer (355 mL), one 5 oz glass of wine (148 mL), or one 1 oz glass of hard liquor (44 mL). General instructions  Schedule regular health, dental, and eye exams.  Stay current with your vaccines.  Tell your health care provider if: ? You often feel depressed. ? You have ever been abused or do not feel  safe at home. Summary  Adopting a healthy lifestyle and getting preventive care are important in promoting health and wellness.  Follow your health care provider's instructions about healthy diet, exercising, and getting tested or screened for diseases.  Follow your health care provider's instructions on monitoring your cholesterol and blood pressure. This information is not intended to replace advice given to you by your health care provider. Make sure you discuss any questions you have with your health care provider. Document Revised: 04/16/2018 Document Reviewed: 04/16/2018 Elsevier Patient Education  2021 [employer].

## 2020-06-08 LAB — CELIAC DISEASE COMPREHENSIVE PANEL WITH REFLEXES
(tTG) Ab, IgA: <1 U/mL
Immunoglobulin A: 125 mg/dL (ref 47–310)

## 2020-06-08 LAB — IRON,TIBC AND FERRITIN PANEL
%SAT: 10 % (calc) — ABNORMAL LOW (ref 16–45)
Ferritin: 7 ng/mL — ABNORMAL LOW (ref 16–232)
Iron: 35 ug/dL — ABNORMAL LOW (ref 40–190)
TIBC: 341 mcg/dL (calc) (ref 250–450)

## 2020-06-08 NOTE — Progress Notes (Signed)
Blood sugar is normal .  Cholesterol is good. Iron deficiency anemia is about the same.and negative for celiac screen.  Keep taking iron  Form completed and signed .  In completed folder  Please fax form.

## 2020-06-13 DIAGNOSIS — N6489 Other specified disorders of breast: Secondary | ICD-10-CM | POA: Diagnosis not present

## 2020-06-13 DIAGNOSIS — R922 Inconclusive mammogram: Secondary | ICD-10-CM | POA: Diagnosis not present

## 2020-06-13 DIAGNOSIS — R928 Other abnormal and inconclusive findings on diagnostic imaging of breast: Secondary | ICD-10-CM | POA: Diagnosis not present

## 2020-06-14 DIAGNOSIS — F4323 Adjustment disorder with mixed anxiety and depressed mood: Secondary | ICD-10-CM | POA: Diagnosis not present

## 2020-07-14 DIAGNOSIS — F4323 Adjustment disorder with mixed anxiety and depressed mood: Secondary | ICD-10-CM | POA: Diagnosis not present

## 2020-08-11 DIAGNOSIS — F4323 Adjustment disorder with mixed anxiety and depressed mood: Secondary | ICD-10-CM | POA: Diagnosis not present

## 2020-08-23 DIAGNOSIS — D259 Leiomyoma of uterus, unspecified: Secondary | ICD-10-CM | POA: Diagnosis not present

## 2020-08-23 DIAGNOSIS — Z9851 Tubal ligation status: Secondary | ICD-10-CM | POA: Diagnosis not present

## 2020-08-23 DIAGNOSIS — R140 Abdominal distension (gaseous): Secondary | ICD-10-CM | POA: Diagnosis not present

## 2020-08-23 DIAGNOSIS — N920 Excessive and frequent menstruation with regular cycle: Secondary | ICD-10-CM | POA: Diagnosis not present

## 2020-09-08 DIAGNOSIS — F4323 Adjustment disorder with mixed anxiety and depressed mood: Secondary | ICD-10-CM | POA: Diagnosis not present

## 2020-09-13 DIAGNOSIS — R140 Abdominal distension (gaseous): Secondary | ICD-10-CM | POA: Diagnosis not present

## 2020-09-13 DIAGNOSIS — K5904 Chronic idiopathic constipation: Secondary | ICD-10-CM | POA: Diagnosis not present

## 2020-09-13 DIAGNOSIS — R1033 Periumbilical pain: Secondary | ICD-10-CM | POA: Diagnosis not present

## 2020-09-14 DIAGNOSIS — M25572 Pain in left ankle and joints of left foot: Secondary | ICD-10-CM | POA: Diagnosis not present

## 2020-10-06 DIAGNOSIS — F4323 Adjustment disorder with mixed anxiety and depressed mood: Secondary | ICD-10-CM | POA: Diagnosis not present

## 2020-10-11 DIAGNOSIS — K5904 Chronic idiopathic constipation: Secondary | ICD-10-CM | POA: Diagnosis not present

## 2020-10-11 DIAGNOSIS — R140 Abdominal distension (gaseous): Secondary | ICD-10-CM | POA: Diagnosis not present

## 2020-11-03 DIAGNOSIS — F4323 Adjustment disorder with mixed anxiety and depressed mood: Secondary | ICD-10-CM | POA: Diagnosis not present

## 2020-11-10 DIAGNOSIS — Z113 Encounter for screening for infections with a predominantly sexual mode of transmission: Secondary | ICD-10-CM | POA: Diagnosis not present

## 2020-11-10 DIAGNOSIS — Z1159 Encounter for screening for other viral diseases: Secondary | ICD-10-CM | POA: Diagnosis not present

## 2020-11-10 DIAGNOSIS — Z114 Encounter for screening for human immunodeficiency virus [HIV]: Secondary | ICD-10-CM | POA: Diagnosis not present

## 2020-11-10 DIAGNOSIS — Z01419 Encounter for gynecological examination (general) (routine) without abnormal findings: Secondary | ICD-10-CM | POA: Diagnosis not present

## 2020-11-30 DIAGNOSIS — D509 Iron deficiency anemia, unspecified: Secondary | ICD-10-CM | POA: Diagnosis not present

## 2020-11-30 DIAGNOSIS — N920 Excessive and frequent menstruation with regular cycle: Secondary | ICD-10-CM | POA: Diagnosis not present

## 2020-11-30 DIAGNOSIS — D251 Intramural leiomyoma of uterus: Secondary | ICD-10-CM | POA: Diagnosis not present

## 2020-12-08 DIAGNOSIS — F4323 Adjustment disorder with mixed anxiety and depressed mood: Secondary | ICD-10-CM | POA: Diagnosis not present

## 2020-12-19 DIAGNOSIS — F4323 Adjustment disorder with mixed anxiety and depressed mood: Secondary | ICD-10-CM | POA: Diagnosis not present

## 2021-01-10 DIAGNOSIS — F4323 Adjustment disorder with mixed anxiety and depressed mood: Secondary | ICD-10-CM | POA: Diagnosis not present

## 2021-02-02 ENCOUNTER — Encounter: Payer: Self-pay | Admitting: Internal Medicine

## 2021-02-02 ENCOUNTER — Telehealth (INDEPENDENT_AMBULATORY_CARE_PROVIDER_SITE_OTHER): Payer: BC Managed Care – PPO | Admitting: Internal Medicine

## 2021-02-02 VITALS — Ht 62.0 in | Wt 153.2 lb

## 2021-02-02 DIAGNOSIS — Z79899 Other long term (current) drug therapy: Secondary | ICD-10-CM

## 2021-02-02 DIAGNOSIS — R112 Nausea with vomiting, unspecified: Secondary | ICD-10-CM | POA: Diagnosis not present

## 2021-02-02 DIAGNOSIS — U071 COVID-19: Secondary | ICD-10-CM | POA: Diagnosis not present

## 2021-02-02 MED ORDER — ONDANSETRON 4 MG PO TBDP: 4.0000 mg | ORAL_TABLET | Freq: Three times a day (TID) | ORAL | 0 refills | Status: DC | PRN

## 2021-02-02 NOTE — Progress Notes (Signed)
Virtual Visit via Video Note  I connected with Jessica Dudley  on 02/02/21 at  9:30 AM EDT by a video enabled telemedicine application and verified that I am speaking with the correct person using two identifiers. Location patient: home Location provide home office Persons participating in the virtual visit: patient, provider  WIth national recommendations  regarding COVID 19 pandemic   video visit is advised over in office visit for this patient.  Patient aware  of the limitations of evaluation and management by telemedicine and  availability of in person appointments. and agreed to proceed.   HPI: Jessica Dudley presents for video visit because of developing of COVID infection positive testing and ongoing symptoms. Onset of aggravated sinus symptoms fever and stuffiness on September 21 held out of work 1 day went back on the 23rd but then began feeling worse tested positive home test on the 24th.  Since then has been working from home and fever low-grade just about gone however is developed over the last day or 2 nausea and vomiting harder to keep fluids down's except for ginger ale.  No diarrhea. Other symptoms in this illness included loss of taste and smell headache for 2 to 3 days since resolved cough without shortness of breath. No one else is sick at home. Last menstrual period last week.    ROS: See pertinent positives and negatives per HPI.  Past Medical History:  Diagnosis Date   Hx of pulmonary embolus 07/23/2017   In vitro fertilization 07/02/2016   Hematology supervision in view of previous PE 12/11/15   Iron deficiency anemia    PCOS (polycystic ovarian syndrome)    Pregnancy, high-risk 07/23/2017   Uterine fibroid    Wears glasses     Past Surgical History:  Procedure Laterality Date   CESAREAN SECTION WITH BILATERAL TUBAL LIGATION N/A 08/22/2017   Procedure: PRIMARY CESAREAN SECTION WITH BILATERAL TUBAL LIGATION;  Surgeon: Servando Salina, MD;   Location: [facility];  Service: Obstetrics;  Laterality: N/A;  Classical incision EDD: 09/12/17 Allergy: Vicodin   CHROMOPERTUBATION N/A 11/29/2015   Procedure: ABDOMINAL MYOMECTOMY;  Surgeon: Governor Specking, MD;  Location: [facility];  Service: Gynecology;  Laterality: N/A;   MYOMECTOMY     MYOMECTOMY N/A 08/22/2017   Procedure: MYOMECTOMY;  Surgeon: Servando Salina, MD;  Location: [facility];  Service: Obstetrics;  Laterality: N/A;   NO PAST SURGERIES      Family History  Problem Relation Age of Onset   Hypertension Mother    Prostate cancer Paternal Uncle    Diabetes Maternal Grandmother    Leukemia Cousin    Brain cancer Cousin    Deep vein thrombosis Father    Varicose Veins Father     Social History   Tobacco Use   Smoking status: Never   Smokeless tobacco: Never  Substance Use Topics   Alcohol use: No   Drug use: No      Current Outpatient Medications:    Cholecalciferol (VITAMIN D3) 10 MCG (400 UNIT) tablet, Take by mouth., Disp: , Rfl:    fluticasone (FLONASE) 50 MCG/ACT nasal spray, Place into both nostrils., Disp: , Rfl:    ibuprofen (ADVIL) 600 MG tablet, Take 1 tablet (600 mg total) by mouth every 8 (eight) hours as needed., Disp: 20 tablet, Rfl: 0   Magnesium Oxide (MAG-OX 400 PO), Take 400 mg by mouth daily., Disp: , Rfl:    ondansetron (ZOFRAN ODT) 4 MG disintegrating tablet, Take 1 tablet (4  mg total) by mouth every 8 (eight) hours as needed for nausea or vomiting., Disp: 20 tablet, Rfl: 0   triamcinolone ointment (KENALOG) 0.1 %, Apply topically., Disp: , Rfl:    valACYclovir (VALTREX) 500 MG tablet, Take 1 tablet (500 mg total) by mouth 2 (two) times daily. For 3-5 days for outbreak, Disp: 30 tablet, Rfl: 5   vitamin C (ASCORBIC ACID) 500 MG tablet, Take 500 mg by mouth daily., Disp: , Rfl:   EXAM: BP Readings from Last 3 Encounters:  06/06/20 110/70  06/02/19 120/60  05/26/18 110/68    VITALS per patient if  applicable:  GENERAL: alert, oriented, a appears mildly under the weather normal speech congested normal skin color and turgor.  HEENT: atraumatic, conjunttiva clear, no obvious abnormalities on inspection of external nose and ears  NECK: normal movements of the head and neck  LUNGS: on inspection no signs of respiratory distress, breathing rate appears normal, no obvious gross SOB, gasping or wheezing  CV: no obvious cyanosis  PSYCH/NEURO: pleasant and cooperative, no obvious depression or anxiety, speech and thought processing grossly intact   ASSESSMENT AND PLAN:  Discussed the following assessment and plan:    ICD-10-CM   1. COVID-19 virus infection  U07.1    in immunized loss of taste and smell congestion and NV     2. Non-intractable vomiting with nausea, unspecified vomiting type  R11.2     3. Medication management  Z79.899       Counseled.  Expectant management for alarm symptoms and when to see ED care.  To work on hydration send in [place] amounts of clear liquids frequently to rehydrate and then as tolerated. Note for work absence  until October 4 Tuesday if feeling better. Rest and hydration.    Expectant management and discussion of plan and treatment with opportunity to ask questions and all were answered. The patient agreed with the plan and demonstrated an understanding of the instructions.   Advised to call back or seek an in-person evaluation if worsening  or having  further concerns .  And not improving. Return if symptoms worsen or fail to improve as expected. I  Shanon Ace, MD

## 2021-02-07 DIAGNOSIS — F4323 Adjustment disorder with mixed anxiety and depressed mood: Secondary | ICD-10-CM | POA: Diagnosis not present

## 2021-03-07 DIAGNOSIS — F4323 Adjustment disorder with mixed anxiety and depressed mood: Secondary | ICD-10-CM | POA: Diagnosis not present

## 2021-04-13 DIAGNOSIS — F4323 Adjustment disorder with mixed anxiety and depressed mood: Secondary | ICD-10-CM | POA: Diagnosis not present

## 2021-05-09 DIAGNOSIS — F4323 Adjustment disorder with mixed anxiety and depressed mood: Secondary | ICD-10-CM | POA: Diagnosis not present

## 2021-05-16 DIAGNOSIS — N898 Other specified noninflammatory disorders of vagina: Secondary | ICD-10-CM | POA: Diagnosis not present

## 2021-05-16 DIAGNOSIS — R102 Pelvic and perineal pain: Secondary | ICD-10-CM | POA: Diagnosis not present

## 2021-06-07 DIAGNOSIS — Z1231 Encounter for screening mammogram for malignant neoplasm of breast: Secondary | ICD-10-CM | POA: Diagnosis not present

## 2021-06-12 ENCOUNTER — Encounter: Payer: Self-pay | Admitting: Internal Medicine

## 2021-06-12 ENCOUNTER — Ambulatory Visit (INDEPENDENT_AMBULATORY_CARE_PROVIDER_SITE_OTHER): Payer: BC Managed Care – PPO | Admitting: Internal Medicine

## 2021-06-12 VITALS — BP 120/76 | HR 80 | Temp 98.6°F | Ht 61.5 in | Wt 141.8 lb

## 2021-06-12 DIAGNOSIS — Z79899 Other long term (current) drug therapy: Secondary | ICD-10-CM | POA: Diagnosis not present

## 2021-06-12 DIAGNOSIS — D649 Anemia, unspecified: Secondary | ICD-10-CM | POA: Diagnosis not present

## 2021-06-12 DIAGNOSIS — Z0000 Encounter for general adult medical examination without abnormal findings: Secondary | ICD-10-CM | POA: Diagnosis not present

## 2021-06-12 DIAGNOSIS — Z23 Encounter for immunization: Secondary | ICD-10-CM

## 2021-06-12 LAB — LIPID PANEL
Cholesterol: 159 mg/dL (ref 0–200)
HDL: 55.5 mg/dL (ref >39.00)
LDL Cholesterol: 93 mg/dL (ref 0–99)
NonHDL: 103.56
Total CHOL/HDL Ratio: 3
Triglycerides: 54 mg/dL (ref 0.0–149.0)
VLDL: 10.8 mg/dL (ref 0.0–40.0)

## 2021-06-12 LAB — CBC WITH DIFFERENTIAL/PLATELET
Basophils Absolute: 0 10*3/uL (ref 0.0–0.1)
Basophils Relative: 0.7 % (ref 0.0–3.0)
Eosinophils Absolute: 0 10*3/uL (ref 0.0–0.7)
Eosinophils Relative: 0.2 % (ref 0.0–5.0)
HCT: 34.9 % — ABNORMAL LOW (ref 36.0–46.0)
Hemoglobin: 10.6 g/dL — ABNORMAL LOW (ref 12.0–15.0)
Lymphocytes Relative: 34 % (ref 12.0–46.0)
Lymphs Abs: 1.3 10*3/uL (ref 0.7–4.0)
MCHC: 30.4 g/dL (ref 30.0–36.0)
MCV: 63.4 fl — ABNORMAL LOW (ref 78.0–100.0)
Monocytes Absolute: 0.5 10*3/uL (ref 0.1–1.0)
Monocytes Relative: 12.1 % — ABNORMAL HIGH (ref 3.0–12.0)
Neutro Abs: 2 10*3/uL (ref 1.4–7.7)
Neutrophils Relative %: 53 % (ref 43.0–77.0)
Platelets: 237 10*3/uL (ref 150.0–400.0)
RBC: 5.5 Mil/uL — ABNORMAL HIGH (ref 3.87–5.11)
RDW: 16.7 % — ABNORMAL HIGH (ref 11.5–15.5)
WBC: 3.8 10*3/uL — ABNORMAL LOW (ref 4.0–10.5)

## 2021-06-12 LAB — BASIC METABOLIC PANEL
BUN: 13 mg/dL (ref 6–23)
CO2: 26 mEq/L (ref 19–32)
Calcium: 9.5 mg/dL (ref 8.4–10.5)
Chloride: 105 mEq/L (ref 96–112)
Creatinine, Ser: 0.89 mg/dL (ref 0.40–1.20)
GFR: 78.72 mL/min (ref >60.00)
Glucose, Bld: 91 mg/dL (ref 70–99)
Potassium: 3.6 mEq/L (ref 3.5–5.1)
Sodium: 137 mEq/L (ref 135–145)

## 2021-06-12 LAB — HEPATIC FUNCTION PANEL
ALT: 13 U/L (ref 0–35)
AST: 14 U/L (ref 0–37)
Albumin: 4.6 g/dL (ref 3.5–5.2)
Alkaline Phosphatase: 39 U/L (ref 39–117)
Bilirubin, Direct: 0.1 mg/dL (ref 0.0–0.3)
Total Bilirubin: 0.7 mg/dL (ref 0.2–1.2)
Total Protein: 7.4 g/dL (ref 6.0–8.3)

## 2021-06-12 LAB — TSH: TSH: 0.67 u[IU]/mL (ref 0.35–5.50)

## 2021-06-12 LAB — HEMOGLOBIN A1C: Hgb A1c MFr Bld: 5.6 % (ref 4.6–6.5)

## 2021-06-12 NOTE — Progress Notes (Signed)
Chief Complaint  Patient presents with   Annual Exam    Fasting     HPI: Patient  ERNESTO LASHWAY  46 y.o. comes in today for [place] visit   She does have some information from her workplace for her health parameters.  Cholesterol blood pressure  She has recently changed eating habits    has helped overall any GI symptoms and her.  Began with fast  from church and   low carbs .  He is remained on this  Avoiding bread pasta and rice.  Is pescatarian.   Lactose intolerance .  Bites of macaroni and cheese and had GI symptoms. She has been able to lose some weight and wants to pick up on her activity  Health Maintenance  Topic Date Due   COVID-19 Vaccine (3 - Pfizer risk series) 12/10/2021 (Originally 10/03/2019)   Hepatitis C Screening  12/10/2021 (Originally 10/20/1994)   PAP SMEAR-Modifier  10/20/2021   TETANUS/TDAP  06/13/2031   INFLUENZA VACCINE  Completed   HIV Screening  Completed   HPV VACCINES  Aged Out   Health Maintenance Review LIFESTYLE:  Exercise:  getting back into   3 x per week  Tobacco/ETS: n Alcohol:   wine Sugar beverages:  juice cranberry  Sleep: 6-8   46 yo Drug use: no HH of   3   no pets versus final but ex has some harassing type behavior  coping 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 Collene Mares had suggested beginning colon cancer screening at 65: Negative first-degree relative but other relatives with colon cancer.  ROS:  REST of 12 system review negative except as per HPI   Past Medical History:  Diagnosis Date   Hx of pulmonary embolus 07/23/2017   In vitro fertilization 07/02/2016   Hematology supervision in view of previous PE 12/11/15   Iron deficiency anemia    PCOS (polycystic ovarian syndrome)    Pregnancy, high-risk 07/23/2017   Uterine fibroid    Wears glasses     Past Surgical History:  Procedure Laterality Date   CESAREAN SECTION WITH BILATERAL TUBAL LIGATION N/A 08/22/2017   Procedure: PRIMARY CESAREAN SECTION  WITH BILATERAL TUBAL LIGATION;  Surgeon: Servando Salina, MD;  Location: [facility];  Service: Obstetrics;  Laterality: N/A;  Classical incision EDD: 09/12/17 Allergy: Vicodin   CHROMOPERTUBATION N/A 11/29/2015   Procedure: ABDOMINAL MYOMECTOMY;  Surgeon: Governor Specking, MD;  Location: [facility];  Service: Gynecology;  Laterality: N/A;   MYOMECTOMY     MYOMECTOMY N/A 08/22/2017   Procedure: MYOMECTOMY;  Surgeon: Servando Salina, MD;  Location: [facility];  Service: Obstetrics;  Laterality: N/A;   NO PAST SURGERIES      Family History  Problem Relation Age of Onset   Hypertension Mother    Prostate cancer Paternal Uncle    Diabetes Maternal Grandmother    Leukemia Cousin    Brain cancer Cousin    Deep vein thrombosis Father    Varicose Veins Father     Social History   Socioeconomic History   Marital status: Married    Spouse name: Not on file   Number of children: Not on file   Years of education: Not on file   Highest education level: Not on file  Occupational History   Not on file  Tobacco Use   Smoking status: Never   Smokeless tobacco: Never  Substance and Sexual Activity   Alcohol use: No  Drug use: No   Sexual activity: Yes    Birth control/protection: None  Other Topics Concern   Not on file  Social History Narrative   Divorced  Remarried   In vitro to deliver in April   On lovenox      Regular exercise- no   HH of 3 with daughter no pets       daughter    Works [employer]   40 hours    MOM ruby Lallier   Had 71 yo at home goes to day care 2022   Social Determinants of Health   Financial Resource Strain: Not on file  Food Insecurity: Not on file  Transportation Needs: Not on file  Physical Activity: Not on file  Stress: Not on file  Social Connections: Not on file    Outpatient Medications Prior to Visit  Medication Sig Dispense Refill   Cholecalciferol (VITAMIN D3) 10 MCG (400 UNIT) tablet Take by mouth.      fluticasone (FLONASE) 50 MCG/ACT nasal spray Place into both nostrils.     ibuprofen (ADVIL) 600 MG tablet Take 1 tablet (600 mg total) by mouth every 8 (eight) hours as needed. 20 tablet 0   Magnesium Oxide (MAG-OX 400 PO) Take 400 mg by mouth daily.     ondansetron (ZOFRAN ODT) 4 MG disintegrating tablet Take 1 tablet (4 mg total) by mouth every 8 (eight) hours as needed for nausea or vomiting. 20 tablet 0   triamcinolone ointment (KENALOG) 0.1 % Apply topically.     valACYclovir (VALTREX) 500 MG tablet Take 1 tablet (500 mg total) by mouth 2 (two) times daily. For 3-5 days for outbreak 30 tablet 5   vitamin C (ASCORBIC ACID) 500 MG tablet Take 500 mg by mouth daily.     No facility-administered medications prior to visit.     EXAM:  BP 120/76 (BP Location: Left Arm, Patient Position: Sitting, Cuff Size: Normal)    Pulse 80    Temp 98.6 F (37 C) (Oral)    Ht 5' 1.5" (1.562 m)    Wt 141 lb 12.8 oz (64.3 kg)    LMP 05/26/2021    SpO2 99%    BMI 26.36 kg/m   Body mass index is 26.36 kg/m. Wt Readings from Last 3 Encounters:  06/12/21 141 lb 12.8 oz (64.3 kg)  02/02/21 153 lb 3.2 oz (69.5 kg)  06/06/20 153 lb 3.2 oz (69.5 kg)    Physical Exam: Vital signs reviewed YNW:GNFA is a well-developed well-nourished alert cooperative    who appearsr stated age in no acute distress.  HEENT: normocephalic atraumatic , Eyes: PERRL EOM's full, conjunctiva clear, Nares: paten,t no deformity discharge or tenderness., Ears: no deformity EAC's clear TMs with normal landmarks. Mouth: masked  NECK: supple without masses, thyromegaly or bruits. CHEST/PULM:  Clear to auscultation and percussion breath sounds equal no wheeze , rales or rhonchi. No chest wall deformities or tenderness. Breast: normal by inspection . No dimpling, discharge, masses, tenderness or discharge . CV: PMI is nondisplaced, S1 S2 no gallops, murmurs, rubs. Peripheral pulses are full without delay.No JVD .  ABDOMEN: Bowel sounds  normal nontender  No guard or rebound, no hepato splenomegal no CVA tenderness.   Extremtities:  No clubbing cyanosis or edema, no acute joint swelling or redness no focal atrophy NEURO:  Oriented x3, cranial nerves 3-12 appear to be intact, no obvious focal weakness,gait within normal limits no abnormal reflexes or asymmetrical SKIN: No acute rashes normal turgor, color, no  bruising or petechiae. PSYCH: Oriented, good eye contact, no obvious depression anxiety, cognition and judgment appear normal. LN: no cervical axillary inguinal adenopathy  Lab Results  Component Value Date   WBC 4.3 06/06/2020   HGB 11.0 (L) 06/06/2020   HCT 34.9 (L) 06/06/2020   PLT 213.0 06/06/2020   GLUCOSE 81 06/06/2020   CHOL 164 06/06/2020   TRIG 43.0 06/06/2020   HDL 56.50 06/06/2020   LDLCALC 99 06/06/2020   ALT 12 02/25/2020   AST 12 02/25/2020   NA 141 06/06/2020   K 4.0 06/06/2020   CL 107 06/06/2020   CREATININE 0.87 06/06/2020   BUN 16 06/06/2020   CO2 28 06/06/2020   TSH 0.92 06/03/2019   INR 1.08 12/11/2015   HGBA1C 5.5 06/03/2019    BP Readings from Last 3 Encounters:  06/12/21 120/76  06/06/20 110/70  06/02/19 120/60   Capillary lipids done at work total cholesterol 139 HDL 49 triglycerides 63 LDL 77 pressure 117/79. Lab plan reviewed with patient  fastin today   ASSESSMENT AND PLAN:  Discussed the following assessment and plan:    ICD-10-CM   1. Visit for preventive health examination  Q11.94 Basic metabolic panel    CBC with Differential/Platelet    Hemoglobin A1c    Hepatic function panel    TSH    Lipid panel    Tdap vaccine greater than or equal to 7yo IM    Lipid panel    TSH    Hepatic function panel    Hemoglobin A1c    CBC with Differential/Platelet    Basic metabolic panel    2. Medication management  R74.081 Basic metabolic panel    CBC with Differential/Platelet    Hemoglobin A1c    Hepatic function panel    TSH    Lipid panel    Lipid panel    TSH     Hepatic function panel    Hemoglobin A1c    CBC with Differential/Platelet    Basic metabolic panel    3. Anemia, unspecified type  K48.1 Basic metabolic panel    CBC with Differential/Platelet    Hemoglobin A1c    Hepatic function panel    TSH    Lipid panel    Lipid panel    TSH    Hepatic function panel    Hemoglobin A1c    CBC with Differential/Platelet    Basic metabolic panel     Discussed continue healthy lifestyle and plan seems appropriate Tdap today Follow-up 1 year depending on results She should discuss with Dr. Collene Mares about colon screening or evaluation. Return in about 1 year (around 06/12/2022) for depending on results.  Patient Care Team: Anaija Wissink, Standley Brooking, MD as PCP - General Beryle Beams Alyson Locket, MD as Consulting Physician (Oncology) Governor Specking, MD as Consulting Physician (Obstetrics and Gynecology) Servando Salina, MD as Consulting Physician (Obstetrics and Gynecology) Patient Instructions  Good to see you today .  Disc colon cancer screening with Dr Collene Mares when you see her next  . Continue lifestyle intervention healthy eating and exercise . As planned .  If all ok then  yearly check . Tdap booster today   Standley Brooking. Janeya Deyo M.D.

## 2021-06-12 NOTE — Patient Instructions (Signed)
Good to see you today .  Disc colon cancer screening with Dr Collene Mares when you see her next  . Continue lifestyle intervention healthy eating and exercise . As planned .  If all ok then  yearly check . Tdap booster today

## 2021-06-13 DIAGNOSIS — F4323 Adjustment disorder with mixed anxiety and depressed mood: Secondary | ICD-10-CM | POA: Diagnosis not present

## 2021-06-13 NOTE — Progress Notes (Signed)
Blood work is good except still has baseline anemia Cholesterol is in a good range no diabetes

## 2021-06-21 DIAGNOSIS — F4323 Adjustment disorder with mixed anxiety and depressed mood: Secondary | ICD-10-CM | POA: Diagnosis not present

## 2021-06-27 DIAGNOSIS — L538 Other specified erythematous conditions: Secondary | ICD-10-CM | POA: Diagnosis not present

## 2021-06-27 DIAGNOSIS — L728 Other follicular cysts of the skin and subcutaneous tissue: Secondary | ICD-10-CM | POA: Diagnosis not present

## 2021-06-27 DIAGNOSIS — L309 Dermatitis, unspecified: Secondary | ICD-10-CM | POA: Diagnosis not present

## 2021-06-27 DIAGNOSIS — L910 Hypertrophic scar: Secondary | ICD-10-CM | POA: Diagnosis not present

## 2021-07-04 DIAGNOSIS — F4323 Adjustment disorder with mixed anxiety and depressed mood: Secondary | ICD-10-CM | POA: Diagnosis not present

## 2021-07-06 NOTE — Progress Notes (Signed)
This is a late message reply . Apologies for  delay  Yes should be taking iron as tolerated every day or every other day .  and also follow blood count.  Please order  cbc diff  , ibc ferritin panel   for 4 months from now to monitor progress. Dx anemia

## 2021-07-06 NOTE — Addendum Note (Signed)
Addended by: Nilda Riggs on: 07/06/2021 01:52 PM   Modules accepted: Orders

## 2021-07-10 DIAGNOSIS — N898 Other specified noninflammatory disorders of vagina: Secondary | ICD-10-CM | POA: Diagnosis not present

## 2021-08-01 DIAGNOSIS — F4323 Adjustment disorder with mixed anxiety and depressed mood: Secondary | ICD-10-CM | POA: Diagnosis not present

## 2021-08-24 DIAGNOSIS — F4323 Adjustment disorder with mixed anxiety and depressed mood: Secondary | ICD-10-CM | POA: Diagnosis not present

## 2021-09-11 DIAGNOSIS — F4323 Adjustment disorder with mixed anxiety and depressed mood: Secondary | ICD-10-CM | POA: Diagnosis not present

## 2021-09-26 DIAGNOSIS — F4323 Adjustment disorder with mixed anxiety and depressed mood: Secondary | ICD-10-CM | POA: Diagnosis not present

## 2021-10-10 DIAGNOSIS — F4323 Adjustment disorder with mixed anxiety and depressed mood: Secondary | ICD-10-CM | POA: Diagnosis not present

## 2021-10-26 DIAGNOSIS — F4323 Adjustment disorder with mixed anxiety and depressed mood: Secondary | ICD-10-CM | POA: Diagnosis not present

## 2021-10-26 DIAGNOSIS — N898 Other specified noninflammatory disorders of vagina: Secondary | ICD-10-CM | POA: Diagnosis not present

## 2021-11-06 ENCOUNTER — Other Ambulatory Visit: Payer: BC Managed Care – PPO

## 2021-11-06 DIAGNOSIS — D649 Anemia, unspecified: Secondary | ICD-10-CM

## 2021-11-06 LAB — CBC WITH DIFFERENTIAL/PLATELET
Basophils Absolute: 0.1 10*3/uL (ref 0.0–0.1)
Basophils Relative: 1.8 % (ref 0.0–3.0)
Eosinophils Absolute: 0 10*3/uL (ref 0.0–0.7)
Eosinophils Relative: 1 % (ref 0.0–5.0)
HCT: 27.5 % — ABNORMAL LOW (ref 36.0–46.0)
Hemoglobin: 8.5 g/dL — ABNORMAL LOW (ref 12.0–15.0)
Lymphocytes Relative: 36.7 % (ref 12.0–46.0)
Lymphs Abs: 1.2 10*3/uL (ref 0.7–4.0)
MCHC: 30.9 g/dL (ref 30.0–36.0)
MCV: 60.3 fl — ABNORMAL LOW (ref 78.0–100.0)
Monocytes Absolute: 0.4 10*3/uL (ref 0.1–1.0)
Monocytes Relative: 11.1 % (ref 3.0–12.0)
Neutro Abs: 1.6 10*3/uL (ref 1.4–7.7)
Neutrophils Relative %: 49.4 % (ref 43.0–77.0)
Platelets: 209 10*3/uL (ref 150.0–400.0)
RBC: 4.56 Mil/uL (ref 3.87–5.11)
RDW: 18.1 % — ABNORMAL HIGH (ref 11.5–15.5)
WBC: 3.2 10*3/uL — ABNORMAL LOW (ref 4.0–10.5)

## 2021-11-06 LAB — IBC + FERRITIN
Ferritin: 3.3 ng/mL — ABNORMAL LOW (ref 10.0–291.0)
Iron: 16 ug/dL — ABNORMAL LOW (ref 42–145)
Saturation Ratios: 3.3 % — ABNORMAL LOW (ref 20.0–50.0)
TIBC: 484.4 ug/dL — ABNORMAL HIGH (ref 250.0–450.0)
Transferrin: 346 mg/dL (ref 212.0–360.0)

## 2021-11-09 NOTE — Progress Notes (Signed)
Hemoglobin has dropped to 8 range. And iron still low ....  Are you having excess bleeding?  Taking iron?  I suggest we  get you back to hematology  poss need for iron  infusion.     If patient agrees  please do referral to  hematology for persistent iron deficiency  anemia   hx of PE.

## 2021-11-10 DIAGNOSIS — F4323 Adjustment disorder with mixed anxiety and depressed mood: Secondary | ICD-10-CM | POA: Diagnosis not present

## 2021-11-13 ENCOUNTER — Encounter: Payer: Self-pay | Admitting: Internal Medicine

## 2021-11-13 ENCOUNTER — Telehealth: Payer: Self-pay | Admitting: Internal Medicine

## 2021-11-13 DIAGNOSIS — E611 Iron deficiency: Secondary | ICD-10-CM

## 2021-11-13 DIAGNOSIS — Z113 Encounter for screening for infections with a predominantly sexual mode of transmission: Secondary | ICD-10-CM | POA: Diagnosis not present

## 2021-11-13 DIAGNOSIS — D509 Iron deficiency anemia, unspecified: Secondary | ICD-10-CM | POA: Diagnosis not present

## 2021-11-13 DIAGNOSIS — Z01419 Encounter for gynecological examination (general) (routine) without abnormal findings: Secondary | ICD-10-CM | POA: Diagnosis not present

## 2021-11-13 DIAGNOSIS — D251 Intramural leiomyoma of uterus: Secondary | ICD-10-CM | POA: Diagnosis not present

## 2021-11-13 DIAGNOSIS — Z1159 Encounter for screening for other viral diseases: Secondary | ICD-10-CM | POA: Diagnosis not present

## 2021-11-13 DIAGNOSIS — R87610 Atypical squamous cells of undetermined significance on cytologic smear of cervix (ASC-US): Secondary | ICD-10-CM | POA: Diagnosis not present

## 2021-11-13 NOTE — Telephone Encounter (Signed)
Pt is calling and would like blood work results 

## 2021-11-13 NOTE — Telephone Encounter (Signed)
See result note.  

## 2021-11-15 DIAGNOSIS — D251 Intramural leiomyoma of uterus: Secondary | ICD-10-CM | POA: Diagnosis not present

## 2021-11-15 DIAGNOSIS — N920 Excessive and frequent menstruation with regular cycle: Secondary | ICD-10-CM | POA: Diagnosis not present

## 2021-11-15 DIAGNOSIS — Z9889 Other specified postprocedural states: Secondary | ICD-10-CM | POA: Diagnosis not present

## 2021-11-21 ENCOUNTER — Other Ambulatory Visit: Payer: Self-pay | Admitting: Obstetrics and Gynecology

## 2021-11-21 DIAGNOSIS — D251 Intramural leiomyoma of uterus: Secondary | ICD-10-CM

## 2021-11-22 ENCOUNTER — Ambulatory Visit
Admission: RE | Admit: 2021-11-22 | Discharge: 2021-11-22 | Disposition: A | Discharge status: Home / Self Care | Payer: BC Managed Care – PPO | Source: Ambulatory Visit | Attending: Obstetrics and Gynecology | Admitting: Obstetrics and Gynecology

## 2021-11-22 ENCOUNTER — Encounter: Payer: Self-pay | Admitting: *Deleted

## 2021-11-22 DIAGNOSIS — D251 Intramural leiomyoma of uterus: Secondary | ICD-10-CM

## 2021-11-22 DIAGNOSIS — D259 Leiomyoma of uterus, unspecified: Secondary | ICD-10-CM | POA: Diagnosis not present

## 2021-11-22 DIAGNOSIS — N920 Excessive and frequent menstruation with regular cycle: Secondary | ICD-10-CM | POA: Diagnosis not present

## 2021-11-22 HISTORY — PX: IR RADIOLOGIST EVAL & MGMT: IMG5224

## 2021-11-22 NOTE — Consult Note (Signed)
Chief Complaint: Menorrhagia  Referring Physician(s): Cousins,Sheronette  History of Present Illness: Jessica Dudley is a 46 y.o. female presenting today as a scheduled consultation for symptomatic uterine fibroids.  She complains of 3 of 3 categories of symptoms of fibroids, bleeding, bulk and pain.   She tells me that regarding her bleeding symptoms, she has had heavy bleeding for years, but her symptoms have worsened recently.  Her cycles are regular.  She has not required blood transfusion but is being considered for iron infusion. Her most recent Hgb on 11/06/2021 was 8.5.  Regarding pain, she has minimal cramping, but experiences moderate to severe back pain beginning just before her cycle and lasting through out.  Regarding bulk symptoms, she has continuous pressure on her bladder.  Prior workup for UTI's have been negative.   She has 2 children, and says that she has no intention of further pregnancy.   Past Medical History:  Diagnosis Date   Hx of pulmonary embolus 07/23/2017   In vitro fertilization 07/02/2016   Hematology supervision in view of previous PE 12/11/15   Iron deficiency anemia    PCOS (polycystic ovarian syndrome)    Pregnancy, high-risk 07/23/2017   Uterine fibroid    Wears glasses     Past Surgical History:  Procedure Laterality Date   CESAREAN SECTION WITH BILATERAL TUBAL LIGATION N/A 08/22/2017   Procedure: PRIMARY CESAREAN SECTION WITH BILATERAL TUBAL LIGATION;  Surgeon: Servando Salina, MD;  Location: [place];  Service: Obstetrics;  Laterality: N/A;  Classical incision EDD: 09/12/17 Allergy: Vicodin   CHROMOPERTUBATION N/A 11/29/2015   Procedure: ABDOMINAL MYOMECTOMY;  Surgeon: Governor Specking, MD;  Location: [place];  Service: Gynecology;  Laterality: N/A;   MYOMECTOMY     MYOMECTOMY N/A 08/22/2017   Procedure: MYOMECTOMY;  Surgeon: Servando Salina, MD;  Location: [place];  Service: Obstetrics;   Laterality: N/A;   NO PAST SURGERIES      Allergies: Norco [hydrocodone-acetaminophen] and Oxycodone-acetaminophen  Medications: Prior to Admission medications   Medication Sig Start Date End Date Taking? Authorizing Provider  Cholecalciferol (VITAMIN D3) 10 MCG (400 UNIT) tablet Take by mouth.    [provider]  fluticasone (FLONASE) 50 MCG/ACT nasal spray Place into both nostrils. 12/01/19   [provider]  ibuprofen (ADVIL) 600 MG tablet Take 1 tablet (600 mg total) by mouth every 8 (eight) hours as needed. 12/27/18   Briscoe Deutscher, DO  Magnesium Oxide (MAG-OX 400 PO) Take 400 mg by mouth daily.    [provider]  ondansetron (ZOFRAN ODT) 4 MG disintegrating tablet Take 1 tablet (4 mg total) by mouth every 8 (eight) hours as needed for nausea or vomiting. 02/02/21   Panosh, Standley Brooking, MD  triamcinolone ointment (KENALOG) 0.1 % Apply topically. 01/08/20   [provider]  valACYclovir (VALTREX) 500 MG tablet Take 1 tablet (500 mg total) by mouth 2 (two) times daily. For 3-5 days for outbreak 02/22/16   Panosh, Standley Brooking, MD  vitamin C (ASCORBIC ACID) 500 MG tablet Take 500 mg by mouth daily.    [provider]     Family History  Problem Relation Age of Onset   Hypertension Mother    Prostate cancer Paternal Uncle    Diabetes Maternal Grandmother    Leukemia Cousin    Brain cancer Cousin    Deep vein thrombosis Father    Varicose Veins Father     Social History   Socioeconomic History   Marital  status: Divorced    Spouse name: Not on file   Number of children: Not on file   Years of education: Not on file   Highest education level: Not on file  Occupational History   Not on file  Tobacco Use   Smoking status: Never   Smokeless tobacco: Never  Substance and Sexual Activity   Alcohol use: No   Drug use: No   Sexual activity: Yes    Birth control/protection: None  Other Topics Concern   Not on file  Social History Narrative    Divorced  Remarried   In vitro to deliver in April   On lovenox      Regular exercise- no   HH of 3 with daughter no pets       daughter    Works [employer]   40 hours    MOM ruby [place]   Had 38 yo at home goes to day care 2022   Social Determinants of Health   Financial Resource Strain: Not on file  Food Insecurity: Not on file  Transportation Needs: Not on file  Physical Activity: Not on file  Stress: Not on file  Social Connections: Not on file   Review of Systems: A 12 point ROS discussed and pertinent positives are indicated in the HPI above.  All other systems are negative.  Review of Systems  Vital Signs: BP 115/76 (BP Location: Right Arm)   Pulse 86   SpO2 100%   Physical Exam Constitutional:      Appearance: She is normal weight.  Pulmonary:     Effort: Pulmonary effort is normal.  Abdominal:     General: Abdomen is flat.     Palpations: Abdomen is soft.  Skin:    General: Skin is warm and dry.  Neurological:     Mental Status: She is alert and oriented to person, place, and time.  Psychiatric:        Mood and Affect: Mood normal.        Behavior: Behavior normal.   Imaging: No results found.  Labs:  CBC: Recent Labs    06/12/21 1151 11/06/21 0945  WBC 3.8* 3.2*  HGB 10.6* 8.5*  HCT 34.9* 27.5*  PLT 237.0 209.0    COAGS: No results for input(s): "INR", "APTT" in the last 8760 hours.  BMP: Recent Labs    06/12/21 1151  NA 137  K 3.6  CL 105  CO2 26  GLUCOSE 91  BUN 13  CALCIUM 9.5  CREATININE 0.89    LIVER FUNCTION TESTS: Recent Labs    06/12/21 1151  BILITOT 0.7  AST 14  ALT 13  ALKPHOS 39  PROT 7.4  ALBUMIN 4.6    TUMOR MARKERS: No results for input(s): "AFPTM", "CEA", "CA199", "CHROMGRNA" in the last 8760 hours.  Assessment and Plan:  [name] in a 46 y.o. female presenting to IR clinic for discussion of symptomatic uterine fibroids.  Her primary symptoms are bleeding, back pain, and bladder pressure.  She had a  myomectomy in 2016/2017.  Her bleeding symptoms improved initially, but returned to baseline and have worsened in recent months.  I had a lengthy conversation with her regarding the anatomy, pathology, and treatment options for fibroids.  She was counseled on options for treatment of uterine fibroids including doing nothing, myomectomy, hysterectomy, and uterine artery embolization.  Regarding UFE, we had a discussion the efficacy, expectations regarding outcomes/long term efficacy, and risk/benefit.    I shared with her a summary  of the literature for UFE efficacy, which generally is accepted to be adequate symptom relief with good restoration of quality of life at 1 year in 90% or greater of patients for bleeding > pain > bulk symptoms.  I also discussed with her that there is a cited rate of retreatment in ~15-20% of patients in the long term, with overall excellent long term utility of symptom relief and QOL.    We discussed the expectations not just for the treatment day and post treatment admission to 23 hour observation, but the first 3 months, 6 months - 12 months, and our follow up.  I recommended that she take 2 weeks off of work to allow her body adequate recovery time.   Regarding risks, specific risks discussed include: post-embolization syndrome, bleeding, infection, contrast reaction, kidney/artery injury, need for further surgery/procedure, including hysterectomy, need for hospitalization, cardiopulmonary collapse, death.    We discussed an approximate 7% chance of early onset menopause, mostly in women >88 years of age, with a less than 1% risk for women less than 82 years of age.  We discussed the need to evaluate the ovarian arteries and in some cases the need to treat the fibroids via the ovarian arteries which can lead to a higher risk of early menopause.    Regarding post-embolization syndrome, I did let her know that this is essentially expected, with a typical prodromal syndrome  lasting 4-7 days typically, and usually treated with medication/support such as hydration, rest, analgesics, PO nausea medications, and stool softeners.   We also discussed the need for MRI and possibility that she may be excluded by findings, or possibly requiring a follow up conversation if we feel that Adenomyosis is present/contributing.  After discussing, she would like to proceed with MRI and UFE.   Plan:  - Obtain MRI to evaluate uterus/fibroids - Tentatively to proceed with uterine artery embolization at first available  Thank you for this interesting consult.  I greatly enjoyed meeting AHRI OLSON and look forward to participating in their care.  A copy of this report was sent to the requesting provider on this date.  Electronically Signed: Paula Libra Colten Desroches 11/22/2021, 4:04 PM   I spent a total of  40 Minutes in face to face in clinical consultation, greater than 50% of which was counseling/coordinating care for Uterine Fibroids.

## 2021-11-22 NOTE — Telephone Encounter (Signed)
Noted  

## 2021-11-23 ENCOUNTER — Other Ambulatory Visit: Payer: Self-pay | Admitting: Interventional Radiology

## 2021-11-23 ENCOUNTER — Other Ambulatory Visit: Payer: Self-pay | Admitting: *Deleted

## 2021-11-23 DIAGNOSIS — D259 Leiomyoma of uterus, unspecified: Secondary | ICD-10-CM

## 2021-11-23 DIAGNOSIS — D509 Iron deficiency anemia, unspecified: Secondary | ICD-10-CM

## 2021-11-23 NOTE — Progress Notes (Signed)
cbc

## 2021-11-28 NOTE — Progress Notes (Unsigned)
New Hematology/Oncology Consult   Requesting MD: Dr. Shanon Ace  [phone]      Reason for Consult: Iron deficiency  HPI: [name] is a 46 year old woman referred for evaluation of iron deficiency.  She had labs done 11/06/2021-hemoglobin 8.5, MCV 60, RDW 18.1, white count 3.2, platelet count 209,000, ferritin 3.3, iron 16, transferrin 346, saturation 3.3%, TIBC 484.  Review of labs in the EMR shows longstanding microcytic anemia dating to January 2013.  Ferritin tends to be in low to low normal range.  She was referred to Interventional Radiology for evaluation of menorrhagia in the setting of uterine fibroids.  She is currently undergoing additional evaluation.  Tentative plan is for uterine artery embolization.  She has history of low-volume pulmonary embolus related to surgical procedure, previously saw Dr. Beryle Beams.  She was anticoagulated during a pregnancy 2018/2019 due to previous PE, need for C-section, myomectomy and age.  She completed planned prophylactic anticoagulation in late May/early June 2019.  Per Dr. Azucena Freed office note 10/01/2017 she was felt to be low risk for thrombotic event in the future and did not recommend any special prophylaxis to cover surgical procedures.     Past Medical History:  Diagnosis Date   Hx of pulmonary embolus 07/23/2017   In vitro fertilization 07/02/2016   Hematology supervision in view of previous PE 12/11/15   Iron deficiency anemia    PCOS (polycystic ovarian syndrome)    Pregnancy, high-risk 07/23/2017   Uterine fibroid    Wears glasses   :   Past Surgical History:  Procedure Laterality Date   CESAREAN SECTION WITH BILATERAL TUBAL LIGATION N/A 08/22/2017   Procedure: PRIMARY CESAREAN SECTION WITH BILATERAL TUBAL LIGATION;  Surgeon: Servando Salina, MD;  Location: [facility];  Service: Obstetrics;  Laterality: N/A;  Classical incision EDD: 09/12/17 Allergy: Vicodin   CHROMOPERTUBATION N/A 11/29/2015   Procedure:  ABDOMINAL MYOMECTOMY;  Surgeon: Governor Specking, MD;  Location: [facility];  Service: Gynecology;  Laterality: N/A;   IR RADIOLOGIST EVAL & MGMT  11/22/2021   MYOMECTOMY     MYOMECTOMY N/A 08/22/2017   Procedure: MYOMECTOMY;  Surgeon: Servando Salina, MD;  Location: [facility];  Service: Obstetrics;  Laterality: N/A;   NO PAST SURGERIES    :   Current Outpatient Medications:    Cholecalciferol (VITAMIN D3) 10 MCG (400 UNIT) tablet, Take by mouth., Disp: , Rfl:    fluticasone (FLONASE) 50 MCG/ACT nasal spray, Place into both nostrils., Disp: , Rfl:    ibuprofen (ADVIL) 600 MG tablet, Take 1 tablet (600 mg total) by mouth every 8 (eight) hours as needed., Disp: 20 tablet, Rfl: 0   Magnesium Oxide (MAG-OX 400 PO), Take 400 mg by mouth daily., Disp: , Rfl:    ondansetron (ZOFRAN ODT) 4 MG disintegrating tablet, Take 1 tablet (4 mg total) by mouth every 8 (eight) hours as needed for nausea or vomiting., Disp: 20 tablet, Rfl: 0   triamcinolone ointment (KENALOG) 0.1 %, Apply topically., Disp: , Rfl:    valACYclovir (VALTREX) 500 MG tablet, Take 1 tablet (500 mg total) by mouth 2 (two) times daily. For 3-5 days for outbreak, Disp: 30 tablet, Rfl: 5   vitamin C (ASCORBIC ACID) 500 MG tablet, Take 500 mg by mouth daily., Disp: , Rfl: :    Allergies  Allergen Reactions   Norco [Hydrocodone-Acetaminophen] Nausea And Vomiting   Oxycodone-Acetaminophen Nausea And Vomiting    FH:  SOCIAL HISTORY:  Review of Systems:  Physical Exam:  unknown if  currently breastfeeding.  HEENT: *** Lungs: *** Cardiac: *** Abdomen: *** GU: ***  Vascular: *** Lymph nodes: *** Neurologic: *** Skin: *** Musculoskeletal: ***  LABS:  No results for input(s): "WBC", "HGB", "HCT", "PLT" in the last 72 hours.  No results for input(s): "NA", "K", "CL", "CO2", "GLUCOSE", "BUN", "CREATININE", "CALCIUM" in the last 72 hours.    RADIOLOGY:  IR Radiologist Eval & Mgmt  Result  Date: 11/22/2021 Please refer to notes tab for details about interventional procedure. (Op Note)   Assessment and Plan:   ***    Ned Card, NP 11/28/2021, 2:58 PM

## 2021-11-29 ENCOUNTER — Encounter: Payer: Self-pay | Admitting: Nurse Practitioner

## 2021-11-29 ENCOUNTER — Inpatient Hospital Stay: Payer: BC Managed Care – PPO | Attending: Oncology

## 2021-11-29 ENCOUNTER — Inpatient Hospital Stay: Payer: BC Managed Care – PPO | Admitting: Nurse Practitioner

## 2021-11-29 VITALS — BP 126/82 | HR 75 | Temp 98.1°F | Resp 18 | Ht 61.5 in | Wt 135.2 lb

## 2021-11-29 DIAGNOSIS — F5089 Other specified eating disorder: Secondary | ICD-10-CM | POA: Insufficient documentation

## 2021-11-29 DIAGNOSIS — N920 Excessive and frequent menstruation with regular cycle: Secondary | ICD-10-CM | POA: Diagnosis not present

## 2021-11-29 DIAGNOSIS — Z800 Family history of malignant neoplasm of digestive organs: Secondary | ICD-10-CM | POA: Insufficient documentation

## 2021-11-29 DIAGNOSIS — D259 Leiomyoma of uterus, unspecified: Secondary | ICD-10-CM | POA: Diagnosis not present

## 2021-11-29 DIAGNOSIS — Z806 Family history of leukemia: Secondary | ICD-10-CM | POA: Insufficient documentation

## 2021-11-29 DIAGNOSIS — Z7901 Long term (current) use of anticoagulants: Secondary | ICD-10-CM | POA: Insufficient documentation

## 2021-11-29 DIAGNOSIS — Z86711 Personal history of pulmonary embolism: Secondary | ICD-10-CM | POA: Diagnosis not present

## 2021-11-29 DIAGNOSIS — Z79624 Long term (current) use of inhibitors of nucleotide synthesis: Secondary | ICD-10-CM | POA: Insufficient documentation

## 2021-11-29 DIAGNOSIS — Z8042 Family history of malignant neoplasm of prostate: Secondary | ICD-10-CM | POA: Insufficient documentation

## 2021-11-29 DIAGNOSIS — D509 Iron deficiency anemia, unspecified: Secondary | ICD-10-CM | POA: Insufficient documentation

## 2021-11-29 DIAGNOSIS — D72819 Decreased white blood cell count, unspecified: Secondary | ICD-10-CM | POA: Diagnosis not present

## 2021-11-29 DIAGNOSIS — R0609 Other forms of dyspnea: Secondary | ICD-10-CM | POA: Insufficient documentation

## 2021-11-29 DIAGNOSIS — E282 Polycystic ovarian syndrome: Secondary | ICD-10-CM | POA: Insufficient documentation

## 2021-11-29 LAB — CBC WITH DIFFERENTIAL (CANCER CENTER ONLY)
Abs Immature Granulocytes: 0.01 10*3/uL (ref 0.00–0.07)
Basophils Absolute: 0 10*3/uL (ref 0.0–0.1)
Basophils Relative: 1 %
Eosinophils Absolute: 0 10*3/uL (ref 0.0–0.5)
Eosinophils Relative: 1 %
HCT: 32.7 % — ABNORMAL LOW (ref 36.0–46.0)
Hemoglobin: 9.5 g/dL — ABNORMAL LOW (ref 12.0–15.0)
Immature Granulocytes: 0 %
Lymphocytes Relative: 33 %
Lymphs Abs: 1.3 10*3/uL (ref 0.7–4.0)
MCH: 18.2 pg — ABNORMAL LOW (ref 26.0–34.0)
MCHC: 29.1 g/dL — ABNORMAL LOW (ref 30.0–36.0)
MCV: 62.6 fL — ABNORMAL LOW (ref 80.0–100.0)
Monocytes Absolute: 0.4 10*3/uL (ref 0.1–1.0)
Monocytes Relative: 9 %
Neutro Abs: 2.2 10*3/uL (ref 1.7–7.7)
Neutrophils Relative %: 56 %
Platelet Count: 249 10*3/uL (ref 150–400)
RBC: 5.22 MIL/uL — ABNORMAL HIGH (ref 3.87–5.11)
RDW: 18.4 % — ABNORMAL HIGH (ref 11.5–15.5)
WBC Count: 3.9 10*3/uL — ABNORMAL LOW (ref 4.0–10.5)
nRBC: 0 % (ref 0.0–0.2)

## 2021-11-29 LAB — SAVE SMEAR(SSMR), FOR PROVIDER SLIDE REVIEW

## 2021-11-30 ENCOUNTER — Other Ambulatory Visit: Payer: Self-pay | Admitting: *Deleted

## 2021-11-30 MED ORDER — FERROUS SULFATE 325 (65 FE) MG PO TBEC: 325.0000 mg | DELAYED_RELEASE_TABLET | Freq: Two times a day (BID) | ORAL | 3 refills | Status: AC

## 2021-12-04 ENCOUNTER — Ambulatory Visit (HOSPITAL_COMMUNITY)
Admission: RE | Admit: 2021-12-04 | Discharge: 2021-12-04 | Disposition: A | Discharge status: Home / Self Care | Payer: BC Managed Care – PPO | Source: Ambulatory Visit | Attending: Interventional Radiology | Admitting: Interventional Radiology

## 2021-12-04 DIAGNOSIS — D259 Leiomyoma of uterus, unspecified: Secondary | ICD-10-CM | POA: Diagnosis not present

## 2021-12-04 DIAGNOSIS — D251 Intramural leiomyoma of uterus: Secondary | ICD-10-CM | POA: Diagnosis not present

## 2021-12-04 DIAGNOSIS — D1800 Hemangioma unspecified site: Secondary | ICD-10-CM | POA: Diagnosis not present

## 2021-12-04 DIAGNOSIS — N83202 Unspecified ovarian cyst, left side: Secondary | ICD-10-CM | POA: Diagnosis not present

## 2021-12-04 MED ORDER — GADOBUTROL 1 MMOL/ML IV SOLN
6.0000 mL | Freq: Once | INTRAVENOUS | Status: AC | PRN
  Administered 2021-12-04: 6 mL via INTRAVENOUS

## 2021-12-11 DIAGNOSIS — D259 Leiomyoma of uterus, unspecified: Secondary | ICD-10-CM | POA: Diagnosis not present

## 2021-12-11 DIAGNOSIS — N920 Excessive and frequent menstruation with regular cycle: Secondary | ICD-10-CM | POA: Diagnosis not present

## 2021-12-18 ENCOUNTER — Other Ambulatory Visit (HOSPITAL_BASED_OUTPATIENT_CLINIC_OR_DEPARTMENT_OTHER): Payer: Self-pay

## 2021-12-18 ENCOUNTER — Ambulatory Visit: Payer: BC Managed Care – PPO | Admitting: Internal Medicine

## 2021-12-18 DIAGNOSIS — D509 Iron deficiency anemia, unspecified: Secondary | ICD-10-CM

## 2021-12-18 LAB — OCCULT BLOOD X 1 CARD TO LAB, STOOL
Fecal Occult Bld: NEGATIVE
Fecal Occult Bld: NEGATIVE
Fecal Occult Bld: NEGATIVE

## 2021-12-18 NOTE — Progress Notes (Deleted)
No chief complaint on file.   HPI: Jessica Dudley 46 y.o. come in for   Saw dr Mickle Asper  now to take iron bid  GYNE:  ROS: See pertinent positives and negatives per HPI.  Past Medical History:  Diagnosis Date   Hx of pulmonary embolus 07/23/2017   In vitro fertilization 07/02/2016   Hematology supervision in view of previous PE 12/11/15   Iron deficiency anemia    PCOS (polycystic ovarian syndrome)    Pregnancy, high-risk 07/23/2017   Uterine fibroid    Wears glasses     Family History  Problem Relation Age of Onset   Hypertension Mother    Prostate cancer Paternal Uncle    Diabetes Maternal Grandmother    Leukemia Cousin    Brain cancer Cousin    Deep vein thrombosis Father    Varicose Veins Father     Social History   Socioeconomic History   Marital status: Divorced    Spouse name: Not on file   Number of children: Not on file   Years of education: Not on file   Highest education level: Not on file  Occupational History   Not on file  Tobacco Use   Smoking status: Never   Smokeless tobacco: Never  Substance and Sexual Activity   Alcohol use: No   Drug use: No   Sexual activity: Yes    Birth control/protection: None  Other Topics Concern   Not on file  Social History Narrative   Divorced  Remarried   In vitro to deliver in April   On lovenox      Regular exercise- no   HH of 3 with daughter no pets       daughter    Works [employer]   40 hours    MOM ruby [place]ix   Had 65 yo at home goes to day care 2022   Social Determinants of Health   Financial Resource Strain: Not on file  Food Insecurity: Not on file  Transportation Needs: Not on file  Physical Activity: Not on file  Stress: Not on file  Social Connections: Not on file    Outpatient Medications Prior to Visit  Medication Sig Dispense Refill   Cholecalciferol (VITAMIN D3) 10 MCG (400 UNIT) tablet Take by mouth.     ferrous sulfate 325 (65 FE) MG EC tablet Take 1 tablet (325 mg total) by mouth 2  (two) times daily with a meal.  3   fluticasone (FLONASE) 50 MCG/ACT nasal spray Place into both nostrils.     ibuprofen (ADVIL) 600 MG tablet Take 1 tablet (600 mg total) by mouth every 8 (eight) hours as needed. 20 tablet 0   Magnesium Oxide (MAG-OX 400 PO) Take 400 mg by mouth daily.     ondansetron (ZOFRAN ODT) 4 MG disintegrating tablet Take 1 tablet (4 mg total) by mouth every 8 (eight) hours as needed for nausea or vomiting. (Patient not taking: Reported on 11/29/2021) 20 tablet 0   triamcinolone ointment (KENALOG) 0.1 % Apply topically.     valACYclovir (VALTREX) 500 MG tablet Take 1 tablet (500 mg total) by mouth 2 (two) times daily. For 3-5 days for outbreak 30 tablet 5   vitamin C (ASCORBIC ACID) 500 MG tablet Take 500 mg by mouth daily.     VITAMIN E COMPLEX PO Vitamin E     No facility-administered medications prior to visit.     EXAM:  There were no vitals taken for this visit.  There is no height or weight on file to calculate BMI.  GENERAL: vitals reviewed and listed above, alert, oriented, appears well hydrated and in no acute distress HEENT: atraumatic, conjunctiva  clear, no obvious abnormalities on inspection of external nose and ears OP : no lesion edema or exudate  NECK: no obvious masses on inspection palpation  LUNGS: clear to auscultation bilaterally, no wheezes, rales or rhonchi, good air movement CV: HRRR, no clubbing cyanosis or  peripheral edema nl cap refill  MS: moves all extremities without noticeable focal  abnormality PSYCH: pleasant and cooperative, no obvious depression or anxiety Lab Results  Component Value Date   WBC 3.9 (L) 11/29/2021   HGB 9.5 (L) 11/29/2021   HCT 32.7 (L) 11/29/2021   PLT 249 11/29/2021   GLUCOSE 91 06/12/2021   CHOL 159 06/12/2021   TRIG 54.0 06/12/2021   HDL 55.50 06/12/2021   LDLCALC 93 06/12/2021   ALT 13 06/12/2021   AST 14 06/12/2021   NA 137 06/12/2021   K 3.6 06/12/2021   CL 105 06/12/2021   CREATININE 0.89  06/12/2021   BUN 13 06/12/2021   CO2 26 06/12/2021   TSH 0.67 06/12/2021   INR 1.08 12/11/2015   HGBA1C 5.6 06/12/2021   BP Readings from Last 3 Encounters:  11/29/21 126/82  11/22/21 115/76  06/12/21 120/76    ASSESSMENT AND PLAN:  Discussed the following assessment and plan:  No diagnosis found.  -Patient advised to return or notify health care team  if  new concerns arise.  There are no Patient Instructions on file for this visit.   Standley Brooking. Marieanne Marxen M.D.

## 2021-12-19 ENCOUNTER — Telehealth: Payer: Self-pay

## 2021-12-19 DIAGNOSIS — R140 Abdominal distension (gaseous): Secondary | ICD-10-CM | POA: Diagnosis not present

## 2021-12-19 DIAGNOSIS — Z1211 Encounter for screening for malignant neoplasm of colon: Secondary | ICD-10-CM | POA: Diagnosis not present

## 2021-12-19 DIAGNOSIS — D509 Iron deficiency anemia, unspecified: Secondary | ICD-10-CM | POA: Diagnosis not present

## 2021-12-19 DIAGNOSIS — K5904 Chronic idiopathic constipation: Secondary | ICD-10-CM | POA: Diagnosis not present

## 2021-12-19 NOTE — Telephone Encounter (Signed)
Jessica Dudley expresses understanding and had no further questions or concerns.

## 2021-12-19 NOTE — Telephone Encounter (Signed)
-----   Message from Owens Shark, NP sent at 12/19/2021  8:23 AM EDT ----- Please let her know stool cards are negative for blood.  Follow-up as scheduled.

## 2021-12-21 ENCOUNTER — Other Ambulatory Visit (HOSPITAL_COMMUNITY): Payer: Self-pay | Admitting: Interventional Radiology

## 2021-12-21 DIAGNOSIS — D259 Leiomyoma of uterus, unspecified: Secondary | ICD-10-CM

## 2021-12-26 ENCOUNTER — Ambulatory Visit
Admission: RE | Admit: 2021-12-26 | Discharge: 2021-12-26 | Disposition: A | Discharge status: Home / Self Care | Payer: BC Managed Care – PPO | Source: Ambulatory Visit | Attending: Emergency Medicine | Admitting: Emergency Medicine

## 2021-12-26 VITALS — BP 110/75 | HR 66 | Temp 98.1°F | Resp 18

## 2021-12-26 DIAGNOSIS — B3731 Acute candidiasis of vulva and vagina: Secondary | ICD-10-CM | POA: Diagnosis not present

## 2021-12-26 DIAGNOSIS — N760 Acute vaginitis: Secondary | ICD-10-CM | POA: Diagnosis not present

## 2021-12-26 DIAGNOSIS — N898 Other specified noninflammatory disorders of vagina: Secondary | ICD-10-CM | POA: Insufficient documentation

## 2021-12-26 LAB — POCT URINALYSIS DIP (MANUAL ENTRY)
Bilirubin, UA: NEGATIVE
Blood, UA: NEGATIVE
Glucose, UA: NEGATIVE mg/dL
Ketones, POC UA: NEGATIVE mg/dL
Nitrite, UA: NEGATIVE
Protein Ur, POC: NEGATIVE mg/dL
Spec Grav, UA: 1.02 (ref 1.010–1.025)
Urobilinogen, UA: 0.2 E.U./dL
pH, UA: 7 (ref 5.0–8.0)

## 2021-12-26 LAB — POCT URINE PREGNANCY: Preg Test, Ur: NEGATIVE

## 2021-12-26 NOTE — ED Provider Notes (Signed)
[facility]    CSN: [id number] Arrival date & time: 12/26/21  1849    HISTORY   Chief Complaint  Patient presents with   Vaginal Discharge    Might have bacteria infection from my Endometrial biopsy - Entered by patient   HPI Jessica Dudley is a pleasant, 46 y.o. female who presents to urgent care today. The patient states she has had vaginal irritation including itching and thick white discharge for the past 4 days.  The patient states she had an endometrial biopsy on 12/11/21.  Patient states she has not tried any attempts to remedy her symptoms.  The history is provided by the patient.   Past Medical History:  Diagnosis Date   Hx of pulmonary embolus 07/23/2017   In vitro fertilization 07/02/2016   Hematology supervision in view of previous PE 12/11/15   Iron deficiency anemia    PCOS (polycystic ovarian syndrome)    Pregnancy, high-risk 07/23/2017   Uterine fibroid    Wears glasses    Patient Active Problem List   Diagnosis Date Noted   Cesarean delivery delivered 4/18 08/22/2017   Postpartum care following cesarean delivery (4/18) 08/22/2017   Hx of pulmonary embolus 07/23/2017   In vitro fertilization 07/02/2016   PE (pulmonary thromboembolism) ([place]) 12/11/2015   Leiomyoma 11/29/2015   GENITAL HERPES, HX OF 11/03/2009   Past Surgical History:  Procedure Laterality Date   CESAREAN SECTION WITH BILATERAL TUBAL LIGATION N/A 08/22/2017   Procedure: PRIMARY CESAREAN SECTION WITH BILATERAL TUBAL LIGATION;  Surgeon: Servando Salina, MD;  Location: [place];  Service: Obstetrics;  Laterality: N/A;  Classical incision EDD: 09/12/17 Allergy: Vicodin   CHROMOPERTUBATION N/A 11/29/2015   Procedure: ABDOMINAL MYOMECTOMY;  Surgeon: Governor Specking, MD;  Location: [place];  Service: Gynecology;  Laterality: N/A;   IR RADIOLOGIST EVAL & MGMT  11/22/2021   MYOMECTOMY     MYOMECTOMY N/A 08/22/2017   Procedure: MYOMECTOMY;  Surgeon: Servando Salina, MD;  Location: [place];  Service: Obstetrics;  Laterality: N/A;   NO PAST SURGERIES     OB History     Gravida  2   Para  2   Term  2   Preterm      AB      Living  2      SAB      IAB      Ectopic      Multiple  0   Live Births  2          Home Medications    Prior to Admission medications   Medication Sig Start Date End Date Taking? Authorizing Provider  Cholecalciferol (VITAMIN D3) 10 MCG (400 UNIT) tablet Take by mouth.    [provider]  ferrous sulfate 325 (65 FE) MG EC tablet Take 1 tablet (325 mg total) by mouth 2 (two) times daily with a meal. 11/29/21   Ladell Pier, MD  Magnesium Oxide (MAG-OX 400 PO) Take 400 mg by mouth daily.    [provider]  valACYclovir (VALTREX) 500 MG tablet Take 1 tablet (500 mg total) by mouth 2 (two) times daily. For 3-5 days for outbreak 02/22/16   Panosh, Standley Brooking, MD  vitamin C (ASCORBIC ACID) 500 MG tablet Take 500 mg by mouth daily.    [provider]  VITAMIN E COMPLEX PO Vitamin E    [provider]    Family History Family History  Problem Relation Age of Onset  Hypertension Mother    Prostate cancer Paternal Uncle    Diabetes Maternal Grandmother    Leukemia Cousin    Brain cancer Cousin    Deep vein thrombosis Father    Varicose Veins Father    Social History Social History   Tobacco Use   Smoking status: Never   Smokeless tobacco: Never  Substance Use Topics   Alcohol use: No   Drug use: No   Allergies   Norco [hydrocodone-acetaminophen] and Oxycodone-acetaminophen  Review of Systems Review of Systems Pertinent findings revealed after performing a 14 point review of systems has been noted in the history of present illness.  Physical Exam Triage Vital Signs ED Triage Vitals  Enc Vitals Group     BP 03/03/21 0827 (!) 147/82     Pulse Rate 03/03/21 0827 72     Resp 03/03/21 0827 18     Temp 03/03/21 0827 98.3 F (36.8 C)      Temp Source 03/03/21 0827 Oral     SpO2 03/03/21 0827 98 %     Weight --      Height --      Head Circumference --      Peak Flow --      Pain Score 03/03/21 0826 5     Pain Loc --      Pain Edu? --      Excl. in [place]? --   No data found.  Updated Vital Signs BP 110/75 (BP Location: Left Arm)   Pulse 66   Temp 98.1 F (36.7 C) (Oral)   Resp 18   LMP 12/15/2021   SpO2 100%   Breastfeeding No   Physical Exam Vitals and nursing note reviewed.  Constitutional:      General: She is not in acute distress.    Appearance: Normal appearance. She is not ill-appearing.  HENT:     Head: Normocephalic and atraumatic.  Eyes:     General: Lids are normal.        Right eye: No discharge.        Left eye: No discharge.     Extraocular Movements: Extraocular movements intact.     Conjunctiva/sclera: Conjunctivae normal.     Right eye: Right conjunctiva is not injected.     Left eye: Left conjunctiva is not injected.  Neck:     Trachea: Trachea and phonation normal.  Cardiovascular:     Rate and Rhythm: Normal rate and regular rhythm.     Pulses: Normal pulses.     Heart sounds: Normal heart sounds. No murmur heard.    No friction rub. No gallop.  Pulmonary:     Effort: Pulmonary effort is normal. No accessory muscle usage, prolonged expiration or respiratory distress.     Breath sounds: Normal breath sounds. No stridor, decreased air movement or transmitted upper airway sounds. No decreased breath sounds, wheezing, rhonchi or rales.  Chest:     Chest wall: No tenderness.  Genitourinary:    Comments: Patient politely declines pelvic exam today, patient provided a vaginal swab for testing. Musculoskeletal:        General: Normal range of motion.     Cervical back: Normal range of motion and neck supple. Normal range of motion.  Lymphadenopathy:     Cervical: No cervical adenopathy.  Skin:    General: Skin is warm and dry.     Findings: No erythema or rash.  Neurological:      General: No focal deficit present.  Mental Status: She is alert and oriented to person, place, and time.  Psychiatric:        Mood and Affect: Mood normal.        Behavior: Behavior normal.     Visual Acuity Right Eye Distance:   Left Eye Distance:   Bilateral Distance:    Right Eye Near:   Left Eye Near:    Bilateral Near:     UC Couse / Diagnostics / Procedures:     Radiology No results found.  Procedures Procedures (including critical care time) EKG  Pending results:  Labs Reviewed  POCT URINALYSIS DIP (MANUAL ENTRY) - Abnormal; Notable for the following components:      Result Value   Leukocytes, UA Small (1+) (*)    All other components within normal limits  POCT URINE PREGNANCY  CERVICOVAGINAL ANCILLARY ONLY    Medications Ordered in UC: Medications - No data to display  UC Diagnoses / Final Clinical Impressions(s)   I have reviewed the triage vital signs and the nursing notes.  Pertinent labs & imaging results that were available during my care of the patient were reviewed by me and considered in my medical decision making (see chart for details).    Final diagnoses:  Problematic vaginal discharge  Vulvovaginitis    STD screening was performed, patient advised that the results be posted to their MyChart and if any of the results are positive, they will be notified by phone, further treatment will be provided as indicated based on results of STD screening. Patient was advised to abstain from sexual intercourse until that they receive the results of their STD testing.  Patient was also advised to use condoms to protect themselves from STD exposure. Urinalysis today was normal. Urine pregnancy test was negative. Return precautions advised.  Drug allergies reviewed, all questions addressed.     ED Prescriptions   None    PDMP not reviewed this encounter.  Disposition Upon Discharge:  Condition: stable for discharge home  Patient presented with  concern for an acute illness with associated systemic symptoms and significant discomfort requiring urgent management. In my opinion, this is a condition that a prudent lay person (someone who possesses an average knowledge of health and medicine) may potentially expect to result in complications if not addressed urgently such as respiratory distress, impairment of bodily function or dysfunction of bodily organs.   As such, the patient has been evaluated and assessed, work-up was performed and treatment was provided in alignment with urgent care protocols and evidence based medicine.  Patient/parent/caregiver has been advised that the patient may require follow up for further testing and/or treatment if the symptoms continue in spite of treatment, as clinically indicated and appropriate.  Routine symptom specific, illness specific and/or disease specific instructions were discussed with the patient and/or caregiver at length.  Prevention strategies for avoiding STD exposure were also discussed.  The patient will follow up with their current PCP if and as advised. If the patient does not currently have a PCP we will assist them in obtaining one.   The patient may need specialty follow up if the symptoms continue, in spite of conservative treatment and management, for further workup, evaluation, consultation and treatment as clinically indicated and appropriate.  Patient/parent/caregiver verbalized understanding and agreement of plan as discussed.  All questions were addressed during visit.  Please see discharge instructions below for further details of plan.  Discharge Instructions:   Discharge Instructions      Your urinalysis today  was unremarkable and not concerning for urinary tract infection.  The results of your vaginal swab should be available in the next 2 to 3 days and will be posted to your MyChart account.  If there is a positive finding you will be contacted by phone and provided with  prescriptions for treatment.  In the meantime, for comfort you can consider applying Desitin cream or over-the-counter Lotrimin cream to your vaginal area to relieve the burning and discomfort.  Thank you for visiting urgent care today.      This office note has been dictated using Museum/gallery curator.  Unfortunately, this method of dictation can sometimes lead to typographical or grammatical errors.  I apologize for your inconvenience in advance if this occurs.  Please do not hesitate to reach out to me if clarification is needed.       Lynden Oxford Scales, [place] 12/26/21 1933

## 2021-12-26 NOTE — ED Triage Notes (Signed)
The patient states she has had vaginal irritation (itching and discharge). The patient states she had an endometrial biopsy on 12/11/21.   Started: Over the weekend  Home interventions: none

## 2021-12-26 NOTE — Discharge Instructions (Addendum)
Your urinalysis today was unremarkable and not concerning for urinary tract infection.  The results of your vaginal swab should be available in the next 2 to 3 days and will be posted to your MyChart account.  If there is a positive finding you will be contacted by phone and provided with prescriptions for treatment.  In the meantime, for comfort you can consider applying Desitin cream or over-the-counter Lotrimin cream to your vaginal area to relieve the burning and discomfort.  Thank you for visiting urgent care today.

## 2021-12-28 ENCOUNTER — Telehealth (HOSPITAL_COMMUNITY): Payer: Self-pay | Admitting: Emergency Medicine

## 2021-12-28 LAB — CERVICOVAGINAL ANCILLARY ONLY
Bacterial Vaginitis (gardnerella): POSITIVE — AB
Candida Glabrata: NEGATIVE
Candida Vaginitis: POSITIVE — AB
Chlamydia: NEGATIVE
Comment: NEGATIVE
Comment: NEGATIVE
Comment: NEGATIVE
Comment: NEGATIVE
Comment: NEGATIVE
Comment: NORMAL
Neisseria Gonorrhea: NEGATIVE
Trichomonas: NEGATIVE

## 2021-12-28 MED ORDER — METRONIDAZOLE 500 MG PO TABS: 500.0000 mg | ORAL_TABLET | Freq: Two times a day (BID) | ORAL | 0 refills | Status: DC

## 2021-12-28 MED ORDER — FLUCONAZOLE 150 MG PO TABS: 150.0000 mg | ORAL_TABLET | Freq: Once | ORAL | 0 refills | Status: AC

## 2022-01-01 DIAGNOSIS — K648 Other hemorrhoids: Secondary | ICD-10-CM | POA: Diagnosis not present

## 2022-01-01 DIAGNOSIS — Z1211 Encounter for screening for malignant neoplasm of colon: Secondary | ICD-10-CM | POA: Diagnosis not present

## 2022-01-10 ENCOUNTER — Inpatient Hospital Stay: Payer: BC Managed Care – PPO | Admitting: Oncology

## 2022-01-10 ENCOUNTER — Inpatient Hospital Stay: Payer: BC Managed Care – PPO | Attending: Oncology

## 2022-01-10 VITALS — BP 133/81 | HR 71 | Temp 98.4°F | Resp 16 | Wt 137.0 lb

## 2022-01-10 DIAGNOSIS — D259 Leiomyoma of uterus, unspecified: Secondary | ICD-10-CM | POA: Insufficient documentation

## 2022-01-10 DIAGNOSIS — Z86711 Personal history of pulmonary embolism: Secondary | ICD-10-CM | POA: Insufficient documentation

## 2022-01-10 DIAGNOSIS — D500 Iron deficiency anemia secondary to blood loss (chronic): Secondary | ICD-10-CM | POA: Insufficient documentation

## 2022-01-10 DIAGNOSIS — D509 Iron deficiency anemia, unspecified: Secondary | ICD-10-CM | POA: Diagnosis not present

## 2022-01-10 DIAGNOSIS — Z86018 Personal history of other benign neoplasm: Secondary | ICD-10-CM | POA: Diagnosis not present

## 2022-01-10 DIAGNOSIS — N920 Excessive and frequent menstruation with regular cycle: Secondary | ICD-10-CM | POA: Diagnosis not present

## 2022-01-10 DIAGNOSIS — K5900 Constipation, unspecified: Secondary | ICD-10-CM | POA: Insufficient documentation

## 2022-01-10 DIAGNOSIS — E282 Polycystic ovarian syndrome: Secondary | ICD-10-CM | POA: Diagnosis not present

## 2022-01-10 LAB — CBC WITH DIFFERENTIAL (CANCER CENTER ONLY)
Abs Immature Granulocytes: 0.01 10*3/uL (ref 0.00–0.07)
Basophils Absolute: 0 10*3/uL (ref 0.0–0.1)
Basophils Relative: 1 %
Eosinophils Absolute: 0 10*3/uL (ref 0.0–0.5)
Eosinophils Relative: 1 %
HCT: 31.4 % — ABNORMAL LOW (ref 36.0–46.0)
Hemoglobin: 9.2 g/dL — ABNORMAL LOW (ref 12.0–15.0)
Immature Granulocytes: 0 %
Lymphocytes Relative: 24 %
Lymphs Abs: 1.2 10*3/uL (ref 0.7–4.0)
MCH: 17.9 pg — ABNORMAL LOW (ref 26.0–34.0)
MCHC: 29.3 g/dL — ABNORMAL LOW (ref 30.0–36.0)
MCV: 61.2 fL — ABNORMAL LOW (ref 80.0–100.0)
Monocytes Absolute: 0.3 10*3/uL (ref 0.1–1.0)
Monocytes Relative: 7 %
Neutro Abs: 3.5 10*3/uL (ref 1.7–7.7)
Neutrophils Relative %: 67 %
Platelet Count: 227 10*3/uL (ref 150–400)
RBC: 5.13 MIL/uL — ABNORMAL HIGH (ref 3.87–5.11)
RDW: 19.7 % — ABNORMAL HIGH (ref 11.5–15.5)
WBC Count: 5.1 10*3/uL (ref 4.0–10.5)
nRBC: 0 % (ref 0.0–0.2)

## 2022-01-10 LAB — URINALYSIS, COMPLETE (UACMP) WITH MICROSCOPIC
Bilirubin Urine: NEGATIVE
Glucose, UA: NEGATIVE mg/dL
Ketones, ur: NEGATIVE mg/dL
Nitrite: NEGATIVE
Protein, ur: 30 mg/dL — AB
Specific Gravity, Urine: 1.027 (ref 1.005–1.030)
pH: 5.5 (ref 5.0–8.0)

## 2022-01-10 LAB — FERRITIN: Ferritin: 3 ng/mL — ABNORMAL LOW (ref 11–307)

## 2022-01-10 NOTE — Progress Notes (Signed)
  [facility] OFFICE PROGRESS NOTE   Diagnosis: Iron deficiency anemia  INTERVAL HISTORY:   Jessica Dudley returns as scheduled.  She reports undergoing a colonoscopy last week by Dr. Collene Mares.  She resumed iron after the colonoscopy.  She is taking iron twice daily.  Iron causes constipation.  She continues to have menorrhagia.  Her menstrual cycle started today.  She is scheduled for a uterine artery embolization procedure later this month.  Objective:  Vital signs in last 24 hours:  Blood pressure 133/81, pulse 71, temperature 98.4 F (36.9 C), temperature source Oral, resp. rate 16, last menstrual period 12/15/2021, SpO2 100 %.    Resp: Lungs clear bilaterally Cardio: Regular rate and rhythm GI: No hepatosplenomegaly, fibroids palpable at the low mid abdomen Vascular: No leg edema   Lab Results:  Lab Results  Component Value Date   WBC 3.9 (L) 11/29/2021   HGB 9.5 (L) 11/29/2021   HCT 32.7 (L) 11/29/2021   MCV 62.6 (L) 11/29/2021   PLT 249 11/29/2021   NEUTROABS 2.2 11/29/2021    CMP  Lab Results  Component Value Date   NA 137 06/12/2021   K 3.6 06/12/2021   CL 105 06/12/2021   CO2 26 06/12/2021   GLUCOSE 91 06/12/2021   BUN 13 06/12/2021   CREATININE 0.89 06/12/2021   CALCIUM 9.5 06/12/2021   PROT 7.4 06/12/2021   ALBUMIN 4.6 06/12/2021   AST 14 06/12/2021   ALT 13 06/12/2021   ALKPHOS 39 06/12/2021   BILITOT 0.7 06/12/2021   GFRNONAA >60 07/16/2017   GFRAA >60 07/16/2017   Ferritin-3  Medications: I have reviewed the patient's current medications.   Assessment/Plan: Iron deficiency anemia Menorrhagia secondary to uterine fibroids History of pulmonary embolus 12/11/2015, occurred after an abdominal myomectomy to resect uterine fibroids on 11/29/2015, completed Xarelto x3 months PCOS History of mild leukopenia, likely benign normal variant    Disposition: Jessica Dudley appears well.  She has persistent iron deficiency anemia secondary to  menorrhagia.  She is scheduled for a uterine artery embolization procedure later this month.  I recommended she continue ferrous sulfate.  She will begin Senokot and MiraLAX for constipation.  She may need IV iron therapy if the iron deficiency does not correct with ferrous sulfate and the uterine embolization.  She will return for an office and lab visit in 4 weeks.  Betsy Coder, MD  01/10/2022  11:26 AM

## 2022-01-10 NOTE — Patient Instructions (Signed)
Art Senna twice daily and miralax once daily. Back down in dose if your stools are too loose.

## 2022-01-11 ENCOUNTER — Other Ambulatory Visit (HOSPITAL_COMMUNITY): Payer: Self-pay | Admitting: Interventional Radiology

## 2022-01-11 DIAGNOSIS — D259 Leiomyoma of uterus, unspecified: Secondary | ICD-10-CM

## 2022-01-26 ENCOUNTER — Other Ambulatory Visit: Payer: Self-pay | Admitting: Radiology

## 2022-01-26 DIAGNOSIS — D259 Leiomyoma of uterus, unspecified: Secondary | ICD-10-CM

## 2022-01-29 ENCOUNTER — Encounter (HOSPITAL_COMMUNITY): Payer: Self-pay

## 2022-01-29 ENCOUNTER — Observation Stay (HOSPITAL_COMMUNITY)
Admission: RE | Admit: 2022-01-29 | Discharge: 2022-01-30 | Disposition: A | Discharge status: Home / Self Care | Payer: BC Managed Care – PPO | Source: Ambulatory Visit | Attending: Interventional Radiology | Admitting: Interventional Radiology

## 2022-01-29 ENCOUNTER — Other Ambulatory Visit (HOSPITAL_COMMUNITY): Payer: Self-pay | Admitting: Interventional Radiology

## 2022-01-29 ENCOUNTER — Ambulatory Visit (HOSPITAL_COMMUNITY)
Admission: RE | Admit: 2022-01-29 | Discharge: 2022-01-29 | Disposition: A | Discharge status: Home / Self Care | Payer: BC Managed Care – PPO | Source: Ambulatory Visit | Attending: Interventional Radiology | Admitting: Interventional Radiology

## 2022-01-29 ENCOUNTER — Other Ambulatory Visit: Payer: Self-pay

## 2022-01-29 DIAGNOSIS — Z98891 History of uterine scar from previous surgery: Secondary | ICD-10-CM | POA: Diagnosis not present

## 2022-01-29 DIAGNOSIS — D259 Leiomyoma of uterus, unspecified: Secondary | ICD-10-CM | POA: Diagnosis not present

## 2022-01-29 DIAGNOSIS — N920 Excessive and frequent menstruation with regular cycle: Secondary | ICD-10-CM | POA: Insufficient documentation

## 2022-01-29 DIAGNOSIS — D251 Intramural leiomyoma of uterus: Secondary | ICD-10-CM | POA: Diagnosis not present

## 2022-01-29 DIAGNOSIS — Z79899 Other long term (current) drug therapy: Secondary | ICD-10-CM | POA: Diagnosis not present

## 2022-01-29 DIAGNOSIS — N939 Abnormal uterine and vaginal bleeding, unspecified: Secondary | ICD-10-CM | POA: Diagnosis not present

## 2022-01-29 DIAGNOSIS — E282 Polycystic ovarian syndrome: Secondary | ICD-10-CM | POA: Insufficient documentation

## 2022-01-29 DIAGNOSIS — D509 Iron deficiency anemia, unspecified: Secondary | ICD-10-CM | POA: Insufficient documentation

## 2022-01-29 DIAGNOSIS — Z86711 Personal history of pulmonary embolism: Secondary | ICD-10-CM | POA: Insufficient documentation

## 2022-01-29 DIAGNOSIS — D219 Benign neoplasm of connective and other soft tissue, unspecified: Secondary | ICD-10-CM | POA: Diagnosis present

## 2022-01-29 HISTORY — PX: IR EMBO TUMOR ORGAN ISCHEMIA INFARCT INC GUIDE ROADMAPPING: IMG5449

## 2022-01-29 HISTORY — PX: IR ANGIOGRAM SELECTIVE EACH ADDITIONAL VESSEL: IMG667

## 2022-01-29 HISTORY — PX: IR ANGIOGRAM PELVIS SELECTIVE OR SUPRASELECTIVE: IMG661

## 2022-01-29 HISTORY — PX: IR US GUIDE VASC ACCESS RIGHT: IMG2390

## 2022-01-29 LAB — CBC WITH DIFFERENTIAL/PLATELET
Abs Immature Granulocytes: 0.02 10*3/uL (ref 0.00–0.07)
Basophils Absolute: 0.1 10*3/uL (ref 0.0–0.1)
Basophils Relative: 2 %
Eosinophils Absolute: 0 10*3/uL (ref 0.0–0.5)
Eosinophils Relative: 0 %
HCT: 31.3 % — ABNORMAL LOW (ref 36.0–46.0)
Hemoglobin: 9.2 g/dL — ABNORMAL LOW (ref 12.0–15.0)
Immature Granulocytes: 1 %
Lymphocytes Relative: 34 %
Lymphs Abs: 1.4 10*3/uL (ref 0.7–4.0)
MCH: 18.1 pg — ABNORMAL LOW (ref 26.0–34.0)
MCHC: 29.4 g/dL — ABNORMAL LOW (ref 30.0–36.0)
MCV: 61.7 fL — ABNORMAL LOW (ref 80.0–100.0)
Monocytes Absolute: 0.4 10*3/uL (ref 0.1–1.0)
Monocytes Relative: 10 %
Neutro Abs: 2.3 10*3/uL (ref 1.7–7.7)
Neutrophils Relative %: 53 %
Platelets: 235 10*3/uL (ref 150–400)
RBC: 5.07 MIL/uL (ref 3.87–5.11)
RDW: 19.5 % — ABNORMAL HIGH (ref 11.5–15.5)
WBC: 4.2 10*3/uL (ref 4.0–10.5)
nRBC: 0 % (ref 0.0–0.2)

## 2022-01-29 LAB — CBC
HCT: 28.2 % — ABNORMAL LOW (ref 36.0–46.0)
Hemoglobin: 8 g/dL — ABNORMAL LOW (ref 12.0–15.0)
MCH: 17.9 pg — ABNORMAL LOW (ref 26.0–34.0)
MCHC: 28.4 g/dL — ABNORMAL LOW (ref 30.0–36.0)
MCV: 63.2 fL — ABNORMAL LOW (ref 80.0–100.0)
Platelets: 223 10*3/uL (ref 150–400)
RBC: 4.46 MIL/uL (ref 3.87–5.11)
RDW: 19.2 % — ABNORMAL HIGH (ref 11.5–15.5)
WBC: 7.8 10*3/uL (ref 4.0–10.5)
nRBC: 0 % (ref 0.0–0.2)

## 2022-01-29 LAB — BASIC METABOLIC PANEL
Anion gap: 7 (ref 5–15)
BUN: 15 mg/dL (ref 6–20)
CO2: 23 mmol/L (ref 22–32)
Calcium: 9.4 mg/dL (ref 8.9–10.3)
Chloride: 110 mmol/L (ref 98–111)
Creatinine, Ser: 0.76 mg/dL (ref 0.44–1.00)
GFR, Estimated: >60 mL/min (ref >60)
Glucose, Bld: 83 mg/dL (ref 70–99)
Potassium: 3.7 mmol/L (ref 3.5–5.1)
Sodium: 140 mmol/L (ref 135–145)

## 2022-01-29 LAB — PROTIME-INR
INR: 1 (ref 0.8–1.2)
Prothrombin Time: 13.3 seconds (ref 11.4–15.2)

## 2022-01-29 LAB — HCG, SERUM, QUALITATIVE: Preg, Serum: NEGATIVE

## 2022-01-29 LAB — CREATININE, SERUM
Creatinine, Ser: 0.79 mg/dL (ref 0.44–1.00)
GFR, Estimated: >60 mL/min (ref >60)

## 2022-01-29 MED ORDER — VALACYCLOVIR HCL 500 MG PO TABS
500.0000 mg | ORAL_TABLET | Freq: Two times a day (BID) | ORAL | Status: DC
  Administered 2022-01-29 – 2022-01-30 (×2): 500 mg via ORAL
  Filled 2022-01-29 (×2): qty 1

## 2022-01-29 MED ORDER — FENTANYL CITRATE (PF) 100 MCG/2ML IJ SOLN
INTRAMUSCULAR | Status: AC | PRN
  Administered 2022-01-29: 50 ug via INTRAVENOUS

## 2022-01-29 MED ORDER — NITROGLYCERIN 1 MG/10 ML FOR IR/CATH LAB
INTRA_ARTERIAL | Status: AC | PRN
  Administered 2022-01-29: 200 ug via INTRA_ARTERIAL

## 2022-01-29 MED ORDER — IOHEXOL 300 MG/ML  SOLN
100.0000 mL | Freq: Once | INTRAMUSCULAR | Status: AC | PRN
  Administered 2022-01-29: 50 mL via INTRA_ARTERIAL

## 2022-01-29 MED ORDER — CEFAZOLIN SODIUM-DEXTROSE 2-4 GM/100ML-% IV SOLN: 2.0000 g | INTRAVENOUS | Status: AC

## 2022-01-29 MED ORDER — HYDROMORPHONE 1 MG/ML IV SOLN
INTRAVENOUS | Status: DC
  Administered 2022-01-29: 1.7 mg via INTRAVENOUS
  Administered 2022-01-30: 0.6 mg via INTRAVENOUS
  Administered 2022-01-30: 1.25 mg via INTRAVENOUS
  Filled 2022-01-29: qty 30

## 2022-01-29 MED ORDER — MIDAZOLAM HCL 2 MG/2ML IJ SOLN
INTRAMUSCULAR | Status: AC | PRN
  Administered 2022-01-29: 1 mg via INTRAVENOUS

## 2022-01-29 MED ORDER — DIPHENHYDRAMINE HCL 50 MG/ML IJ SOLN: 12.5000 mg | Freq: Four times a day (QID) | INTRAMUSCULAR | Status: DC | PRN

## 2022-01-29 MED ORDER — IOHEXOL 300 MG/ML  SOLN: 100.0000 mL | Freq: Once | INTRAMUSCULAR | Status: DC | PRN

## 2022-01-29 MED ORDER — KETOROLAC TROMETHAMINE 30 MG/ML IJ SOLN
INTRAMUSCULAR | Status: AC | PRN
  Administered 2022-01-29: 30 mg via INTRAVENOUS

## 2022-01-29 MED ORDER — PROMETHAZINE HCL 25 MG PO TABS
25.0000 mg | ORAL_TABLET | Freq: Three times a day (TID) | ORAL | Status: DC | PRN
  Administered 2022-01-29: 25 mg via ORAL
  Filled 2022-01-29: qty 1

## 2022-01-29 MED ORDER — SODIUM CHLORIDE 0.9 % IV SOLN: 250.0000 mL | INTRAVENOUS | Status: DC | PRN

## 2022-01-29 MED ORDER — KETOROLAC TROMETHAMINE 30 MG/ML IJ SOLN: 30.0000 mg | INTRAMUSCULAR | Status: DC

## 2022-01-29 MED ORDER — NITROGLYCERIN 1 MG/10 ML FOR IR/CATH LAB
INTRA_ARTERIAL | Status: AC | PRN
  Administered 2022-01-29: 100 ug via INTRA_ARTERIAL

## 2022-01-29 MED ORDER — LIDOCAINE HCL 1 % IJ SOLN
INTRAMUSCULAR | Status: AC
  Filled 2022-01-29: qty 20

## 2022-01-29 MED ORDER — SODIUM CHLORIDE 0.9% FLUSH: 9.0000 mL | INTRAVENOUS | Status: DC | PRN

## 2022-01-29 MED ORDER — CEFAZOLIN SODIUM-DEXTROSE 2-4 GM/100ML-% IV SOLN
INTRAVENOUS | Status: AC
  Administered 2022-01-29: 2 g via INTRAVENOUS
  Filled 2022-01-29: qty 100

## 2022-01-29 MED ORDER — MIDAZOLAM HCL 2 MG/2ML IJ SOLN
INTRAMUSCULAR | Status: AC
  Filled 2022-01-29: qty 4

## 2022-01-29 MED ORDER — DOCUSATE SODIUM 100 MG PO CAPS
100.0000 mg | ORAL_CAPSULE | Freq: Two times a day (BID) | ORAL | Status: DC
  Administered 2022-01-29 – 2022-01-30 (×2): 100 mg via ORAL
  Filled 2022-01-29 (×2): qty 1

## 2022-01-29 MED ORDER — SODIUM CHLORIDE 0.9% FLUSH
3.0000 mL | Freq: Two times a day (BID) | INTRAVENOUS | Status: DC
  Administered 2022-01-30: 3 mL via INTRAVENOUS

## 2022-01-29 MED ORDER — LIDOCAINE HCL 1 % IJ SOLN
INTRAMUSCULAR | Status: AC | PRN
  Administered 2022-01-29: 10 mL via INTRADERMAL

## 2022-01-29 MED ORDER — FENTANYL CITRATE (PF) 100 MCG/2ML IJ SOLN
INTRAMUSCULAR | Status: AC
  Filled 2022-01-29: qty 6

## 2022-01-29 MED ORDER — PROMETHAZINE HCL 25 MG RE SUPP: 25.0000 mg | Freq: Three times a day (TID) | RECTAL | Status: DC | PRN

## 2022-01-29 MED ORDER — ENOXAPARIN SODIUM 40 MG/0.4ML IJ SOSY
40.0000 mg | PREFILLED_SYRINGE | INTRAMUSCULAR | Status: DC
  Administered 2022-01-30: 40 mg via SUBCUTANEOUS
  Filled 2022-01-29: qty 0.4

## 2022-01-29 MED ORDER — ONDANSETRON HCL 4 MG/2ML IJ SOLN
INTRAMUSCULAR | Status: AC
  Filled 2022-01-29: qty 2

## 2022-01-29 MED ORDER — NALOXONE HCL 0.4 MG/ML IJ SOLN: 0.4000 mg | INTRAMUSCULAR | Status: DC | PRN

## 2022-01-29 MED ORDER — SODIUM CHLORIDE 0.9 % IV SOLN: INTRAVENOUS | Status: DC

## 2022-01-29 MED ORDER — SODIUM CHLORIDE 0.9% FLUSH: 3.0000 mL | INTRAVENOUS | Status: DC | PRN

## 2022-01-29 MED ORDER — NITROGLYCERIN IN D5W 100-5 MCG/ML-% IV SOLN
INTRAVENOUS | Status: AC
  Filled 2022-01-29: qty 250

## 2022-01-29 MED ORDER — ONDANSETRON HCL 4 MG/2ML IJ SOLN
INTRAMUSCULAR | Status: AC | PRN
  Administered 2022-01-29: 4 mg via INTRAVENOUS

## 2022-01-29 MED ORDER — FLUCONAZOLE 150 MG PO TABS: 150.0000 mg | ORAL_TABLET | ORAL | 0 refills | Status: DC

## 2022-01-29 MED ORDER — ONDANSETRON HCL 4 MG/2ML IJ SOLN
4.0000 mg | Freq: Four times a day (QID) | INTRAMUSCULAR | Status: DC | PRN
  Administered 2022-01-29 (×2): 4 mg via INTRAVENOUS
  Filled 2022-01-29 (×2): qty 2

## 2022-01-29 MED ORDER — DIPHENHYDRAMINE HCL 12.5 MG/5ML PO ELIX: 12.5000 mg | ORAL_SOLUTION | Freq: Four times a day (QID) | ORAL | Status: DC | PRN

## 2022-01-29 MED ORDER — FLUCONAZOLE IN SODIUM CHLORIDE 400-0.9 MG/200ML-% IV SOLN
400.0000 mg | Freq: Once | INTRAVENOUS | Status: AC
  Administered 2022-01-29: 400 mg via INTRAVENOUS
  Filled 2022-01-29: qty 200

## 2022-01-29 MED ORDER — KETOROLAC TROMETHAMINE 30 MG/ML IJ SOLN
INTRAMUSCULAR | Status: AC
  Filled 2022-01-29: qty 1

## 2022-01-29 NOTE — H&P (Signed)
Chief Complaint: Patient was seen in consultation today for uterine fibroid embolization   Referring Physician(s): Mir,Farhaan R  Supervising Physician: Mir, Sharen Heck  Patient Status: [facility] - Out-pt  History of Present Illness: Jessica Dudley is a 46 y.o. female with a medical history significant for pulmonary emboli, iron deficiency anemia, PCOS and symptomatic uterine fibroids. She was referred to Interventional Radiology to discuss possible treatment and management options for menorrhagia related to fibroids. She has had years of heavy bleeding with subsequent anemia. She experiences moderate to severe back pain at the start of her cycles. She met with Dr. Dwaine Gale 11/22/21 and the risks, benefits and alternatives of uterine artery embolization were discussed. The patient was in agreement to proceed. She presents today to the [facility] IR department for an image-guided  uterine artery embolization. The procedure will be done under moderate sedation with one night of observation.   Past Medical History:  Diagnosis Date   Hx of pulmonary embolus 07/23/2017   In vitro fertilization 07/02/2016   Hematology supervision in view of previous PE 12/11/15   Iron deficiency anemia    PCOS (polycystic ovarian syndrome)    Pregnancy, high-risk 07/23/2017   Uterine fibroid    Wears glasses     Past Surgical History:  Procedure Laterality Date   CESAREAN SECTION WITH BILATERAL TUBAL LIGATION N/A 08/22/2017   Procedure: PRIMARY CESAREAN SECTION WITH BILATERAL TUBAL LIGATION;  Surgeon: Servando Salina, MD;  Location: [place];  Service: Obstetrics;  Laterality: N/A;  Classical incision EDD: 09/12/17 Allergy: Vicodin   CHROMOPERTUBATION N/A 11/29/2015   Procedure: ABDOMINAL MYOMECTOMY;  Surgeon: Governor Specking, MD;  Location: [place];  Service: Gynecology;  Laterality: N/A;   IR RADIOLOGIST EVAL & MGMT  11/22/2021   MYOMECTOMY     MYOMECTOMY N/A 08/22/2017   Procedure:  MYOMECTOMY;  Surgeon: Servando Salina, MD;  Location: [place];  Service: Obstetrics;  Laterality: N/A;   NO PAST SURGERIES      Allergies: Norco [hydrocodone-acetaminophen] and Oxycodone-acetaminophen  Medications: Prior to Admission medications   Medication Sig Start Date End Date Taking? Authorizing Provider  Cholecalciferol (VITAMIN D3) 10 MCG (400 UNIT) tablet Take by mouth.    [provider]  ferrous sulfate 325 (65 FE) MG EC tablet Take 1 tablet (325 mg total) by mouth 2 (two) times daily with a meal. 11/29/21   Ladell Pier, MD  Magnesium Oxide (MAG-OX 400 PO) Take 400 mg by mouth daily.    [provider]  valACYclovir (VALTREX) 500 MG tablet Take 1 tablet (500 mg total) by mouth 2 (two) times daily. For 3-5 days for outbreak Patient not taking: Reported on 01/10/2022 02/22/16   Panosh, Standley Brooking, MD  vitamin C (ASCORBIC ACID) 500 MG tablet Take 500 mg by mouth daily.    [provider]  VITAMIN E COMPLEX PO Vitamin E    [provider]     Family History  Problem Relation Age of Onset   Hypertension Mother    Prostate cancer Paternal Uncle    Diabetes Maternal Grandmother    Leukemia Cousin    Brain cancer Cousin    Deep vein thrombosis Father    Varicose Veins Father     Social History   Socioeconomic History   Marital status: Divorced    Spouse name: Not on file   Number of children: Not on file   Years of education: Not on file   Highest education level: Not on file  Occupational History   Not on file  Tobacco Use   Smoking status: Never   Smokeless tobacco: Never  Substance and Sexual Activity   Alcohol use: No   Drug use: No   Sexual activity: Yes    Birth control/protection: None  Other Topics Concern   Not on file  Social History Narrative   Divorced  Remarried   In vitro to deliver in April   On lovenox      Regular exercise- no   HH of 3 with daughter no pets       daughter    Works [employer]   40  hours    MOM ruby [place]   Had 59 yo at home goes to day care 2022   Social Determinants of Health   Financial Resource Strain: Not on file  Food Insecurity: Not on file  Transportation Needs: Not on file  Physical Activity: Not on file  Stress: Not on file  Social Connections: Not on file    Review of Systems: A 12 point ROS discussed and pertinent positives are indicated in the HPI above.  All other systems are negative.  Review of Systems  All other systems reviewed and are negative.   Vital Signs: BP 125/71   Pulse 76   Temp 98.6 F (37 C) (Oral)   Resp 16   Ht 5' 1.5" (1.562 m)   Wt 136 lb 11 oz (62 kg)   SpO2 100%   BMI 25.41 kg/m   Physical Exam Vitals reviewed.  Constitutional:      General: She is not in acute distress.    Appearance: Normal appearance. She is not ill-appearing.  HENT:     Head: Normocephalic and atraumatic.     Mouth/Throat:     Mouth: Mucous membranes are dry.  Eyes:     Extraocular Movements: Extraocular movements intact.     Pupils: Pupils are equal, round, and reactive to light.  Cardiovascular:     Rate and Rhythm: Normal rate and regular rhythm.     Pulses: Normal pulses.     Heart sounds: Normal heart sounds. No murmur heard. Pulmonary:     Effort: Pulmonary effort is normal. No respiratory distress.     Breath sounds: Normal breath sounds.  Abdominal:     General: Bowel sounds are normal. There is no distension.     Palpations: Abdomen is soft.     Tenderness: There is no abdominal tenderness. There is no guarding.  Musculoskeletal:     Right lower leg: No edema.     Left lower leg: No edema.  Skin:    General: Skin is warm and dry.  Neurological:     Mental Status: She is alert and oriented to person, place, and time.  Psychiatric:        Mood and Affect: Mood normal.        Behavior: Behavior normal.        Thought Content: Thought content normal.        Judgment: Judgment normal.     Imaging: No results  found.  Labs:  CBC: Recent Labs    06/12/21 1151 11/06/21 0945 11/29/21 1105 01/10/22 1031  WBC 3.8* 3.2* 3.9* 5.1  HGB 10.6* 8.5* 9.5* 9.2*  HCT 34.9* 27.5* 32.7* 31.4*  PLT 237.0 209.0 249 227    COAGS: No results for input(s): "INR", "APTT" in the last 8760 hours.  BMP: Recent Labs    06/12/21 1151  NA 137  K  3.6  CL 105  CO2 26  GLUCOSE 91  BUN 13  CALCIUM 9.5  CREATININE 0.89    LIVER FUNCTION TESTS: Recent Labs    06/12/21 1151  BILITOT 0.7  AST 14  ALT 13  ALKPHOS 39  PROT 7.4  ALBUMIN 4.6    TUMOR MARKERS: No results for input(s): "AFPTM", "CEA", "CA199", "CHROMGRNA" in the last 8760 hours.  Assessment and Plan:  Symptomatic uterine fibroids: Jessica Dudley, 46 year old female, presents today the [facility] department for an image-guided uterine artery embolization.   Pt resting on stretcher. She is A&O, calm and pleasant.  She is in no distress.  Pt is NPO per order.   The Risks and benefits of embolization were discussed with the patient including, but not limited to bleeding, infection, vascular injury, post operative pain, or contrast induced renal failure. The patient is also aware that this procedure will likely render her infertile. The patient has two children and states she has no intention of further pregnancies.   This procedure involves the use of X-rays and because of the nature of the planned procedure, it is possible that we will have prolonged use of X-ray fluoroscopy.  Potential radiation risks to you include (but are not limited to) the following: - A slightly elevated risk for cancer several years later in life. This risk is typically less than 0.5% percent. This risk is low in comparison to the normal incidence of human cancer, which is 33% for women and 50% for men according to the [place]. - Radiation induced injury can include skin redness, resembling a rash, tissue breakdown /  ulcers and hair loss (which can be temporary or permanent).   The likelihood of either of these occurring depends on the difficulty of the procedure and whether you are sensitive to radiation due to previous procedures, disease, or genetic conditions.   IF your procedure requires a prolonged use of radiation, you will be notified and given written instructions for further action.  It is your responsibility to monitor the irradiated area for the 2 weeks following the procedure and to notify your physician if you are concerned that you have suffered a radiation induced injury.    All of the patient's questions were answered, patient is agreeable to proceed. She has been NPO.  Consent signed and in chart.  Thank you for this interesting consult.  I greatly enjoyed meeting Jessica Dudley and look forward to participating in their care.  A copy of this report was sent to the requesting provider on this date.  Electronically Signed: Soyla Dryer, AGACNP-BC [phone] 01/29/2022, 12:58 PM   I spent a total of  30 Minutes   in face to face in clinical consultation, greater than 50% of which was counseling/coordinating care for uterine fibroid embolization.

## 2022-01-29 NOTE — Procedures (Signed)
Interventional Radiology Procedure Note  Procedure: Uterine Fibroid Embolization  Indication: Menorrhagia   Findings: Please refer to procedural dictation for full description.  Complications: None  EBL: < 10 mL  Miachel Roux, MD [phone]

## 2022-01-30 DIAGNOSIS — D259 Leiomyoma of uterus, unspecified: Secondary | ICD-10-CM | POA: Diagnosis not present

## 2022-01-30 MED ORDER — ONDANSETRON HCL 4 MG PO TABS
4.0000 mg | ORAL_TABLET | Freq: Once | ORAL | Status: AC
  Administered 2022-01-30: 4 mg via ORAL
  Filled 2022-01-30: qty 1

## 2022-01-30 MED ORDER — ONDANSETRON HCL 4 MG PO TABS: 4.0000 mg | ORAL_TABLET | Freq: Three times a day (TID) | ORAL | 0 refills | Status: AC | PRN

## 2022-01-30 MED ORDER — TRAMADOL HCL 50 MG PO TABS
50.0000 mg | ORAL_TABLET | Freq: Once | ORAL | Status: AC
  Administered 2022-01-30: 50 mg via ORAL
  Filled 2022-01-30: qty 1

## 2022-01-30 MED ORDER — TRAMADOL HCL 50 MG PO TABS: 50.0000 mg | ORAL_TABLET | Freq: Four times a day (QID) | ORAL | 0 refills | Status: DC | PRN

## 2022-01-30 MED ORDER — FLUCONAZOLE 150 MG PO TABS: 150.0000 mg | ORAL_TABLET | ORAL | 0 refills | Status: AC

## 2022-01-30 NOTE — TOC CM/SW Note (Signed)
  Transition of Care [facility]) Screening Note   Patient Details  Name: IVONNE FREEBURG Date of Birth: 16-Sep-1975   Transition of Care [facility]) CM/SW Contact:    Ross Ludwig, LCSW Phone Number: 01/30/2022, 10:47 AM    Transition of Care Department [facility]) has reviewed patient and no TOC needs have been identified at this time. We will continue to monitor patient advancement through interdisciplinary progression rounds. If new patient transition needs arise, please place a TOC consult.

## 2022-01-30 NOTE — Plan of Care (Signed)
Patient is stable for discharge. Discharge instructions give, instructions understood. All questions answered. Patient is discharged home with visitor.

## 2022-01-30 NOTE — Discharge Summary (Signed)
Patient ID: Jessica Dudley MRN: [id number] DOB/AGE: [birth year] 46 y.o.  Admit date: 01/29/2022 Discharge date: 01/30/2022  Supervising Physician: Ruthann Cancer  Patient Status: [facility] - In-pt  Admission Diagnoses: Uterine Fibroids  Discharge Diagnoses:  Principal Problem:   Fibroids   Discharged Condition: good  Hospital Course: Patient presented 9/25 and underwent planned uterine artery embolization.  The left uterine artery was accessed and embolized without incident.  The right uterine artery was not able to be embolized as it could not be sufficiently accessed due to apparent spasm.  Jessica Dudley was admitted for overnight observation and pain was well controlled with PCA.  She successfully voided after bedrest.  Though she experienced some nausea yesterday evening, this has now resolved and she tolerated breakfast this morning.  Her pain is controlled.  Access site is without complication and she is stable for discharge.    Consults: None  Significant Diagnostic Studies: angiography: intra-procedural imaging  Treatments: left uterine artery embolization  Discharge Exam: Blood pressure 131/79, pulse 76, temperature 98.6 F (37 C), temperature source Oral, resp. rate 17, height 5' 1.5" (1.562 m), weight 136 lb 11 oz (62 kg), SpO2 100 %. General appearance: alert, cooperative, and no distress Head: Normocephalic, without obvious abnormality, atraumatic Eyes: EOM intact, conjunctivae normal Resp: non-labored breathing GI: normal findings: soft, non-tender Extremities: extremities normal, atraumatic, no cyanosis or edema Pulses: 2+ and symmetric Skin: Skin color, texture, turgor normal. No rashes or lesions or Right CFA site soft, without bleeding, hematoma, or evidence for pseudoaneurysm Neurologic: Grossly normal  Disposition: Discharge disposition: 01-Home or Self Care      Discharge Instructions     Call MD for:  difficulty breathing, headache or visual disturbances    Complete by: As directed    Call MD for:  persistant dizziness or light-headedness   Complete by: As directed    Call MD for:  persistant nausea and vomiting   Complete by: As directed    Call MD for:  redness, tenderness, or signs of infection (pain, swelling, redness, odor or green/yellow discharge around incision site)   Complete by: As directed    Call MD for:  severe uncontrolled pain   Complete by: As directed    Call MD for:  temperature >100.4   Complete by: As directed    Discharge instructions   Complete by: As directed    Take Tramadol '50mg'$  every 6 hours for pain.  Recommend taking Ibuprofen 600-'800mg'$  every 8 hours for first 2-3 days to stay ahead of the pain.  These should both be taken with food on the stomach. You will receive phone call regarding scheduling your post-procedural follow up appointment, to occur in 2-3 weeks.  You may call [phone] or [phone] with questions or concerns   Driving Restrictions   Complete by: As directed    No driving for 24 hours after taking Tramadol.   Increase activity slowly   Complete by: As directed    Lifting restrictions   Complete by: As directed    No more than 10lbs for 3 days.  May increase gradually as tolerated, thereafter   May shower / Bathe   Complete by: As directed    May shower tomorrow 9/27.  Avoid soaking in tub until incision site is healed.   Remove dressing in 24 hours   Complete by: As directed       Allergies as of 01/30/2022       Reactions   Norco [hydrocodone-acetaminophen] Nausea And Vomiting  Oxycodone-acetaminophen Nausea And Vomiting        Medication List     TAKE these medications    ascorbic acid 500 MG tablet Commonly known as: VITAMIN C Take 500 mg by mouth daily.   ferrous sulfate 325 (65 FE) MG EC tablet Take 1 tablet (325 mg total) by mouth 2 (two) times daily with a meal.   fluconazole 150 MG tablet Commonly known as: Diflucan Take 1 tablet (150 mg total) by mouth  every 3 (three) days.   fluconazole 150 MG tablet Commonly known as: DIFLUCAN Take 1 tablet (150 mg total) by mouth every 3 (three) days for 2 doses.   MAG-OX 400 PO Take 400 mg by mouth daily.   ondansetron 4 MG tablet Commonly known as: Zofran Take 1 tablet (4 mg total) by mouth every 8 (eight) hours as needed for up to 5 days for nausea or vomiting.   traMADol 50 MG tablet Commonly known as: Ultram Take 1 tablet (50 mg total) by mouth every 6 (six) hours as needed for severe pain.   valACYclovir 500 MG tablet Commonly known as: VALTREX Take 1 tablet (500 mg total) by mouth 2 (two) times daily. For 3-5 days for outbreak What changed: additional instructions   Vitamin D3 10 MCG (400 UNIT) tablet Take by mouth.   VITAMIN E COMPLEX PO Take 1 tablet by mouth daily.          Electronically Signed: Pasty Spillers, PA 01/30/2022, 10:45 AM   I have spent Greater Than 30 Minutes discharging Jessica Mt.

## 2022-02-05 ENCOUNTER — Inpatient Hospital Stay (HOSPITAL_BASED_OUTPATIENT_CLINIC_OR_DEPARTMENT_OTHER): Payer: BC Managed Care – PPO | Admitting: Nurse Practitioner

## 2022-02-05 ENCOUNTER — Encounter: Payer: Self-pay | Admitting: Nurse Practitioner

## 2022-02-05 ENCOUNTER — Inpatient Hospital Stay: Payer: BC Managed Care – PPO | Attending: Oncology

## 2022-02-05 VITALS — BP 127/87 | HR 84 | Temp 98.2°F | Resp 18 | Ht 61.5 in | Wt 132.8 lb

## 2022-02-05 DIAGNOSIS — D500 Iron deficiency anemia secondary to blood loss (chronic): Secondary | ICD-10-CM | POA: Insufficient documentation

## 2022-02-05 DIAGNOSIS — D259 Leiomyoma of uterus, unspecified: Secondary | ICD-10-CM | POA: Diagnosis not present

## 2022-02-05 DIAGNOSIS — D509 Iron deficiency anemia, unspecified: Secondary | ICD-10-CM | POA: Diagnosis not present

## 2022-02-05 DIAGNOSIS — N920 Excessive and frequent menstruation with regular cycle: Secondary | ICD-10-CM | POA: Insufficient documentation

## 2022-02-05 LAB — CBC WITH DIFFERENTIAL (CANCER CENTER ONLY)
Abs Immature Granulocytes: 0.01 10*3/uL (ref 0.00–0.07)
Basophils Absolute: 0 10*3/uL (ref 0.0–0.1)
Basophils Relative: 1 %
Eosinophils Absolute: 0.1 10*3/uL (ref 0.0–0.5)
Eosinophils Relative: 2 %
HCT: 28.8 % — ABNORMAL LOW (ref 36.0–46.0)
Hemoglobin: 8.4 g/dL — ABNORMAL LOW (ref 12.0–15.0)
Immature Granulocytes: 0 %
Lymphocytes Relative: 34 %
Lymphs Abs: 1.1 10*3/uL (ref 0.7–4.0)
MCH: 17.9 pg — ABNORMAL LOW (ref 26.0–34.0)
MCHC: 29.2 g/dL — ABNORMAL LOW (ref 30.0–36.0)
MCV: 61.4 fL — ABNORMAL LOW (ref 80.0–100.0)
Monocytes Absolute: 0.3 10*3/uL (ref 0.1–1.0)
Monocytes Relative: 10 %
Neutro Abs: 1.8 10*3/uL (ref 1.7–7.7)
Neutrophils Relative %: 53 %
Platelet Count: 263 10*3/uL (ref 150–400)
RBC: 4.69 MIL/uL (ref 3.87–5.11)
RDW: 19 % — ABNORMAL HIGH (ref 11.5–15.5)
WBC Count: 3.4 10*3/uL — ABNORMAL LOW (ref 4.0–10.5)
nRBC: 0 % (ref 0.0–0.2)

## 2022-02-05 LAB — FERRITIN: Ferritin: 3 ng/mL — ABNORMAL LOW (ref 11–307)

## 2022-02-05 NOTE — Progress Notes (Signed)
  [place] OFFICE PROGRESS NOTE   Diagnosis: Iron deficiency anemia  INTERVAL HISTORY:   Ms. Theissen returns as scheduled.  She underwent left uterine artery embolization 01/29/2022.  She reports a fairly heavy menstrual cycle within a day or 2 of the procedure.  She continues to have spotting.  She has not been taking oral iron consistently.    Objective:  Vital signs in last 24 hours:  Blood pressure 127/87, pulse 84, temperature 98.2 F (36.8 C), temperature source Oral, resp. rate 18, height 5' 1.5" (1.562 m), weight 132 lb 12.8 oz (60.2 kg), SpO2 100 %.    Resp: Lungs clear bilaterally. Cardio: Regular rate and rhythm. GI: Abdomen soft and nontender.  No hepatosplenomegaly. Vascular: No leg edema.   Lab Results:  Lab Results  Component Value Date   WBC 7.8 01/29/2022   HGB 8.0 (L) 01/29/2022   HCT 28.2 (L) 01/29/2022   MCV 63.2 (L) 01/29/2022   PLT 223 01/29/2022   NEUTROABS 2.3 01/29/2022    Imaging:  No results found.  Medications: I have reviewed the patient's current medications.  Assessment/Plan: Iron deficiency anemia Menorrhagia secondary to uterine fibroids History of pulmonary embolus 12/11/2015, occurred after an abdominal myomectomy to resect uterine fibroids on 11/29/2015, completed Xarelto x3 months PCOS History of mild leukopenia, likely benign normal variant Status post left uterine artery embolization 01/29/2022  Disposition: Ms. Duerr appears stable.  We reviewed the CBC and ferritin from today.  She has persistent iron deficiency anemia.  She has not been taking oral iron consistently.  We discussed IV iron including the potential for an allergic reaction.  She would like to try oral iron consistently for the next 4 weeks.  If there is no improvement at that time she will consider IV iron.  She will return for lab and follow-up in 4 weeks.    Ned Card ANP/GNP-BC   02/05/2022  11:48 AM

## 2022-03-01 ENCOUNTER — Telehealth: Payer: BC Managed Care – PPO

## 2022-03-05 ENCOUNTER — Inpatient Hospital Stay (HOSPITAL_BASED_OUTPATIENT_CLINIC_OR_DEPARTMENT_OTHER): Payer: BC Managed Care – PPO | Admitting: Oncology

## 2022-03-05 ENCOUNTER — Inpatient Hospital Stay: Payer: BC Managed Care – PPO

## 2022-03-05 VITALS — BP 138/87 | HR 79 | Temp 98.2°F | Resp 18 | Ht 61.0 in | Wt 137.2 lb

## 2022-03-05 DIAGNOSIS — D509 Iron deficiency anemia, unspecified: Secondary | ICD-10-CM | POA: Diagnosis not present

## 2022-03-05 DIAGNOSIS — N920 Excessive and frequent menstruation with regular cycle: Secondary | ICD-10-CM | POA: Diagnosis not present

## 2022-03-05 DIAGNOSIS — D500 Iron deficiency anemia secondary to blood loss (chronic): Secondary | ICD-10-CM | POA: Diagnosis not present

## 2022-03-05 DIAGNOSIS — D259 Leiomyoma of uterus, unspecified: Secondary | ICD-10-CM | POA: Diagnosis not present

## 2022-03-05 LAB — CBC WITH DIFFERENTIAL (CANCER CENTER ONLY)
Abs Immature Granulocytes: 0.02 10*3/uL (ref 0.00–0.07)
Basophils Absolute: 0 10*3/uL (ref 0.0–0.1)
Basophils Relative: 1 %
Eosinophils Absolute: 0 10*3/uL (ref 0.0–0.5)
Eosinophils Relative: 1 %
HCT: 29.8 % — ABNORMAL LOW (ref 36.0–46.0)
Hemoglobin: 8.6 g/dL — ABNORMAL LOW (ref 12.0–15.0)
Immature Granulocytes: 0 %
Lymphocytes Relative: 24 %
Lymphs Abs: 1.3 10*3/uL (ref 0.7–4.0)
MCH: 17.4 pg — ABNORMAL LOW (ref 26.0–34.0)
MCHC: 28.9 g/dL — ABNORMAL LOW (ref 30.0–36.0)
MCV: 60.3 fL — ABNORMAL LOW (ref 80.0–100.0)
Monocytes Absolute: 0.5 10*3/uL (ref 0.1–1.0)
Monocytes Relative: 9 %
Neutro Abs: 3.6 10*3/uL (ref 1.7–7.7)
Neutrophils Relative %: 65 %
Platelet Count: 245 10*3/uL (ref 150–400)
RBC: 4.94 MIL/uL (ref 3.87–5.11)
RDW: 18.6 % — ABNORMAL HIGH (ref 11.5–15.5)
WBC Count: 5.4 10*3/uL (ref 4.0–10.5)
nRBC: 0 % (ref 0.0–0.2)

## 2022-03-05 LAB — FERRITIN: Ferritin: 4 ng/mL — ABNORMAL LOW (ref 11–307)

## 2022-03-05 NOTE — Progress Notes (Signed)
  [place] OFFICE PROGRESS NOTE   Diagnosis: Iron deficiency anemia  INTERVAL HISTORY:    Ms. Molchan returns as scheduled.  She reports having 2 menstrual cycles since undergoing the uterine embolization procedure.  The most recent menstrual cycle lasted 4 days.  She is taking iron once every other day.  The iron does not cause constipation which she is taking magnesium.  She has fatigue.  No other complaint.  Objective:  Vital signs in last 24 hours:  Blood pressure 138/87, pulse 79, temperature 98.2 F (36.8 C), temperature source Oral, resp. rate 18, height '5\' 1"'$  (1.549 m), weight 137 lb 3.2 oz (62.2 kg), SpO2 100 %.    Resp: Lungs clear bilaterally Cardio: Regular rate and rhythm GI: No hepatosplenomegaly, the uterine fibroids are palpable in the lower mid abdomen and to the right of midline   Lab Results:  Lab Results  Component Value Date   WBC 5.4 03/05/2022   HGB 8.6 (L) 03/05/2022   HCT 29.8 (L) 03/05/2022   MCV 60.3 (L) 03/05/2022   PLT 245 03/05/2022   NEUTROABS 3.6 03/05/2022    CMP  Lab Results  Component Value Date   NA 140 01/29/2022   K 3.7 01/29/2022   CL 110 01/29/2022   CO2 23 01/29/2022   GLUCOSE 83 01/29/2022   BUN 15 01/29/2022   CREATININE 0.79 01/29/2022   CALCIUM 9.4 01/29/2022   PROT 7.4 06/12/2021   ALBUMIN 4.6 06/12/2021   AST 14 06/12/2021   ALT 13 06/12/2021   ALKPHOS 39 06/12/2021   BILITOT 0.7 06/12/2021   GFRNONAA >60 01/29/2022   GFRAA >60 07/16/2017     Medications: I have reviewed the patient's current medications.   Assessment/Plan:  Iron deficiency anemia Menorrhagia secondary to uterine fibroids History of pulmonary embolus 12/11/2015, occurred after an abdominal myomectomy to resect uterine fibroids on 11/29/2015, completed Xarelto x3 months PCOS History of mild leukopenia, likely benign normal variant Status post left uterine artery embolization 01/29/2022   Disposition: Ms. Hitz has  persistent iron deficiency anemia.  The most recent menstrual cycle lasted for fewer days.  Hopefully the anemia will improve over the next few months.  I encouraged her to increase the ferrous sulfate to twice daily. She does not wish to receive IV iron at present.  We reviewed potential toxicities associated with IV iron including the chance of an allergic reaction.  She will return for an office visit and CBC in 3 weeks.  Betsy Coder, MD  03/05/2022  12:29 PM

## 2022-03-23 DIAGNOSIS — F4310 Post-traumatic stress disorder, unspecified: Secondary | ICD-10-CM | POA: Diagnosis not present

## 2022-03-31 DIAGNOSIS — F4323 Adjustment disorder with mixed anxiety and depressed mood: Secondary | ICD-10-CM | POA: Diagnosis not present

## 2022-04-03 ENCOUNTER — Inpatient Hospital Stay (HOSPITAL_BASED_OUTPATIENT_CLINIC_OR_DEPARTMENT_OTHER): Payer: BC Managed Care – PPO | Admitting: Nurse Practitioner

## 2022-04-03 ENCOUNTER — Inpatient Hospital Stay: Payer: BC Managed Care – PPO | Attending: Oncology

## 2022-04-03 ENCOUNTER — Encounter: Payer: Self-pay | Admitting: Nurse Practitioner

## 2022-04-03 VITALS — BP 121/68 | HR 97 | Temp 98.1°F | Resp 18 | Ht 61.0 in | Wt 137.8 lb

## 2022-04-03 DIAGNOSIS — D500 Iron deficiency anemia secondary to blood loss (chronic): Secondary | ICD-10-CM | POA: Diagnosis not present

## 2022-04-03 DIAGNOSIS — D509 Iron deficiency anemia, unspecified: Secondary | ICD-10-CM

## 2022-04-03 DIAGNOSIS — E282 Polycystic ovarian syndrome: Secondary | ICD-10-CM | POA: Insufficient documentation

## 2022-04-03 DIAGNOSIS — N920 Excessive and frequent menstruation with regular cycle: Secondary | ICD-10-CM | POA: Insufficient documentation

## 2022-04-03 DIAGNOSIS — D259 Leiomyoma of uterus, unspecified: Secondary | ICD-10-CM | POA: Diagnosis not present

## 2022-04-03 DIAGNOSIS — Z86711 Personal history of pulmonary embolism: Secondary | ICD-10-CM | POA: Diagnosis not present

## 2022-04-03 LAB — CBC WITH DIFFERENTIAL (CANCER CENTER ONLY)
Abs Immature Granulocytes: 0.01 10*3/uL (ref 0.00–0.07)
Basophils Absolute: 0 10*3/uL (ref 0.0–0.1)
Basophils Relative: 1 %
Eosinophils Absolute: 0 10*3/uL (ref 0.0–0.5)
Eosinophils Relative: 1 %
HCT: 31.2 % — ABNORMAL LOW (ref 36.0–46.0)
Hemoglobin: 9 g/dL — ABNORMAL LOW (ref 12.0–15.0)
Immature Granulocytes: 0 %
Lymphocytes Relative: 38 %
Lymphs Abs: 1.5 10*3/uL (ref 0.7–4.0)
MCH: 17.2 pg — ABNORMAL LOW (ref 26.0–34.0)
MCHC: 28.8 g/dL — ABNORMAL LOW (ref 30.0–36.0)
MCV: 59.8 fL — ABNORMAL LOW (ref 80.0–100.0)
Monocytes Absolute: 0.3 10*3/uL (ref 0.1–1.0)
Monocytes Relative: 8 %
Neutro Abs: 2.1 10*3/uL (ref 1.7–7.7)
Neutrophils Relative %: 52 %
Platelet Count: 265 10*3/uL (ref 150–400)
RBC: 5.22 MIL/uL — ABNORMAL HIGH (ref 3.87–5.11)
RDW: 18.4 % — ABNORMAL HIGH (ref 11.5–15.5)
Smear Review: ADEQUATE
WBC Count: 4 10*3/uL (ref 4.0–10.5)
nRBC: 0 % (ref 0.0–0.2)

## 2022-04-03 LAB — FERRITIN: Ferritin: 3 ng/mL — ABNORMAL LOW (ref 11–307)

## 2022-04-03 NOTE — Progress Notes (Signed)
  [place] OFFICE PROGRESS NOTE   Diagnosis: Iron deficiency anemia  INTERVAL HISTORY:   Ms. Junod returns as scheduled.  She had a menstrual cycle since her last visit.  Overall lighter but she still had significant bleeding for at least 2 days.  She did not see any blood clots.  She is not taking oral iron consistently.  She feels less fatigued.  Objective:  Vital signs in last 24 hours:  Blood pressure 121/68, pulse 97, temperature 98.1 F (36.7 C), temperature source Oral, resp. rate 18, height '5\' 1"'$  (1.549 m), weight 137 lb 12.8 oz (62.5 kg), SpO2 100 %.    HEENT: No thrush or ulcers. Resp: Lungs clear bilaterally. Cardio: Regular rate and rhythm. GI: Abdomen soft and nontender.  No hepatosplenomegaly. Vascular: No leg edema.   Lab Results:  Lab Results  Component Value Date   WBC 4.0 04/03/2022   HGB 9.0 (L) 04/03/2022   HCT 31.2 (L) 04/03/2022   MCV 59.8 (L) 04/03/2022   PLT 265 04/03/2022   NEUTROABS 2.1 04/03/2022    Imaging:  No results found.  Medications: I have reviewed the patient's current medications.  Assessment/Plan: Iron deficiency anemia Menorrhagia secondary to uterine fibroids History of pulmonary embolus 12/11/2015, occurred after an abdominal myomectomy to resect uterine fibroids on 11/29/2015, completed Xarelto x3 months PCOS History of mild leukopenia, likely benign normal variant Status post left uterine artery embolization 01/29/2022  Disposition: Ms. Mounsey has persistent iron deficiency anemia.  Menstrual cycle overall is lighter but she continues to note significant bleeding.  She was unable to increase ferrous sulfate to twice daily.  She is interested in IV iron.  We reviewed potential toxicities including the chance of a severe allergic reaction.  She agrees to proceed.  Plan for Feraheme weekly x 2 beginning sometime over the next few weeks.  She will return for lab and follow-up in approximately 6  weeks.    Ned Card ANP/GNP-BC   04/03/2022  2:22 PM

## 2022-04-05 ENCOUNTER — Ambulatory Visit
Admission: RE | Admit: 2022-04-05 | Discharge: 2022-04-05 | Disposition: A | Discharge status: Home / Self Care | Payer: BC Managed Care – PPO | Source: Ambulatory Visit | Attending: Interventional Radiology | Admitting: Interventional Radiology

## 2022-04-05 DIAGNOSIS — Z9889 Other specified postprocedural states: Secondary | ICD-10-CM | POA: Diagnosis not present

## 2022-04-05 DIAGNOSIS — N920 Excessive and frequent menstruation with regular cycle: Secondary | ICD-10-CM | POA: Diagnosis not present

## 2022-04-05 DIAGNOSIS — D259 Leiomyoma of uterus, unspecified: Secondary | ICD-10-CM

## 2022-04-05 HISTORY — PX: IR RADIOLOGIST EVAL & MGMT: IMG5224

## 2022-04-05 NOTE — Progress Notes (Addendum)
Chief Complaint: Uterine Fibroids  Referring Physician(s): Cousins,Sheronette   History of Present Illness: Jessica Dudley is a 46 y.o. female with history of uterine fibroids causing heavy bleeding requiring iron infusion and causing anemia, moderate to severe back pain, and increased bladder pressure.   She underwent left uterine artery embolization on 01/29/2022.  Right uterine artery embolization could not be performed due to vasospasm.    She reports that her bleeding has mildly improved since embolization, however remains significant.  She does not report new pain, fever, chills.   Past Medical History:  Diagnosis Date   Hx of pulmonary embolus 07/23/2017   In vitro fertilization 07/02/2016   Hematology supervision in view of previous PE 12/11/15   Iron deficiency anemia    PCOS (polycystic ovarian syndrome)    Pregnancy, high-risk 07/23/2017   Uterine fibroid    Wears glasses     Past Surgical History:  Procedure Laterality Date   CESAREAN SECTION WITH BILATERAL TUBAL LIGATION N/A 08/22/2017   Procedure: PRIMARY CESAREAN SECTION WITH BILATERAL TUBAL LIGATION;  Surgeon: Servando Salina, MD;  Location: [facility];  Service: Obstetrics;  Laterality: N/A;  Classical incision EDD: 09/12/17 Allergy: Vicodin   CHROMOPERTUBATION N/A 11/29/2015   Procedure: ABDOMINAL MYOMECTOMY;  Surgeon: Governor Specking, MD;  Location: [facility];  Service: Gynecology;  Laterality: N/A;   IR ANGIOGRAM PELVIS SELECTIVE OR SUPRASELECTIVE  01/29/2022   IR ANGIOGRAM SELECTIVE EACH ADDITIONAL VESSEL  01/29/2022   IR ANGIOGRAM SELECTIVE EACH ADDITIONAL VESSEL  01/29/2022   IR EMBO TUMOR ORGAN ISCHEMIA INFARCT INC GUIDE ROADMAPPING  01/29/2022   IR RADIOLOGIST EVAL & MGMT  11/22/2021   IR US GUIDE VASC ACCESS RIGHT  01/29/2022   MYOMECTOMY     MYOMECTOMY N/A 08/22/2017   Procedure: MYOMECTOMY;  Surgeon: Servando Salina, MD;  Location: [facility];  Service:  Obstetrics;  Laterality: N/A;   NO PAST SURGERIES      Allergies: Norco [hydrocodone-acetaminophen] and Oxycodone-acetaminophen  Medications: Prior to Admission medications   Medication Sig Start Date End Date Taking? Authorizing Provider  Cholecalciferol (VITAMIN D3) 10 MCG (400 UNIT) tablet Take by mouth.    [provider]  ferrous sulfate 325 (65 FE) MG EC tablet Take 1 tablet (325 mg total) by mouth 2 (two) times daily with a meal. 11/29/21   Ladell Pier, MD  Magnesium Oxide (MAG-OX 400 PO) Take 400 mg by mouth daily.    [provider]  traMADol (ULTRAM) 50 MG tablet Take 1 tablet (50 mg total) by mouth every 6 (six) hours as needed for severe pain. Patient not taking: Reported on 02/05/2022 01/30/22   Pasty Spillers, PA  valACYclovir (VALTREX) 500 MG tablet Take 1 tablet (500 mg total) by mouth 2 (two) times daily. For 3-5 days for outbreak Patient not taking: Reported on 03/05/2022 02/22/16   Panosh, Standley Brooking, MD  vitamin C (ASCORBIC ACID) 500 MG tablet Take 500 mg by mouth daily.    [provider]  VITAMIN E COMPLEX PO Take 1 tablet by mouth daily.    [provider]     Family History  Problem Relation Age of Onset   Hypertension Mother    Prostate cancer Paternal Uncle    Diabetes Maternal Grandmother    Leukemia Cousin    Brain cancer Cousin    Deep vein thrombosis Father    Varicose Veins Father     Social History   Socioeconomic History   Marital status:  Divorced    Spouse name: Not on file   Number of children: Not on file   Years of education: Not on file   Highest education level: Not on file  Occupational History   Not on file  Tobacco Use   Smoking status: Never   Smokeless tobacco: Never  Substance and Sexual Activity   Alcohol use: No   Drug use: No   Sexual activity: Yes    Birth control/protection: None  Other Topics Concern   Not on file  Social History Narrative   Divorced  Remarried   In vitro to  deliver in April   On lovenox      Regular exercise- no   HH of 3 with daughter no pets       daughter    Works [employer]   40 hours    MOM ruby Schiavo   Had 57 yo at home goes to day care 2022   Social Determinants of Health   Financial Resource Strain: Not on file  Food Insecurity: No Food Insecurity (01/30/2022)   Hunger Vital Sign    Worried About Running Out of Food in the Last Year: Never true    Ran Out of Food in the Last Year: Never true  Transportation Needs: No Transportation Needs (01/30/2022)   PRAPARE - Hydrologist (Medical): No    Lack of Transportation (Non-Medical): No  Physical Activity: Not on file  Stress: Not on file  Social Connections: Not on file   Review of Systems  Review of Systems: A 12 point ROS discussed and pertinent positives are indicated in the HPI above.  All other systems are negative.  Physical Exam No direct physical exam was performed (except for noted visual exam findings with Video Visits).    Vital Signs: There were no vitals taken for this visit.  Imaging: No results found.  Labs:  CBC: Recent Labs    01/29/22 1812 02/05/22 1120 03/05/22 1131 04/03/22 1308  WBC 7.8 3.4* 5.4 4.0  HGB 8.0* 8.4* 8.6* 9.0*  HCT 28.2* 28.8* 29.8* 31.2*  PLT 223 263 245 265    COAGS: Recent Labs    01/29/22 1257  INR 1.0    BMP: Recent Labs    06/12/21 1151 01/29/22 1257 01/29/22 1812  NA 137 140  --   K 3.6 3.7  --   CL 105 110  --   CO2 26 23  --   GLUCOSE 91 83  --   BUN 13 15  --   CALCIUM 9.5 9.4  --   CREATININE 0.89 0.76 0.79  GFRNONAA  --  >60 >60    LIVER FUNCTION TESTS: Recent Labs    06/12/21 1151  BILITOT 0.7  AST 14  ALT 13  ALKPHOS 39  PROT 7.4  ALBUMIN 4.6    TUMOR MARKERS: No results for input(s): "AFPTM", "CEA", "CA199", "CHROMGRNA" in the last 8760 hours.  Assessment and Plan:  46 year old woman with history of uterine fibroids causing heavy bleeding, moderate severe  back pain, increased bladder pressure underwent left uterine artery embolization on 01/29/2022.  Right uterine artery embolization could not be performed due to vasospasm.    Her bleeding symptoms have only mildly improved.  However, given the improvement, she would like to wait 2 more months to see if the bleeding continues to decrease.  If her symptoms do not sufficiently improve over the next 2 months we will consider her for repeat pelvic angiogram  and embolization of the right and if necessary the left uterine arteries.   Plan: - F/U Tele-visit in early February   Thank you for this interesting consult.  I greatly enjoyed meeting Jessica Dudley and look forward to participating in their care.  A copy of this report was sent to the requesting provider on this date.  Electronically Signed: Paula Libra Fredy Gladu 04/05/2022, 1:06 PM   I spent a total of  15 Minutes in remote  clinical consultation, greater than 50% of which was counseling/coordinating care for Uterine Fibroids.    Visit type: Audio only (telephone). Audio (no video) only due to technical limitations. Alternative for in-person consultation at [facility], [address] [place], [place], [place].  This format is felt to be most appropriate for this patient at this time.  All issues noted in this document were discussed and addressed.

## 2022-04-12 DIAGNOSIS — F4310 Post-traumatic stress disorder, unspecified: Secondary | ICD-10-CM | POA: Diagnosis not present

## 2022-04-16 ENCOUNTER — Inpatient Hospital Stay: Payer: BC Managed Care – PPO | Attending: Oncology

## 2022-04-16 VITALS — BP 113/75 | HR 75 | Temp 98.1°F | Resp 18

## 2022-04-16 DIAGNOSIS — N920 Excessive and frequent menstruation with regular cycle: Secondary | ICD-10-CM | POA: Insufficient documentation

## 2022-04-16 DIAGNOSIS — D500 Iron deficiency anemia secondary to blood loss (chronic): Secondary | ICD-10-CM | POA: Diagnosis not present

## 2022-04-16 MED ORDER — SODIUM CHLORIDE 0.9 % IV SOLN
510.0000 mg | Freq: Once | INTRAVENOUS | Status: AC
  Administered 2022-04-16: 510 mg via INTRAVENOUS
  Filled 2022-04-16: qty 17

## 2022-04-16 MED ORDER — SODIUM CHLORIDE 0.9 % IV SOLN: Freq: Once | INTRAVENOUS | Status: AC

## 2022-04-16 NOTE — Patient Instructions (Signed)

## 2022-04-19 DIAGNOSIS — F4323 Adjustment disorder with mixed anxiety and depressed mood: Secondary | ICD-10-CM | POA: Diagnosis not present

## 2022-04-23 ENCOUNTER — Inpatient Hospital Stay: Payer: BC Managed Care – PPO

## 2022-04-23 VITALS — BP 111/69 | HR 80 | Temp 98.2°F | Resp 18

## 2022-04-23 DIAGNOSIS — D500 Iron deficiency anemia secondary to blood loss (chronic): Secondary | ICD-10-CM

## 2022-04-23 DIAGNOSIS — N920 Excessive and frequent menstruation with regular cycle: Secondary | ICD-10-CM | POA: Diagnosis not present

## 2022-04-23 MED ORDER — SODIUM CHLORIDE 0.9 % IV SOLN
510.0000 mg | Freq: Once | INTRAVENOUS | Status: AC
  Administered 2022-04-23: 510 mg via INTRAVENOUS
  Filled 2022-04-23: qty 17

## 2022-04-23 MED ORDER — SODIUM CHLORIDE 0.9 % IV SOLN: Freq: Once | INTRAVENOUS | Status: AC

## 2022-04-23 NOTE — Patient Instructions (Signed)

## 2022-04-26 DIAGNOSIS — F4323 Adjustment disorder with mixed anxiety and depressed mood: Secondary | ICD-10-CM | POA: Diagnosis not present

## 2022-05-10 DIAGNOSIS — F4310 Post-traumatic stress disorder, unspecified: Secondary | ICD-10-CM | POA: Diagnosis not present

## 2022-05-15 ENCOUNTER — Inpatient Hospital Stay: Payer: BC Managed Care – PPO

## 2022-05-15 ENCOUNTER — Inpatient Hospital Stay: Payer: BC Managed Care – PPO | Admitting: Nurse Practitioner

## 2022-05-16 ENCOUNTER — Inpatient Hospital Stay: Payer: BC Managed Care – PPO | Attending: Oncology

## 2022-05-16 ENCOUNTER — Encounter: Payer: Self-pay | Admitting: Nurse Practitioner

## 2022-05-16 ENCOUNTER — Inpatient Hospital Stay (HOSPITAL_BASED_OUTPATIENT_CLINIC_OR_DEPARTMENT_OTHER): Payer: BC Managed Care – PPO | Admitting: Nurse Practitioner

## 2022-05-16 VITALS — BP 126/82 | HR 87 | Temp 98.2°F | Resp 18 | Ht 61.0 in | Wt 141.2 lb

## 2022-05-16 DIAGNOSIS — E282 Polycystic ovarian syndrome: Secondary | ICD-10-CM | POA: Insufficient documentation

## 2022-05-16 DIAGNOSIS — D500 Iron deficiency anemia secondary to blood loss (chronic): Secondary | ICD-10-CM

## 2022-05-16 DIAGNOSIS — N920 Excessive and frequent menstruation with regular cycle: Secondary | ICD-10-CM | POA: Insufficient documentation

## 2022-05-16 DIAGNOSIS — Z86711 Personal history of pulmonary embolism: Secondary | ICD-10-CM | POA: Diagnosis not present

## 2022-05-16 DIAGNOSIS — D509 Iron deficiency anemia, unspecified: Secondary | ICD-10-CM

## 2022-05-16 DIAGNOSIS — Z79899 Other long term (current) drug therapy: Secondary | ICD-10-CM | POA: Insufficient documentation

## 2022-05-16 LAB — CBC WITH DIFFERENTIAL (CANCER CENTER ONLY)
Abs Immature Granulocytes: 0.01 10*3/uL (ref 0.00–0.07)
Basophils Absolute: 0 10*3/uL (ref 0.0–0.1)
Basophils Relative: 1 %
Eosinophils Absolute: 0 10*3/uL (ref 0.0–0.5)
Eosinophils Relative: 1 %
HCT: 38.7 % (ref 36.0–46.0)
Hemoglobin: 11.2 g/dL — ABNORMAL LOW (ref 12.0–15.0)
Immature Granulocytes: 0 %
Lymphocytes Relative: 34 %
Lymphs Abs: 1.3 10*3/uL (ref 0.7–4.0)
MCH: 19.2 pg — ABNORMAL LOW (ref 26.0–34.0)
MCHC: 28.9 g/dL — ABNORMAL LOW (ref 30.0–36.0)
MCV: 66.4 fL — ABNORMAL LOW (ref 80.0–100.0)
Monocytes Absolute: 0.4 10*3/uL (ref 0.1–1.0)
Monocytes Relative: 10 %
Neutro Abs: 2.1 10*3/uL (ref 1.7–7.7)
Neutrophils Relative %: 54 %
Platelet Count: 215 10*3/uL (ref 150–400)
RBC: 5.83 MIL/uL — ABNORMAL HIGH (ref 3.87–5.11)
WBC Count: 3.9 10*3/uL — ABNORMAL LOW (ref 4.0–10.5)
nRBC: 0 % (ref 0.0–0.2)

## 2022-05-16 LAB — FERRITIN: Ferritin: 200 ng/mL (ref 11–307)

## 2022-05-16 NOTE — Progress Notes (Signed)
  [place] OFFICE PROGRESS NOTE   Diagnosis: Iron deficiency anemia  INTERVAL HISTORY:   [name] returns as scheduled.  She completed Feraheme 510 mg on 04/16/2022 and 04/23/2022.  She denies signs of allergic reaction.  She is feeling better.  She typically has headaches with her menstrual cycle but did not experience this with the most recent one.  Energy level is better.  No shortness of breath.  Menstrual cycle continues to be heavy.  Objective:  Vital signs in last 24 hours:  Blood pressure 126/82, pulse 87, temperature 98.2 F (36.8 C), temperature source Oral, resp. rate 18, height '5\' 1"'$  (1.549 m), weight 141 lb 3.2 oz (64 kg), SpO2 100 %.    HEENT: No thrush or ulcers. Resp: Lungs clear bilaterally. Cardio: Regular rate and rhythm. GI: Abdomen soft and nontender.  No hepatosplenomegaly.  Fibroids not palpable today. Vascular: No leg edema.   Lab Results:  Lab Results  Component Value Date   WBC 4.0 04/03/2022   HGB 9.0 (L) 04/03/2022   HCT 31.2 (L) 04/03/2022   MCV 59.8 (L) 04/03/2022   PLT 265 04/03/2022   NEUTROABS 2.1 04/03/2022    Imaging:  No results found.  Medications: I have reviewed the patient's current medications.  Assessment/Plan: Iron deficiency anemia Feraheme 510 mg IV 04/16/2022 and 04/23/2022 Menorrhagia secondary to uterine fibroids History of pulmonary embolus 12/11/2015, occurred after an abdominal myomectomy to resect uterine fibroids on 11/29/2015, completed Xarelto x3 months PCOS History of mild leukopenia, likely benign normal variant Status post left uterine artery embolization 01/29/2022  Disposition: Jessica Dudley appears stable.  Hemoglobin has partially corrected following IV iron.  She in general feels better.  She will continue to follow-up with Dr. Dwaine Gale, Interventional Radiology.  She will return for a CBC in 4 weeks.  We will see her in follow-up in 8 weeks.    Ned Card ANP/GNP-BC   05/16/2022  1:42  PM

## 2022-05-17 ENCOUNTER — Telehealth: Payer: Self-pay

## 2022-05-17 NOTE — Telephone Encounter (Signed)
I inform the patient her ferritin returned in normal range, 200. Patient verbal understanding and had no further questions or concerns.

## 2022-05-17 NOTE — Telephone Encounter (Signed)
-----   Message from Owens Shark, NP sent at 05/17/2022 10:01 AM EST ----- Please let her know ferritin returned in normal range, 200.  Follow-up as scheduled.

## 2022-05-31 ENCOUNTER — Other Ambulatory Visit: Payer: Self-pay | Admitting: Interventional Radiology

## 2022-05-31 DIAGNOSIS — D259 Leiomyoma of uterus, unspecified: Secondary | ICD-10-CM

## 2022-06-08 DIAGNOSIS — Z1231 Encounter for screening mammogram for malignant neoplasm of breast: Secondary | ICD-10-CM | POA: Diagnosis not present

## 2022-06-11 ENCOUNTER — Ambulatory Visit: Payer: BC Managed Care – PPO | Admitting: Family Medicine

## 2022-06-11 ENCOUNTER — Encounter: Payer: Self-pay | Admitting: Family Medicine

## 2022-06-11 VITALS — BP 112/74 | HR 78 | Temp 98.7°F | Ht 61.0 in | Wt 141.6 lb

## 2022-06-11 DIAGNOSIS — J069 Acute upper respiratory infection, unspecified: Secondary | ICD-10-CM

## 2022-06-11 DIAGNOSIS — Z20828 Contact with and (suspected) exposure to other viral communicable diseases: Secondary | ICD-10-CM

## 2022-06-11 DIAGNOSIS — J029 Acute pharyngitis, unspecified: Secondary | ICD-10-CM

## 2022-06-11 LAB — POCT INFLUENZA A/B
Influenza A, POC: NEGATIVE
Influenza B, POC: NEGATIVE

## 2022-06-11 LAB — POC COVID19 BINAXNOW: SARS Coronavirus 2 Ag: NEGATIVE

## 2022-06-11 LAB — POCT RAPID STREP A (OFFICE): Rapid Strep A Screen: NEGATIVE

## 2022-06-11 MED ORDER — OSELTAMIVIR PHOSPHATE 75 MG PO CAPS: 75.0000 mg | ORAL_CAPSULE | Freq: Every day | ORAL | 0 refills | Status: AC

## 2022-06-11 NOTE — Progress Notes (Signed)
   Established Patient Office Visit   Subjective  Patient ID: MAHA FISCHEL, female    DOB: 12/06/1975  Age: 47 y.o. MRN: [id number]  Chief Complaint  Patient presents with   Sore Throat    Pt reports sx of chills, slight headache, sore throat, slight sore on neck and fatigue. Sx started 3 days ago. Develop cough today. Taking cold/flu medicine.     Patient is a 47 year old female who is followed by Dr. Regis Bill and seen for acute concern.  Patient endorses developing fever, sore throat, nasal drainage, headache, nausea 3 days ago.  Now with cough.  Patient denies facial pain/pressure, loose stools, emesis.  Patient tried over-the-counter cough and cold medications.  Patient's bf recently diagnosed with influenza A.  Sore Throat       ROS Negative unless stated above    Objective:     BP 112/74 (BP Location: Left Arm, Patient Position: Sitting, Cuff Size: Large)   Pulse 78   Temp 98.7 F (37.1 C) (Oral)   Ht '5\' 1"'$  (1.549 m)   Wt 141 lb 9.6 oz (64.2 kg)   SpO2 99%   BMI 26.76 kg/m    Physical Exam Constitutional:      General: She is not in acute distress.    Appearance: Normal appearance.  HENT:     Head: Normocephalic and atraumatic.     Right Ear: Tympanic membrane normal.     Left Ear: Tympanic membrane normal.     Nose: Congestion present.     Mouth/Throat:     Mouth: Mucous membranes are moist.     Pharynx: Posterior oropharyngeal erythema present. No oropharyngeal exudate.  Cardiovascular:     Rate and Rhythm: Normal rate and regular rhythm.     Heart sounds: Normal heart sounds. No murmur heard.    No gallop.  Pulmonary:     Effort: Pulmonary effort is normal. No respiratory distress.     Breath sounds: Normal breath sounds. No wheezing, rhonchi or rales.  Lymphadenopathy:     Cervical: Cervical adenopathy present.  Skin:    General: Skin is warm and dry.  Neurological:     Mental Status: She is alert and oriented to person, place, and time.       Results for orders placed or performed in visit on 06/11/22  POC COVID-19 BinaxNow  Result Value Ref Range   SARS Coronavirus 2 Ag Negative Negative  POCT rapid strep A  Result Value Ref Range   Rapid Strep A Screen Negative Negative  POCT Influenza A/B  Result Value Ref Range   Influenza A, POC Negative Negative   Influenza B, POC Negative Negative      Assessment & Plan:  Viral URI with cough  Exposure to the flu -     Oseltamivir Phosphate; Take 1 capsule (75 mg total) by mouth daily for 7 days.  Dispense: 7 capsule; Refill: 0  Sore throat -     POC COVID-19 BinaxNow -     POCT rapid strep A -     POCT Influenza A/B  COVID, strep, influenza testing negative.  Given recent exposure to influenza A will start Tamiflu prophylactic dose.  Continue supportive care for acute viral nasopharyngitis with OTC cough/cold medications, hydration, Tylenol or NSAIDs, rest, etc. given strict precautions.  Return if symptoms worsen or fail to improve.   Billie Ruddy, MD

## 2022-06-11 NOTE — Patient Instructions (Addendum)
A prescription for the prophylactic dose of Tamiflu was sent to her pharmacy.  You will take 75 mg (1 tablet) daily x 7 days.  I have added some information about influenza as well as the common cold for you to review.  Please let us know if symptoms are getting worse.

## 2022-06-13 ENCOUNTER — Ambulatory Visit
Admission: RE | Admit: 2022-06-13 | Discharge: 2022-06-13 | Disposition: A | Discharge status: Home / Self Care | Payer: BC Managed Care – PPO | Source: Ambulatory Visit | Attending: Interventional Radiology | Admitting: Interventional Radiology

## 2022-06-13 ENCOUNTER — Inpatient Hospital Stay: Payer: BC Managed Care – PPO | Attending: Oncology

## 2022-06-13 DIAGNOSIS — D500 Iron deficiency anemia secondary to blood loss (chronic): Secondary | ICD-10-CM | POA: Diagnosis not present

## 2022-06-13 DIAGNOSIS — N920 Excessive and frequent menstruation with regular cycle: Secondary | ICD-10-CM | POA: Insufficient documentation

## 2022-06-13 DIAGNOSIS — D259 Leiomyoma of uterus, unspecified: Secondary | ICD-10-CM | POA: Diagnosis not present

## 2022-06-13 DIAGNOSIS — Z9889 Other specified postprocedural states: Secondary | ICD-10-CM | POA: Diagnosis not present

## 2022-06-13 HISTORY — PX: IR RADIOLOGIST EVAL & MGMT: IMG5224

## 2022-06-13 LAB — CBC WITH DIFFERENTIAL (CANCER CENTER ONLY)
Abs Immature Granulocytes: 0.01 10*3/uL (ref 0.00–0.07)
Basophils Absolute: 0 10*3/uL (ref 0.0–0.1)
Basophils Relative: 1 %
Eosinophils Absolute: 0 10*3/uL (ref 0.0–0.5)
Eosinophils Relative: 0 %
HCT: 38.4 % (ref 36.0–46.0)
Hemoglobin: 11.5 g/dL — ABNORMAL LOW (ref 12.0–15.0)
Immature Granulocytes: 0 %
Lymphocytes Relative: 36 %
Lymphs Abs: 0.9 10*3/uL (ref 0.7–4.0)
MCH: 20.4 pg — ABNORMAL LOW (ref 26.0–34.0)
MCHC: 29.9 g/dL — ABNORMAL LOW (ref 30.0–36.0)
MCV: 68 fL — ABNORMAL LOW (ref 80.0–100.0)
Monocytes Absolute: 0.4 10*3/uL (ref 0.1–1.0)
Monocytes Relative: 16 %
Neutro Abs: 1.2 10*3/uL — ABNORMAL LOW (ref 1.7–7.7)
Neutrophils Relative %: 47 %
Platelet Count: 165 10*3/uL (ref 150–400)
RBC: 5.65 MIL/uL — ABNORMAL HIGH (ref 3.87–5.11)
RDW: 25 % — ABNORMAL HIGH (ref 11.5–15.5)
WBC Count: 2.6 10*3/uL — ABNORMAL LOW (ref 4.0–10.5)
nRBC: 0 % (ref 0.0–0.2)

## 2022-06-13 LAB — FERRITIN: Ferritin: 106 ng/mL (ref 11–307)

## 2022-06-13 NOTE — Progress Notes (Signed)
Chief Complaint: Uterine Fibroids   Referring Physician(s): Cousins, Sheronette   History of Present Illness: Jessica Dudley is a 47 y.o. female with history of uterine fibroids causing heavy bleeding requiring iron infusion and causing anemia, moderate to severe back pain, and increased bladder pressure.   She underwent left uterine artery embolization on 01/29/2022. Right uterine artery embolization could not be performed due to vasospasm.    Although her bleeding has mildly improved since embolization, it remains significant.  She does not have any new symptoms.  Past Medical History:  Diagnosis Date   Hx of pulmonary embolus 07/23/2017   In vitro fertilization 07/02/2016   Hematology supervision in view of previous PE 12/11/15   Iron deficiency anemia    PCOS (polycystic ovarian syndrome)    Pregnancy, high-risk 07/23/2017   Uterine fibroid    Wears glasses     Past Surgical History:  Procedure Laterality Date   CESAREAN SECTION WITH BILATERAL TUBAL LIGATION N/A 08/22/2017   Procedure: PRIMARY CESAREAN SECTION WITH BILATERAL TUBAL LIGATION;  Surgeon: Servando Salina, MD;  Location: [facility];  Service: Obstetrics;  Laterality: N/A;  Classical incision EDD: 09/12/17 Allergy: Vicodin   CHROMOPERTUBATION N/A 11/29/2015   Procedure: ABDOMINAL MYOMECTOMY;  Surgeon: Governor Specking, MD;  Location: [facility];  Service: Gynecology;  Laterality: N/A;   IR ANGIOGRAM PELVIS SELECTIVE OR SUPRASELECTIVE  01/29/2022   IR ANGIOGRAM SELECTIVE EACH ADDITIONAL VESSEL  01/29/2022   IR ANGIOGRAM SELECTIVE EACH ADDITIONAL VESSEL  01/29/2022   IR EMBO TUMOR ORGAN ISCHEMIA INFARCT INC GUIDE ROADMAPPING  01/29/2022   IR RADIOLOGIST EVAL & MGMT  11/22/2021   IR RADIOLOGIST EVAL & MGMT  04/05/2022   IR US GUIDE VASC ACCESS RIGHT  01/29/2022   MYOMECTOMY     MYOMECTOMY N/A 08/22/2017   Procedure: MYOMECTOMY;  Surgeon: Servando Salina, MD;  Location: [facility];   Service: Obstetrics;  Laterality: N/A;   NO PAST SURGERIES      Allergies: Norco [hydrocodone-acetaminophen] and Oxycodone-acetaminophen  Medications: Prior to Admission medications   Medication Sig Start Date End Date Taking? Authorizing Provider  Cholecalciferol (VITAMIN D3) 10 MCG (400 UNIT) tablet Take by mouth.    [provider]  ferrous sulfate 325 (65 FE) MG EC tablet Take 1 tablet (325 mg total) by mouth 2 (two) times daily with a meal. 11/29/21   Ladell Pier, MD  Magnesium Oxide (MAG-OX 400 PO) Take 400 mg by mouth daily.    [provider]  oseltamivir (TAMIFLU) 75 MG capsule Take 1 capsule (75 mg total) by mouth daily for 7 days. 06/11/22 06/18/22  Billie Ruddy, MD  traMADol (ULTRAM) 50 MG tablet Take 1 tablet (50 mg total) by mouth every 6 (six) hours as needed for severe pain. Patient not taking: Reported on 02/05/2022 01/30/22   Pasty Spillers, PA  valACYclovir (VALTREX) 500 MG tablet Take 1 tablet (500 mg total) by mouth 2 (two) times daily. For 3-5 days for outbreak 02/22/16   Panosh, Standley Brooking, MD  vitamin C (ASCORBIC ACID) 500 MG tablet Take 500 mg by mouth daily.    [provider]  VITAMIN E COMPLEX PO Take 1 tablet by mouth daily.    [provider]     Family History  Problem Relation Age of Onset   Hypertension Mother    Prostate cancer Paternal Uncle    Diabetes Maternal Grandmother    Leukemia Cousin    Brain cancer Cousin  Deep vein thrombosis Father    Varicose Veins Father     Social History   Socioeconomic History   Marital status: Divorced    Spouse name: Not on file   Number of children: Not on file   Years of education: Not on file   Highest education level: Not on file  Occupational History   Not on file  Tobacco Use   Smoking status: Never   Smokeless tobacco: Never  Substance and Sexual Activity   Alcohol use: No   Drug use: No   Sexual activity: Yes    Birth control/protection: None  Other  Topics Concern   Not on file  Social History Narrative   Divorced  Remarried   In vitro to deliver in April   On lovenox      Regular exercise- no   HH of 3 with daughter no pets       daughter    Works [employer]   40 hours    MOM [name]   Had 37 yo at home goes to day care 2022   Social Determinants of Health   Financial Resource Strain: Not on file  Food Insecurity: No Food Insecurity (01/30/2022)   Hunger Vital Sign    Worried About Running Out of Food in the Last Year: Never true    Ran Out of Food in the Last Year: Never true  Transportation Needs: No Transportation Needs (01/30/2022)   PRAPARE - Hydrologist (Medical): No    Lack of Transportation (Non-Medical): No  Physical Activity: Not on file  Stress: Not on file  Social Connections: Not on file   Review of Systems  Review of Systems: A 12 point ROS discussed and pertinent positives are indicated in the HPI above.  All other systems are negative.  Physical Exam No direct physical exam was performed (except for noted visual exam findings with Video Visits).    Vital Signs: There were no vitals taken for this visit.  Imaging: No results found.  Labs:  CBC: Recent Labs    02/05/22 1120 03/05/22 1131 04/03/22 1308 05/16/22 1320  WBC 3.4* 5.4 4.0 3.9*  HGB 8.4* 8.6* 9.0* 11.2*  HCT 28.8* 29.8* 31.2* 38.7  PLT 263 245 265 215    COAGS: Recent Labs    01/29/22 1257  INR 1.0    BMP: Recent Labs    01/29/22 1257 01/29/22 1812  NA 140  --   K 3.7  --   CL 110  --   CO2 23  --   GLUCOSE 83  --   BUN 15  --   CALCIUM 9.4  --   CREATININE 0.76 0.79  GFRNONAA >60 >60    LIVER FUNCTION TESTS: No results for input(s): "BILITOT", "AST", "ALT", "ALKPHOS", "PROT", "ALBUMIN" in the last 8760 hours.  TUMOR MARKERS: No results for input(s): "AFPTM", "CEA", "CA199", "CHROMGRNA" in the last 8760 hours.  Assessment and Plan:  47 year old woman with history of uterine  fibroids causing heavy bleeding, moderate severe back pain, increased bladder pressure underwent left uterine artery embolization on 01/29/2022.  Right uterine artery embolization could not be performed due to vasospasm.    Her bleeding symptoms have only mildly improved.  I discussed with her that could re-attempt the UFE given that she still has significant bleeding during her periods.  She would like to hold off right now and explore other possible treatment options.  She will contact us if she wants  to pursue repeat UFE.  Thank you for this interesting consult.  I greatly enjoyed meeting AMIYA ESCAMILLA and look forward to participating in their care.  A copy of this report was sent to the requesting provider on this date.  Electronically Signed: Paula Libra Kline Bulthuis 06/13/2022, 11:28 AM   I spent a total of    10 Minutes in remote  clinical consultation, greater than 50% of which was counseling/coordinating care for Uterine Fibroids.    Visit type: Audio only (telephone). Audio (no video) only due to Technical Limitations. Alternative for in-person consultation at [facility], [address], [place], [place]. This visit type was conducted due to national recommendations for restrictions regarding the COVID-19 Pandemic (e.g. social distancing).  This format is felt to be most appropriate for this patient at this time.  All issues noted in this document were discussed and addressed.

## 2022-06-14 ENCOUNTER — Telehealth: Payer: Self-pay

## 2022-06-14 ENCOUNTER — Telehealth: Payer: Self-pay | Admitting: Internal Medicine

## 2022-06-14 NOTE — Telephone Encounter (Signed)
Jessica Dudley gave verbal understanding and had no further questions or concerns at this time.

## 2022-06-14 NOTE — Telephone Encounter (Signed)
-----   Message from Owens Shark, NP sent at 06/13/2022  3:20 PM EST ----- Please let her know hemoglobin is stable to improved.  Follow-up as scheduled.

## 2022-06-14 NOTE — Telephone Encounter (Signed)
Spoke to pt. Inform patient of Dr. Volanda Napoleon' advise. She inform she is still having bodyache, chills , fever-101(last night), sinus pressure. Had a new sx of vomitting and diarrhea that started last night. Still taking medication for flu. Altering between motrin and tylenol for fever and body ache. Pt reports she is feeling feverish as we speak. No appetite.   Please advise.

## 2022-06-14 NOTE — Telephone Encounter (Signed)
Symptoms of viral illness can last 7-10 days or longer.  No additional prescription medications indicated unless patient is having new symptoms.  Can schedule appointment with PCP for further evaluation if needed.

## 2022-06-14 NOTE — Telephone Encounter (Signed)
Saw Banks 06/11/22, says symptoms have not improved. Asking does she need additional treatment or to be retested

## 2022-06-15 NOTE — Telephone Encounter (Signed)
As previously advised, pt should be seen in person if developing new symptoms/feeling worse.

## 2022-06-19 NOTE — Telephone Encounter (Signed)
Attempt to reach pt. Left a voicemail to call us back.

## 2022-06-19 NOTE — Telephone Encounter (Signed)
Patient returned call

## 2022-06-19 NOTE — Telephone Encounter (Signed)
Spoke to pt. Pt reports she is feeling a little bit better, still not feeling well.  Pt reports she is not having any more of diarrhea and vomitting. Offer to schedule a follow up appt with Dr. Regis Bill. Pt states she will wait and see how she is doing by Thursday. If need it, she will call and schedule a follow up appt.

## 2022-06-20 ENCOUNTER — Ambulatory Visit: Payer: BC Managed Care – PPO | Admitting: Family Medicine

## 2022-06-20 ENCOUNTER — Encounter: Payer: Self-pay | Admitting: Family Medicine

## 2022-06-20 VITALS — BP 110/72 | HR 86 | Temp 98.9°F | Wt 137.4 lb

## 2022-06-20 DIAGNOSIS — J019 Acute sinusitis, unspecified: Secondary | ICD-10-CM | POA: Diagnosis not present

## 2022-06-20 DIAGNOSIS — J029 Acute pharyngitis, unspecified: Secondary | ICD-10-CM

## 2022-06-20 DIAGNOSIS — R059 Cough, unspecified: Secondary | ICD-10-CM | POA: Diagnosis not present

## 2022-06-20 LAB — POCT RAPID STREP A (OFFICE): Rapid Strep A Screen: NEGATIVE

## 2022-06-20 LAB — POC COVID19 BINAXNOW: SARS Coronavirus 2 Ag: NEGATIVE

## 2022-06-20 MED ORDER — METHYLPREDNISOLONE 4 MG PO TBPK: ORAL_TABLET | ORAL | 0 refills | Status: DC

## 2022-06-20 MED ORDER — AMOXICILLIN-POT CLAVULANATE 875-125 MG PO TABS: 1.0000 | ORAL_TABLET | Freq: Two times a day (BID) | ORAL | 0 refills | Status: DC

## 2022-06-20 NOTE — Progress Notes (Signed)
   Subjective:    Patient ID: Jessica Dudley, female    DOB: 1976-02-23, 47 y.o.   MRN: [id number]  HPI Here for 2 weeks of symptoms that began with a headache, body aches, a ST, and a dry cough. No fever or NVD. She was seen her on 06-11-22 and she tested negative for influenza. However she had been exposed to her boyfriend who had tested positive for influenza A, so she was given a 7 day course of Tamiflu. She took 4 days of this and stopped because she was feeling  a little better. Now for the last 5 days she feels worse again with more head pressure and pain in the ears. She is blowing green mucus from the nose. She has been taking Mucinex.    Review of Systems  Constitutional: Negative.   HENT:  Positive for congestion, ear pain, postnasal drip, sinus pressure and sore throat.   Eyes: Negative.   Respiratory:  Positive for cough. Negative for shortness of breath and wheezing.        Objective:   Physical Exam Constitutional:      Appearance: She is ill-appearing.  HENT:     Right Ear: Tympanic membrane, ear canal and external ear normal.     Left Ear: Tympanic membrane, ear canal and external ear normal.     Nose: Nose normal.     Mouth/Throat:     Pharynx: Oropharynx is clear.  Eyes:     Conjunctiva/sclera: Conjunctivae normal.  Pulmonary:     Effort: Pulmonary effort is normal.     Breath sounds: Normal breath sounds.  Lymphadenopathy:     Cervical: No cervical adenopathy.  Neurological:     Mental Status: She is alert.           Assessment & Plan:  She had a viral illness that is now complicated by a sinus infection. Treat with 10 days of Augmentin and a Medrol dose pack.  Alysia Penna, MD

## 2022-06-21 LAB — POCT INFLUENZA A/B
Influenza A, POC: NEGATIVE
Influenza B, POC: NEGATIVE

## 2022-06-21 NOTE — Addendum Note (Signed)
Addended by: Wyvonne Lenz on: 06/21/2022 08:13 AM   Modules accepted: Orders

## 2022-07-02 DIAGNOSIS — F4310 Post-traumatic stress disorder, unspecified: Secondary | ICD-10-CM | POA: Diagnosis not present

## 2022-07-05 ENCOUNTER — Encounter: Payer: BC Managed Care – PPO | Admitting: Internal Medicine

## 2022-07-10 ENCOUNTER — Telehealth: Payer: Self-pay | Admitting: *Deleted

## 2022-07-10 NOTE — Telephone Encounter (Signed)
Jessica Dudley called and states she is no longer having the heavy bleeding and headaches from her cycle. She has actually stopped bleeding and will not be interested in a future UFE./vm

## 2022-07-11 ENCOUNTER — Inpatient Hospital Stay: Payer: BC Managed Care – PPO | Admitting: Nurse Practitioner

## 2022-07-11 ENCOUNTER — Inpatient Hospital Stay: Payer: BC Managed Care – PPO | Attending: Oncology

## 2022-07-11 DIAGNOSIS — N920 Excessive and frequent menstruation with regular cycle: Secondary | ICD-10-CM | POA: Insufficient documentation

## 2022-07-11 DIAGNOSIS — D500 Iron deficiency anemia secondary to blood loss (chronic): Secondary | ICD-10-CM | POA: Insufficient documentation

## 2022-07-11 DIAGNOSIS — R1905 Periumbilic swelling, mass or lump: Secondary | ICD-10-CM | POA: Insufficient documentation

## 2022-07-11 DIAGNOSIS — Z86711 Personal history of pulmonary embolism: Secondary | ICD-10-CM | POA: Diagnosis not present

## 2022-07-11 DIAGNOSIS — E282 Polycystic ovarian syndrome: Secondary | ICD-10-CM | POA: Insufficient documentation

## 2022-07-11 DIAGNOSIS — D259 Leiomyoma of uterus, unspecified: Secondary | ICD-10-CM | POA: Diagnosis not present

## 2022-07-11 LAB — CBC WITH DIFFERENTIAL (CANCER CENTER ONLY)
Abs Immature Granulocytes: 0.01 10*3/uL (ref 0.00–0.07)
Basophils Absolute: 0 10*3/uL (ref 0.0–0.1)
Basophils Relative: 1 %
Eosinophils Absolute: 0 10*3/uL (ref 0.0–0.5)
Eosinophils Relative: 1 %
HCT: 36.4 % (ref 36.0–46.0)
Hemoglobin: 11 g/dL — ABNORMAL LOW (ref 12.0–15.0)
Immature Granulocytes: 0 %
Lymphocytes Relative: 30 %
Lymphs Abs: 1.4 10*3/uL (ref 0.7–4.0)
MCH: 20.9 pg — ABNORMAL LOW (ref 26.0–34.0)
MCHC: 30.2 g/dL (ref 30.0–36.0)
MCV: 69.2 fL — ABNORMAL LOW (ref 80.0–100.0)
Monocytes Absolute: 0.5 10*3/uL (ref 0.1–1.0)
Monocytes Relative: 10 %
Neutro Abs: 2.6 10*3/uL (ref 1.7–7.7)
Neutrophils Relative %: 58 %
Platelet Count: 187 10*3/uL (ref 150–400)
RBC: 5.26 MIL/uL — ABNORMAL HIGH (ref 3.87–5.11)
RDW: 20.8 % — ABNORMAL HIGH (ref 11.5–15.5)
WBC Count: 4.5 10*3/uL (ref 4.0–10.5)
nRBC: 0 % (ref 0.0–0.2)

## 2022-07-11 LAB — FERRITIN: Ferritin: 44 ng/mL (ref 11–307)

## 2022-07-16 ENCOUNTER — Inpatient Hospital Stay: Payer: BC Managed Care – PPO | Admitting: Nurse Practitioner

## 2022-07-16 ENCOUNTER — Encounter: Payer: Self-pay | Admitting: Nurse Practitioner

## 2022-07-16 VITALS — BP 122/67 | HR 83 | Temp 98.1°F | Resp 18 | Ht 61.0 in | Wt 141.8 lb

## 2022-07-16 DIAGNOSIS — N920 Excessive and frequent menstruation with regular cycle: Secondary | ICD-10-CM | POA: Diagnosis not present

## 2022-07-16 DIAGNOSIS — D500 Iron deficiency anemia secondary to blood loss (chronic): Secondary | ICD-10-CM

## 2022-07-16 DIAGNOSIS — E282 Polycystic ovarian syndrome: Secondary | ICD-10-CM | POA: Diagnosis not present

## 2022-07-16 DIAGNOSIS — Z86711 Personal history of pulmonary embolism: Secondary | ICD-10-CM | POA: Diagnosis not present

## 2022-07-16 DIAGNOSIS — D259 Leiomyoma of uterus, unspecified: Secondary | ICD-10-CM | POA: Diagnosis not present

## 2022-07-16 DIAGNOSIS — R1905 Periumbilic swelling, mass or lump: Secondary | ICD-10-CM | POA: Diagnosis not present

## 2022-07-16 NOTE — Progress Notes (Signed)
  [facility] OFFICE PROGRESS NOTE   Diagnosis: Iron deficiency anemia  INTERVAL HISTORY:   Jessica Dudley returns for follow-up.  She reports menstrual cycle last month was light.  She has not had a menstrual cycle this month.  No other bleeding.  She notes breast are "tender and ovaries are "sore".  Energy level is good.  She intermittently notes palpable fullness above the umbilicus.  Objective:  Vital signs in last 24 hours:  Blood pressure 122/67, pulse 83, temperature 98.1 F (36.7 C), temperature source Oral, resp. rate 18, height 5\' 1"  (1.549 m), weight 141 lb 12.8 oz (64.3 kg), SpO2 100 %.     Resp: Lungs clear bilaterally. Cardio: Regular rate and rhythm. GI: Abdomen soft and nontender.  Superficial soft rounded fullness just superior to the umbilicus.  Palpable firm fullness at the low abdomen. Vascular: No leg edema.  Lab Results:  Lab Results  Component Value Date   WBC 4.5 07/11/2022   HGB 11.0 (L) 07/11/2022   HCT 36.4 07/11/2022   MCV 69.2 (L) 07/11/2022   PLT 187 07/11/2022   NEUTROABS 2.6 07/11/2022    Imaging:  No results found.  Medications: I have reviewed the patient's current medications.  Assessment/Plan: Iron deficiency anemia Feraheme 510 mg IV 04/16/2022 and 04/23/2022 Menorrhagia secondary to uterine fibroids History of pulmonary embolus 12/11/2015, occurred after an abdominal myomectomy to resect uterine fibroids on 11/29/2015, completed Xarelto x3 months PCOS History of mild leukopenia, likely benign normal variant Status post left uterine artery embolization 01/29/2022  Disposition: [name] remains stable from a hematologic standpoint.  Hemoglobin is stable, ferritin in normal range.  Over the past 2 months she has noted a marked improvement in menorrhagia.  She will return for a follow-up CBC and ferritin in 1 month.  She understands to contact the office if she develops on/symptoms suggestive of progressive anemia.  The  fullness just above the umbilicus may be a hernia.  She will ask Dr. Regis Bill to evaluate at an upcoming visit and also ask Dr. Dwaine Gale to review the pelvic MRI from 4/65/6812, question umbilical hernia.  She will return for lab in 4 weeks.  We will see her in follow-up in 8 weeks.  Patient seen with Dr. Benay Spice.    Ned Card ANP/GNP-BC   07/16/2022  10:32 AM  This was a shared visit with Ned Card.  Jessica Dudley was interviewed and examined.  The palpable fullness superior to the umbilicus is likely a hernia.  She will ask Dr. Regis Bill to evaluate this and Dr. Dwaine Gale to review this area on the July 2023 MRI.  Hopefully the anemia will correct following the uterine embolization procedure.  I was present for greater than 50% of today's visit.  I performed medical decision making.  Julieanne Manson, MD

## 2022-07-17 ENCOUNTER — Inpatient Hospital Stay: Payer: BC Managed Care – PPO | Admitting: Nurse Practitioner

## 2022-07-27 DIAGNOSIS — F4310 Post-traumatic stress disorder, unspecified: Secondary | ICD-10-CM | POA: Diagnosis not present

## 2022-08-02 ENCOUNTER — Encounter: Payer: Self-pay | Admitting: Internal Medicine

## 2022-08-02 ENCOUNTER — Ambulatory Visit (INDEPENDENT_AMBULATORY_CARE_PROVIDER_SITE_OTHER): Payer: BC Managed Care – PPO | Admitting: Internal Medicine

## 2022-08-02 VITALS — BP 110/80 | HR 68 | Temp 97.8°F | Ht 60.5 in | Wt 142.7 lb

## 2022-08-02 DIAGNOSIS — Z13220 Encounter for screening for lipoid disorders: Secondary | ICD-10-CM | POA: Diagnosis not present

## 2022-08-02 DIAGNOSIS — Z79899 Other long term (current) drug therapy: Secondary | ICD-10-CM | POA: Diagnosis not present

## 2022-08-02 DIAGNOSIS — D500 Iron deficiency anemia secondary to blood loss (chronic): Secondary | ICD-10-CM

## 2022-08-02 DIAGNOSIS — Z0000 Encounter for general adult medical examination without abnormal findings: Secondary | ICD-10-CM | POA: Diagnosis not present

## 2022-08-02 DIAGNOSIS — E611 Iron deficiency: Secondary | ICD-10-CM

## 2022-08-02 DIAGNOSIS — N760 Acute vaginitis: Secondary | ICD-10-CM | POA: Diagnosis not present

## 2022-08-02 DIAGNOSIS — R229 Localized swelling, mass and lump, unspecified: Secondary | ICD-10-CM | POA: Diagnosis not present

## 2022-08-02 DIAGNOSIS — Z1159 Encounter for screening for other viral diseases: Secondary | ICD-10-CM

## 2022-08-02 LAB — BASIC METABOLIC PANEL
BUN: 8 mg/dL (ref 6–23)
CO2: 27 mEq/L (ref 19–32)
Calcium: 9.3 mg/dL (ref 8.4–10.5)
Chloride: 106 mEq/L (ref 96–112)
Creatinine, Ser: 0.85 mg/dL (ref 0.40–1.20)
GFR: 82.53 mL/min (ref >60.00)
Glucose, Bld: 84 mg/dL (ref 70–99)
Potassium: 3.8 mEq/L (ref 3.5–5.1)
Sodium: 139 mEq/L (ref 135–145)

## 2022-08-02 LAB — HEPATIC FUNCTION PANEL
ALT: 19 U/L (ref 0–35)
AST: 16 U/L (ref 0–37)
Albumin: 4.4 g/dL (ref 3.5–5.2)
Alkaline Phosphatase: 42 U/L (ref 39–117)
Bilirubin, Direct: 0.1 mg/dL (ref 0.0–0.3)
Total Bilirubin: 0.4 mg/dL (ref 0.2–1.2)
Total Protein: 7.1 g/dL (ref 6.0–8.3)

## 2022-08-02 LAB — LIPID PANEL
Cholesterol: 194 mg/dL (ref 0–200)
HDL: 73 mg/dL (ref >39.00)
LDL Cholesterol: 109 mg/dL — ABNORMAL HIGH (ref 0–99)
NonHDL: 121.29
Total CHOL/HDL Ratio: 3
Triglycerides: 60 mg/dL (ref 0.0–149.0)
VLDL: 12 mg/dL (ref 0.0–40.0)

## 2022-08-02 LAB — TSH: TSH: 1.01 u[IU]/mL (ref 0.35–5.50)

## 2022-08-02 MED ORDER — FLUCONAZOLE 150 MG PO TABS: 150.0000 mg | ORAL_TABLET | Freq: Once | ORAL | 0 refills | Status: AC

## 2022-08-02 NOTE — Progress Notes (Signed)
Chief Complaint  Patient presents with   Annual Exam    HPI: Patient  Jessica Dudley  47 y.o. comes in today for  [place] visit   Hematology evaluated   Iron def anmeia  past hx of PE  last hg this month up to 11  [place] for  fibroids  9 23 sca to now bleeding since   had colon negative . Fu labs next month Eye checking  feels allergy sx  Had antibiotic recent for sinus infection and has sx of years infection: asks for  diflucan if possible  Check nodule left axilla non tender   for about 2 years  but feels firm  please check  will alos have derm see . No sx  Health Maintenance  Topic Date Due   COVID-19 Vaccine (3 - Pfizer risk series) 10/03/2019   PAP SMEAR-Modifier  10/20/2021   INFLUENZA VACCINE  12/05/2021   DTaP/Tdap/Td (3 - Td or Tdap) 06/13/2031   COLONOSCOPY (Pts 45-78yrs Insurance coverage will need to be confirmed)  01/02/2032   Hepatitis C Screening  Completed   HIV Screening  Completed   HPV VACCINES  Aged Out   Health Maintenance Review LIFESTYLE:  Exercise:  not yet  Tobacco/ETS: no Alcohol: wine Sugar beverages: cranberry juice  Sleep: 11- 6  Drug use: no HH of  3 no pets  Work: 40 hours per week    ROS:  GEN/ HEENT: No fever, significant weight changes sweats headaches vision problems hearing changes, CV/ PULM; No chest pain shortness of breath cough, syncope,edema  change in exercise tolerance. GI /GU: No adominal pain, vomiting, change in bowel habits. No blood in the stool. SKIN/HEME: ,no acute skin rashes suspicious lesions or bleeding. No lymphadenopathy, nodules, masses.  NEURO/ PSYCH:  No neurologic signs such as weakness numbness. No depression anxiety. IMM/ Allergy: No unusual infections.  Allergy .   REST of 12 system review negative except as per HPI   Past Medical History:  Diagnosis Date   Hx of pulmonary embolus 07/23/2017   In vitro fertilization 07/02/2016   Hematology supervision in view of previous PE 12/11/15   Iron  deficiency anemia    PCOS (polycystic ovarian syndrome)    Pregnancy, high-risk 07/23/2017   Uterine fibroid    Wears glasses     Past Surgical History:  Procedure Laterality Date   CESAREAN SECTION WITH BILATERAL TUBAL LIGATION N/A 08/22/2017   Procedure: PRIMARY CESAREAN SECTION WITH BILATERAL TUBAL LIGATION;  Surgeon: Jessica Salina, MD;  Location: [facility];  Service: Obstetrics;  Laterality: N/A;  Classical incision EDD: 09/12/17 Allergy: Vicodin   CHROMOPERTUBATION N/A 11/29/2015   Procedure: ABDOMINAL MYOMECTOMY;  Surgeon: Jessica Specking, MD;  Location: [facility];  Service: Gynecology;  Laterality: N/A;   IR ANGIOGRAM PELVIS SELECTIVE OR SUPRASELECTIVE  01/29/2022   IR ANGIOGRAM SELECTIVE EACH ADDITIONAL VESSEL  01/29/2022   IR ANGIOGRAM SELECTIVE EACH ADDITIONAL VESSEL  01/29/2022   IR EMBO TUMOR ORGAN ISCHEMIA INFARCT INC GUIDE ROADMAPPING  01/29/2022   IR RADIOLOGIST EVAL & MGMT  11/22/2021   IR RADIOLOGIST EVAL & MGMT  04/05/2022   IR RADIOLOGIST EVAL & MGMT  06/13/2022   IR US GUIDE VASC ACCESS RIGHT  01/29/2022   MYOMECTOMY     MYOMECTOMY N/A 08/22/2017   Procedure: MYOMECTOMY;  Surgeon: Jessica Salina, MD;  Location: [facility];  Service: Obstetrics;  Laterality: N/A;   NO PAST SURGERIES      Family History  Problem Relation Age of Onset   Hypertension Mother    Prostate cancer Paternal Uncle    Diabetes Maternal Grandmother    Leukemia Cousin    Brain cancer Cousin    Deep vein thrombosis Father    Varicose Veins Father     Social History   Socioeconomic History   Marital status: Divorced    Spouse name: Not on file   Number of children: Not on file   Years of education: Not on file   Highest education level: Not on file  Occupational History   Not on file  Tobacco Use   Smoking status: Never   Smokeless tobacco: Never  Substance and Sexual Activity   Alcohol use: No   Drug use: No   Sexual activity: Yes    Birth  control/protection: None  Other Topics Concern   Not on file  Social History Narrative   Divorced  Remarried   In vitro to deliver in April   On lovenox      Regular exercise- no   HH of 3 with daughter no pets       daughter    Works [employer]   40 hours    MOM Jessica Dudley   Had 37 yo at home goes to day care 2022   Social Determinants of Health   Financial Resource Strain: Not on file  Food Insecurity: No Food Insecurity (01/30/2022)   Hunger Vital Sign    Worried About Running Out of Food in the Last Year: Never true    [place] in the Last Year: Never true  Transportation Needs: No Transportation Needs (01/30/2022)   PRAPARE - Hydrologist (Medical): No    Lack of Transportation (Non-Medical): No  Physical Activity: Not on file  Stress: Not on file  Social Connections: Not on file    Outpatient Medications Prior to Visit  Medication Sig Dispense Refill   Cholecalciferol (VITAMIN D3) 10 MCG (400 UNIT) tablet Take by mouth.     ferrous sulfate 325 (65 FE) MG EC tablet Take 1 tablet (325 mg total) by mouth 2 (two) times daily with a meal.  3   Magnesium Oxide (MAG-OX 400 PO) Take 400 mg by mouth daily.     valACYclovir (VALTREX) 500 MG tablet Take 1 tablet (500 mg total) by mouth 2 (two) times daily. For 3-5 days for outbreak 30 tablet 5   vitamin C (ASCORBIC ACID) 500 MG tablet Take 500 mg by mouth daily.     VITAMIN E COMPLEX PO Take 1 tablet by mouth daily.     traMADol (ULTRAM) 50 MG tablet Take 1 tablet (50 mg total) by mouth every 6 (six) hours as needed for severe pain. 20 tablet 0   amoxicillin-clavulanate (AUGMENTIN) 875-125 MG tablet Take 1 tablet by mouth 2 (two) times daily. 20 tablet 0   methylPREDNISolone (MEDROL DOSEPAK) 4 MG TBPK tablet As directed 21 tablet 0   No facility-administered medications prior to visit.     EXAM:  BP 110/80 (BP Location: Left Arm, Patient Position: Sitting, Cuff Size: Normal)   Pulse 68   Temp 97.8  F (36.6 C) (Oral)   Ht 5' 0.5" (1.537 m)   Wt 142 lb 11.2 oz (64.7 kg)   LMP 07/24/2022 (Exact Date)   SpO2 100%   BMI 27.41 kg/m   Body mass index is 27.41 kg/m. Wt Readings from Last 3 Encounters:  08/02/22 142 lb 11.2 oz (64.7  kg)  07/16/22 141 lb 12.8 oz (64.3 kg)  06/20/22 137 lb 6.4 oz (62.3 kg)    Physical Exam: Vital signs reviewed [id number] is a well-developed well-nourished alert cooperative    who appearsr stated age in no acute distress.  HEENT: normocephalic atraumatic , Eyes: PERRL EOM's full, conjunctiva clear, Nares: paten,t no deformity discharge or tenderness., Ears: no deformity EAC's clear TMs with normal landmarks. Mouth: clear OP, no lesions, edema.  Moist mucous membranes. Dentition in adequate repair. NECK: supple without masses, thyromegaly or bruits. CHEST/PULM:  Clear to auscultation and percussion breath sounds equal no wheeze , rales or rhonchi. No chest wall deformities or tenderness. Breast: normal by inspection . No dimpling, discharge, masses, tenderness or discharge . CV: PMI is nondisplaced, S1 S2 no gallops, murmurs, rubs. Peripheral pulses are full without delay.No JVD .  ABDOMEN: Bowel sounds normal nontender  No guard or rebound, no hepato splenomegal no CVA tenderness. Extremtities:  No clubbing cyanosis or edema, no acute joint swelling or redness no focal atrophy NEURO:  Oriented x3, cranial nerves 3-12 appear to be intact, no obvious focal weakness,gait within normal limits no abnormal reflexes or asymmetrical SKIN: No acute rashes normal turgor, color, no bruising or petechiae. PSYCH: Oriented, good eye contact, no obvious depression anxiety, cognition and judgment appear normal. LN: no cervical axillary inguinal adenopathy axilla  pevvle marble feeling round smootha nd mobile left axilla   superfical  and no adenopathy  no skin change   Lab Results  Component Value Date   WBC 4.5 07/11/2022   HGB 11.0 (L) 07/11/2022   HCT 36.4  07/11/2022   PLT 187 07/11/2022   GLUCOSE 84 08/02/2022   CHOL 194 08/02/2022   TRIG 60.0 08/02/2022   HDL 73.00 08/02/2022   LDLCALC 109 (H) 08/02/2022   ALT 19 08/02/2022   AST 16 08/02/2022   NA 139 08/02/2022   K 3.8 08/02/2022   CL 106 08/02/2022   CREATININE 0.85 08/02/2022   BUN 8 08/02/2022   CO2 27 08/02/2022   TSH 1.01 08/02/2022   INR 1.0 01/29/2022   HGBA1C 5.6 06/12/2021    BP Readings from Last 3 Encounters:  08/02/22 110/80  07/16/22 122/67  06/20/22 110/72    Lab plan reviewed with patient   ASSESSMENT AND PLAN:  Discussed the following assessment and plan:    ICD-10-CM   1. Visit for preventive health examination  Z00.00 Lipid panel    TSH    Basic metabolic panel    Hepatic function panel    Hep C Antibody    2. Iron deficiency  E61.1 Lipid panel    TSH    Basic metabolic panel    Hepatic function panel   hopefully improving after  loss of gyne blood loss    3. Skin nodule axillary  R22.9    sub cutaneous axill a  poss cyst .less likely node    follow stable .have derm check also    4. Acute vaginitis  N76.0    presumed yeast  after antibiotic    5. Medication management  Z79.899 Lipid panel    TSH    Basic metabolic panel    Hepatic function panel    6. Screening, lipid  Z13.220 Lipid panel    TSH    Basic metabolic panel    Hepatic function panel    7. Need for hepatitis C screening test  Z11.59 Hep C Antibody    8. Iron deficiency anemia due to  chronic blood loss  D50.0 Ferritin    CBC with Differential/Platelet    CBC with Differential/Platelets    Ferritin    CANCELED: Ferritin    CANCELED: CBC with Differential (Cancer Center Only)    CANCELED: Ferritin    CANCELED: CBC with Differential (Cancer Center Only)     Return in about 1 year (around 08/02/2023) for depending on results.  Patient Care Team: Carlynn Leduc, Standley Brooking, MD as PCP - General Beryle Beams Alyson Locket, MD as Consulting Physician (Oncology) Jessica Specking, MD  as Consulting Physician (Obstetrics and Gynecology) Jessica Salina, MD as Consulting Physician (Obstetrics and Gynecology) Patient Instructions  Good to see you today . Will send in diflucan to your pharmacy as planned.  Nodule  may be a cyst   is round and movable  if stable just watch and  have derm check also .     Standley Brooking. Meelah Tallo M.D.

## 2022-08-02 NOTE — Patient Instructions (Signed)
Good to see you today . Will send in diflucan to your pharmacy as planned.  Nodule  may be a cyst   is round and movable  if stable just watch and  have derm check also .

## 2022-08-03 LAB — HEPATITIS C ANTIBODY: Hepatitis C Ab: NONREACTIVE

## 2022-08-03 NOTE — Progress Notes (Signed)
Results normal  except ldl cholesterol slgihtly higher than last year .  Attention to lifestyle intervention healthy eating and exercise .  Hematology to fu on your iron deficiency  expected to improve.

## 2022-08-10 ENCOUNTER — Other Ambulatory Visit: Payer: Self-pay

## 2022-08-10 ENCOUNTER — Encounter (HOSPITAL_BASED_OUTPATIENT_CLINIC_OR_DEPARTMENT_OTHER): Payer: Self-pay

## 2022-08-10 ENCOUNTER — Emergency Department (HOSPITAL_BASED_OUTPATIENT_CLINIC_OR_DEPARTMENT_OTHER)
Admission: EM | Admit: 2022-08-10 | Discharge: 2022-08-10 | Disposition: A | Discharge status: Home / Self Care | Payer: BC Managed Care – PPO | Attending: Emergency Medicine | Admitting: Emergency Medicine

## 2022-08-10 DIAGNOSIS — R1915 Other abnormal bowel sounds: Secondary | ICD-10-CM | POA: Insufficient documentation

## 2022-08-10 DIAGNOSIS — R110 Nausea: Secondary | ICD-10-CM | POA: Diagnosis not present

## 2022-08-10 DIAGNOSIS — R1030 Lower abdominal pain, unspecified: Secondary | ICD-10-CM | POA: Diagnosis not present

## 2022-08-10 LAB — COMPREHENSIVE METABOLIC PANEL
ALT: 18 U/L (ref 0–44)
AST: 16 U/L (ref 15–41)
Albumin: 4.2 g/dL (ref 3.5–5.0)
Alkaline Phosphatase: 42 U/L (ref 38–126)
Anion gap: 5 (ref 5–15)
BUN: 9 mg/dL (ref 6–20)
CO2: 23 mmol/L (ref 22–32)
Calcium: 8.5 mg/dL — ABNORMAL LOW (ref 8.9–10.3)
Chloride: 105 mmol/L (ref 98–111)
Creatinine, Ser: 0.83 mg/dL (ref 0.44–1.00)
GFR, Estimated: >60 mL/min (ref >60)
Glucose, Bld: 70 mg/dL (ref 70–99)
Potassium: 3.8 mmol/L (ref 3.5–5.1)
Sodium: 133 mmol/L — ABNORMAL LOW (ref 135–145)
Total Bilirubin: 0.5 mg/dL (ref 0.3–1.2)
Total Protein: 7.2 g/dL (ref 6.5–8.1)

## 2022-08-10 LAB — CBC
HCT: 39.2 % (ref 36.0–46.0)
Hemoglobin: 12 g/dL (ref 12.0–15.0)
MCH: 21.7 pg — ABNORMAL LOW (ref 26.0–34.0)
MCHC: 30.6 g/dL (ref 30.0–36.0)
MCV: 71 fL — ABNORMAL LOW (ref 80.0–100.0)
Platelets: 208 10*3/uL (ref 150–400)
RBC: 5.52 MIL/uL — ABNORMAL HIGH (ref 3.87–5.11)
RDW: 15.7 % — ABNORMAL HIGH (ref 11.5–15.5)
WBC: 5.1 10*3/uL (ref 4.0–10.5)
nRBC: 0 % (ref 0.0–0.2)

## 2022-08-10 LAB — URINALYSIS, ROUTINE W REFLEX MICROSCOPIC
Bilirubin Urine: NEGATIVE
Glucose, UA: NEGATIVE mg/dL
Hgb urine dipstick: NEGATIVE
Ketones, ur: NEGATIVE mg/dL
Leukocytes,Ua: NEGATIVE
Nitrite: NEGATIVE
Protein, ur: NEGATIVE mg/dL
Specific Gravity, Urine: 1.02 (ref 1.005–1.030)
pH: 7.5 (ref 5.0–8.0)

## 2022-08-10 LAB — LIPASE, BLOOD: Lipase: 31 U/L (ref 11–51)

## 2022-08-10 LAB — PREGNANCY, URINE: Preg Test, Ur: NEGATIVE

## 2022-08-10 MED ORDER — NAPROXEN 375 MG PO TABS: 375.0000 mg | ORAL_TABLET | Freq: Two times a day (BID) | ORAL | 0 refills | Status: AC

## 2022-08-10 MED ORDER — NAPROXEN 250 MG PO TABS
500.0000 mg | ORAL_TABLET | Freq: Once | ORAL | Status: AC
  Administered 2022-08-10: 500 mg via ORAL
  Filled 2022-08-10: qty 2

## 2022-08-10 NOTE — ED Notes (Signed)
Pt. Reports she was awake with nausea at 1am and the abd. Pain was severe in the lower abd. And caused pain into the vagina.  Pt. Reports she felt pain into her buttocks as well.  She reports the pain lasted approx. 30 to 45 mins then it tapered off.  Pt. Took ibuprofen.  Pt. Reports she has been urinating more than normal.  No blood visible in urine she reports.  Pt. Reports she had a female procedure done in Sept. For fibroids in her uterus.

## 2022-08-10 NOTE — ED Triage Notes (Signed)
States had a "strong lower abdominal pain" at 0100 this morning, states now just sore. Now feels bloated & nauseous.

## 2022-08-10 NOTE — ED Provider Notes (Signed)
[facility] Provider Note   CSN: [id number] Arrival date & time: 08/10/22  1151     History Ovarian Cyst Chief Complaint  Patient presents with   Abdominal Pain    CATHEE HEASLIP is a 47 y.o. female.  47 y.o female with a PMH of ovarian cyst presents to the ED with a chief complaint of lower abdominal pain which began around 1 AM this morning.  She reports waking up with severe lower abdominal pain, describing as a bloating sensation, excruciating pain.  She reports she walked to the first floor and took some ibuprofen which helped her pass some gas.  She reports the pain lasted for about 30 to 45 minutes and she was in tears when this occurred.  She does have a prior history of ovarian cyst, with a prior ruptured and she was concern for this.  She did call her gynecologist who told her to be evaluated in the emergency department.  Her last bowel movement was yesterday and this was normal as she takes daily magnesium.  There is no alleviating or exacerbating factors.  She now reports that her pain is better she continues to feel sore along the lower abdomen, she did leave work in order to be seen in the ER.  She does endorse some urinary frequency but no dysuria.  No vaginal discharge, no fever, no nausea or vomiting.  The history is provided by the patient.  Abdominal Pain Pain location:  Suprapubic Associated symptoms: nausea   Associated symptoms: no chest pain, no chills, no constipation, no fever, no shortness of breath and no vomiting        Home Medications Prior to Admission medications   Medication Sig Start Date End Date Taking? Authorizing Provider  naproxen (NAPROSYN) 375 MG tablet Take 1 tablet (375 mg total) by mouth 2 (two) times daily for 7 days. 08/10/22 08/17/22 Yes Ezell Melikian, Leonie Douglas, PA-C  Cholecalciferol (VITAMIN D3) 10 MCG (400 UNIT) tablet Take by mouth.    [provider]  ferrous sulfate 325 (65 FE) MG EC tablet  Take 1 tablet (325 mg total) by mouth 2 (two) times daily with a meal. 11/29/21   Ladene Artist, MD  Magnesium Oxide (MAG-OX 400 PO) Take 400 mg by mouth daily.    [provider]  valACYclovir (VALTREX) 500 MG tablet Take 1 tablet (500 mg total) by mouth 2 (two) times daily. For 3-5 days for outbreak 02/22/16   Panosh, Neta Mends, MD  vitamin C (ASCORBIC ACID) 500 MG tablet Take 500 mg by mouth daily.    [provider]  VITAMIN E COMPLEX PO Take 1 tablet by mouth daily.    [provider]      Allergies    Norco [hydrocodone-acetaminophen] and Oxycodone-acetaminophen    Review of Systems   Review of Systems  Constitutional:  Negative for chills and fever.  Respiratory:  Negative for shortness of breath.   Cardiovascular:  Negative for chest pain.  Gastrointestinal:  Positive for abdominal pain and nausea. Negative for constipation and vomiting.  Genitourinary:  Positive for frequency. Negative for difficulty urinating.  Musculoskeletal:  Negative for back pain.  All other systems reviewed and are negative.   Physical Exam Updated Vital Signs BP 137/68 (BP Location: Left Arm)   Pulse 80   Temp 98.3 F (36.8 C) (Oral)   Resp 18   Ht 5' 1.5" (1.562 m)   Wt 64.4 kg   LMP 07/24/2022 (  Exact Date)   SpO2 100%   BMI 26.40 kg/m  Physical Exam Vitals and nursing note reviewed.  Constitutional:      Appearance: She is well-developed.  HENT:     Head: Normocephalic and atraumatic.  Cardiovascular:     Rate and Rhythm: Normal rate.  Pulmonary:     Effort: Pulmonary effort is normal.     Breath sounds: No wheezing or rales.  Abdominal:     General: Abdomen is flat. Bowel sounds are increased.     Tenderness: There is no abdominal tenderness. There is no right CVA tenderness or left CVA tenderness.  Skin:    General: Skin is warm and dry.  Neurological:     Mental Status: She is alert.     ED Results / Procedures / Treatments   Labs (all labs  ordered are listed, but only abnormal results are displayed) Labs Reviewed  COMPREHENSIVE METABOLIC PANEL - Abnormal; Notable for the following components:      Result Value   Sodium 133 (*)    Calcium 8.5 (*)    All other components within normal limits  CBC - Abnormal; Notable for the following components:   RBC 5.52 (*)    MCV 71.0 (*)    MCH 21.7 (*)    RDW 15.7 (*)    All other components within normal limits  LIPASE, BLOOD  URINALYSIS, ROUTINE W REFLEX MICROSCOPIC  PREGNANCY, URINE    EKG None  Radiology No results found.  Procedures Procedures    Medications Ordered in ED Medications  naproxen (NAPROSYN) tablet 500 mg (500 mg Oral Given 08/10/22 1403)    ED Course/ Medical Decision Making/ A&P                             Medical Decision Making Amount and/or Complexity of Data Reviewed Labs: ordered.  Risk Prescription drug management.   This patient presents to the ED for concern of abdominal pain, this involves a number of treatment options, and is a complaint that carries with it a high risk of complications and morbidity.  The differential diagnosis includes ectopic pregnancy, ovarian cyst, renal colic versus viral enteritis.   Co morbidities: Discussed in HPI   Brief History:  She here with sudden onset of abdominal pain which began around 1 AM, reports improvement in her symptoms now.  She is concerned that this may be a recurrent ovarian cyst, she called her gynecologist who recommended she be evaluated in the emergency department.  EMR reviewed including pt PMHx, past surgical history and past visits to ER.   See HPI for more details   Lab Tests:  I ordered and independently interpreted labs.  The pertinent results include:    I personally reviewed all laboratory work and imaging. Metabolic panel without any acute abnormality specifically kidney function within normal limits and no significant electrolyte abnormalities. CBC without  leukocytosis or significant anemia.  Pregnancy test is negative.  Lipase level is normal.  UA without any signs of infection.   Imaging Studies:  No imaging studies ordered for this patient  Medicines ordered:  I ordered medication including naproxen  for pain control Reevaluation of the patient after these medicines showed that the patient improved I have reviewed the patients home medicines and have made adjustments as needed  Reevaluation:  After the interventions noted above I re-evaluated patient and found that they have :improved   Social Determinants of Health:  The patient's social determinants of health were a factor in the care of this patient   Problem List / ED Course:  Patient presents to the ED with chief complaint of lower abdominal pain that began this morning around 1 AM, this was severe in appreciating nature approximately lasted 30 to 45 minutes.  Today she reports her abdomen feels somewhat sore, she came straight from work here.  Did take some ibuprofen which helped her passing gas.  Regular bowel movements at home, no nausea or vomiting.  On evaluation she appears comfortable, vitals are within normal limits without any tachycardia, she is afebrile.  She reports some urinary frequency, the symptoms have been ongoing for the last 24 hours.  Labs on today's visit with a CBC is unremarkable, CMP with no electrolyte derangement.  Current levels unremarkable.  LFTs are within normal limits.  Pregnancy test is negative here, she has no prior history of blood tubal ligation.  Lipase level is normal.  UA without any signs of infection.  She was given naproxen here with benign serial abdominal exams, there is improvement in her symptoms after relief.  She otherwise appears in stable condition, she does feel that this might have been an ovarian cyst, I did discuss with her without any vaginal discharge, vaginal bleeding, low suspicion for pelvic etiology.  She is not particularly  tender along [place], no upper abdominal pain, no nausea or vomiting. I feel patient is appropriate for discharge with close follow-up.  Patient is hemodynamically stable for discharge.  Dispostion:  After consideration of the diagnostic results and the patients response to treatment, I feel that the patent would benefit from outpatient follow up with PCP.     Portions of this note were generated with Scientist, clinical (histocompatibility and immunogenetics)Dragon dictation software. Dictation errors may occur despite best attempts at proofreading.   Final Clinical Impression(s) / ED Diagnoses Final diagnoses:  Lower abdominal pain    Rx / DC Orders ED Discharge Orders          Ordered    naproxen (NAPROSYN) 375 MG tablet  2 times daily        08/10/22 1449              Claude MangesSoto, Concha Sudol, PA-C 08/10/22 1501    Melene PlanFloyd, Dan, DO 08/11/22 0703

## 2022-08-10 NOTE — Discharge Instructions (Addendum)
Your laboratory results are within normal limits today.  Your urinalysis did not show any signs of infection.  You were given a short course of anti-inflammatories in order to help with your pain, please take 1 tablet twice a day for the next 7 days.  You experience any worsening symptoms, fever, vomiting please return to the emergency department.

## 2022-08-13 ENCOUNTER — Inpatient Hospital Stay: Payer: BC Managed Care – PPO | Attending: Oncology

## 2022-08-13 DIAGNOSIS — N920 Excessive and frequent menstruation with regular cycle: Secondary | ICD-10-CM | POA: Diagnosis not present

## 2022-08-13 DIAGNOSIS — D500 Iron deficiency anemia secondary to blood loss (chronic): Secondary | ICD-10-CM | POA: Diagnosis not present

## 2022-08-13 LAB — CBC WITH DIFFERENTIAL/PLATELET
Abs Immature Granulocytes: 0.01 10*3/uL (ref 0.00–0.07)
Basophils Absolute: 0 10*3/uL (ref 0.0–0.1)
Basophils Relative: 1 %
Eosinophils Absolute: 0 10*3/uL (ref 0.0–0.5)
Eosinophils Relative: 1 %
HCT: 37.7 % (ref 36.0–46.0)
Hemoglobin: 11.6 g/dL — ABNORMAL LOW (ref 12.0–15.0)
Immature Granulocytes: 0 %
Lymphocytes Relative: 34 %
Lymphs Abs: 1.4 10*3/uL (ref 0.7–4.0)
MCH: 21.8 pg — ABNORMAL LOW (ref 26.0–34.0)
MCHC: 30.8 g/dL (ref 30.0–36.0)
MCV: 71 fL — ABNORMAL LOW (ref 80.0–100.0)
Monocytes Absolute: 0.5 10*3/uL (ref 0.1–1.0)
Monocytes Relative: 11 %
Neutro Abs: 2.2 10*3/uL (ref 1.7–7.7)
Neutrophils Relative %: 53 %
Platelets: 184 10*3/uL (ref 150–400)
RBC: 5.31 MIL/uL — ABNORMAL HIGH (ref 3.87–5.11)
RDW: 15.7 % — ABNORMAL HIGH (ref 11.5–15.5)
WBC: 4.1 10*3/uL (ref 4.0–10.5)
nRBC: 0 % (ref 0.0–0.2)

## 2022-08-13 LAB — FERRITIN: Ferritin: 23 ng/mL (ref 11–307)

## 2022-08-14 ENCOUNTER — Telehealth: Payer: Self-pay

## 2022-08-14 ENCOUNTER — Telehealth: Payer: Self-pay | Admitting: Oncology

## 2022-08-14 NOTE — Telephone Encounter (Signed)
-----   Message from Rana Snare, NP sent at 08/13/2022  1:16 PM EDT ----- Please let her know the hemoglobin and ferritin are drifting down.  Recommend IV iron at her next visit in 4 weeks.  If she agrees please request an appointment for this after the office visit.

## 2022-08-14 NOTE — Telephone Encounter (Signed)
The patient has agreed to come in for an IV treatment with Feraheme. I have informed the scheduler to arrange a suitable appointment for the patient within the next week.

## 2022-08-14 NOTE — Telephone Encounter (Signed)
Contacted patient to scheduled appointments. Patient is aware of appointments that are scheduled.   

## 2022-08-16 ENCOUNTER — Inpatient Hospital Stay: Payer: BC Managed Care – PPO

## 2022-08-16 ENCOUNTER — Other Ambulatory Visit: Payer: Self-pay | Admitting: Nurse Practitioner

## 2022-08-16 VITALS — BP 120/82 | HR 74 | Temp 98.5°F | Resp 18 | Ht 61.5 in | Wt 145.2 lb

## 2022-08-16 DIAGNOSIS — D500 Iron deficiency anemia secondary to blood loss (chronic): Secondary | ICD-10-CM | POA: Diagnosis not present

## 2022-08-16 DIAGNOSIS — N920 Excessive and frequent menstruation with regular cycle: Secondary | ICD-10-CM | POA: Diagnosis not present

## 2022-08-16 MED ORDER — SODIUM CHLORIDE 0.9 % IV SOLN
510.0000 mg | Freq: Once | INTRAVENOUS | Status: AC
  Administered 2022-08-16: 510 mg via INTRAVENOUS
  Filled 2022-08-16: qty 510

## 2022-08-16 MED ORDER — SODIUM CHLORIDE 0.9 % IV SOLN: Freq: Once | INTRAVENOUS | Status: AC

## 2022-08-16 NOTE — Patient Instructions (Signed)

## 2022-08-24 ENCOUNTER — Inpatient Hospital Stay: Payer: BC Managed Care – PPO

## 2022-08-24 NOTE — Progress Notes (Signed)
Patient in infusion room for feraheme infusion today. Peripheral IV attempted x2 without success. Patient stated she did not want to have another IV attempted and wanted to cancel today's infusion. Patient did not want to reschedule today's infusion and will plan on coming to her next follow-up appt on 09/10/22. Lonna Cobb, NP made aware. No new orders at this time.

## 2022-08-24 NOTE — Patient Instructions (Signed)

## 2022-09-10 ENCOUNTER — Inpatient Hospital Stay: Payer: BC Managed Care – PPO | Admitting: Oncology

## 2022-09-10 ENCOUNTER — Inpatient Hospital Stay: Payer: BC Managed Care – PPO | Attending: Oncology

## 2022-09-10 VITALS — BP 124/77 | HR 79 | Temp 98.1°F | Resp 18 | Ht 61.5 in | Wt 144.7 lb

## 2022-09-10 DIAGNOSIS — Z86711 Personal history of pulmonary embolism: Secondary | ICD-10-CM | POA: Insufficient documentation

## 2022-09-10 DIAGNOSIS — D259 Leiomyoma of uterus, unspecified: Secondary | ICD-10-CM | POA: Insufficient documentation

## 2022-09-10 DIAGNOSIS — E282 Polycystic ovarian syndrome: Secondary | ICD-10-CM | POA: Insufficient documentation

## 2022-09-10 DIAGNOSIS — N920 Excessive and frequent menstruation with regular cycle: Secondary | ICD-10-CM | POA: Diagnosis not present

## 2022-09-10 DIAGNOSIS — D500 Iron deficiency anemia secondary to blood loss (chronic): Secondary | ICD-10-CM | POA: Diagnosis not present

## 2022-09-10 LAB — CBC WITH DIFFERENTIAL/PLATELET
Abs Immature Granulocytes: 0.02 10*3/uL (ref 0.00–0.07)
Basophils Absolute: 0 10*3/uL (ref 0.0–0.1)
Basophils Relative: 1 %
Eosinophils Absolute: 0.1 10*3/uL (ref 0.0–0.5)
Eosinophils Relative: 2 %
HCT: 37.8 % (ref 36.0–46.0)
Hemoglobin: 11.5 g/dL — ABNORMAL LOW (ref 12.0–15.0)
Immature Granulocytes: 1 %
Lymphocytes Relative: 29 %
Lymphs Abs: 1.1 10*3/uL (ref 0.7–4.0)
MCH: 22.3 pg — ABNORMAL LOW (ref 26.0–34.0)
MCHC: 30.4 g/dL (ref 30.0–36.0)
MCV: 73.4 fL — ABNORMAL LOW (ref 80.0–100.0)
Monocytes Absolute: 0.5 10*3/uL (ref 0.1–1.0)
Monocytes Relative: 13 %
Neutro Abs: 2.2 10*3/uL (ref 1.7–7.7)
Neutrophils Relative %: 54 %
Platelets: 208 10*3/uL (ref 150–400)
RBC: 5.15 MIL/uL — ABNORMAL HIGH (ref 3.87–5.11)
RDW: 15.6 % — ABNORMAL HIGH (ref 11.5–15.5)
WBC: 3.9 10*3/uL — ABNORMAL LOW (ref 4.0–10.5)
nRBC: 0 % (ref 0.0–0.2)

## 2022-09-10 LAB — FERRITIN: Ferritin: 105 ng/mL (ref 11–307)

## 2022-09-10 NOTE — Progress Notes (Signed)
  [facility] OFFICE PROGRESS NOTE   Diagnosis: Iron deficiency anemia  INTERVAL HISTORY:   Ms Mangine as scheduled.  She reports menorrhagia proved after the uterine artery embolization in September 2023.  The menstrual cycle has returned over the past 2 months and is now heavy.  She does not have symptoms of iron deficiency at present.  She was treated with 1 dose of IV iron on 08/16/2022.  The dose of IV iron was aborted on 08/24/2022 due to unsuccessful IV attempts.  Objective:  Vital signs in last 24 hours:  Blood pressure 124/77, pulse 79, temperature 98.1 F (36.7 C), temperature source Oral, resp. rate 18, height 5' 1.5" (1.562 m), weight 144 lb 11.2 oz (65.6 kg), SpO2 100 %.    Resp: Lungs clear bilaterally Cardio: Regular rate and rhythm GI: No hepatosplenomegaly, nontender Vascular: No leg edema   Lab Results:  Lab Results  Component Value Date   WBC 3.9 (L) 09/10/2022   HGB 11.5 (L) 09/10/2022   HCT 37.8 09/10/2022   MCV 73.4 (L) 09/10/2022   PLT 208 09/10/2022   NEUTROABS 2.2 09/10/2022    CMP  Lab Results  Component Value Date   NA 133 (L) 08/10/2022   K 3.8 08/10/2022   CL 105 08/10/2022   CO2 23 08/10/2022   GLUCOSE 70 08/10/2022   BUN 9 08/10/2022   CREATININE 0.83 08/10/2022   CALCIUM 8.5 (L) 08/10/2022   PROT 7.2 08/10/2022   ALBUMIN 4.2 08/10/2022   AST 16 08/10/2022   ALT 18 08/10/2022   ALKPHOS 42 08/10/2022   BILITOT 0.5 08/10/2022   GFRNONAA >60 08/10/2022   GFRAA >60 07/16/2017     Medications: I have reviewed the patient's current medications.   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 Hanke has a history of iron deficiency anemia.  The ferritin is now in the normal range and the hemoglobin is mildly low.  The MCV is higher but remains low.  She likely has a component of ongoing iron deficiency, though she could have a thalassemia variant.  I recommended she increase the oral iron replacement to daily.  She will return for an office visit and CBC in 3 months.  She will call for symptoms of anemia.  Thornton Papas, MD  09/10/2022  10:35 AM

## 2022-09-24 ENCOUNTER — Other Ambulatory Visit: Payer: Self-pay | Admitting: Interventional Radiology

## 2022-09-24 DIAGNOSIS — R1909 Other intra-abdominal and pelvic swelling, mass and lump: Secondary | ICD-10-CM

## 2022-09-28 DIAGNOSIS — F4310 Post-traumatic stress disorder, unspecified: Secondary | ICD-10-CM | POA: Diagnosis not present

## 2022-10-04 DIAGNOSIS — R300 Dysuria: Secondary | ICD-10-CM | POA: Diagnosis not present

## 2022-10-04 DIAGNOSIS — R350 Frequency of micturition: Secondary | ICD-10-CM | POA: Diagnosis not present

## 2022-10-29 ENCOUNTER — Ambulatory Visit
Admission: RE | Admit: 2022-10-29 | Discharge: 2022-10-29 | Disposition: A | Discharge status: Home / Self Care | Payer: BC Managed Care – PPO | Source: Ambulatory Visit | Attending: Interventional Radiology | Admitting: Interventional Radiology

## 2022-10-29 DIAGNOSIS — R1909 Other intra-abdominal and pelvic swelling, mass and lump: Secondary | ICD-10-CM

## 2022-10-29 DIAGNOSIS — M79651 Pain in right thigh: Secondary | ICD-10-CM | POA: Diagnosis not present

## 2022-12-11 ENCOUNTER — Inpatient Hospital Stay: Payer: BC Managed Care – PPO

## 2022-12-11 ENCOUNTER — Inpatient Hospital Stay: Payer: BC Managed Care – PPO | Admitting: Oncology

## 2022-12-11 DIAGNOSIS — Z01419 Encounter for gynecological examination (general) (routine) without abnormal findings: Secondary | ICD-10-CM | POA: Diagnosis not present

## 2022-12-11 DIAGNOSIS — Z114 Encounter for screening for human immunodeficiency virus [HIV]: Secondary | ICD-10-CM | POA: Diagnosis not present

## 2022-12-11 DIAGNOSIS — Z1159 Encounter for screening for other viral diseases: Secondary | ICD-10-CM | POA: Diagnosis not present

## 2022-12-26 DIAGNOSIS — D251 Intramural leiomyoma of uterus: Secondary | ICD-10-CM | POA: Diagnosis not present

## 2022-12-26 DIAGNOSIS — N921 Excessive and frequent menstruation with irregular cycle: Secondary | ICD-10-CM | POA: Diagnosis not present

## 2022-12-27 DIAGNOSIS — N6315 Unspecified lump in the right breast, overlapping quadrants: Secondary | ICD-10-CM | POA: Diagnosis not present

## 2022-12-27 DIAGNOSIS — N6489 Other specified disorders of breast: Secondary | ICD-10-CM | POA: Diagnosis not present

## 2022-12-27 DIAGNOSIS — N63 Unspecified lump in unspecified breast: Secondary | ICD-10-CM | POA: Diagnosis not present

## 2022-12-31 DIAGNOSIS — R922 Inconclusive mammogram: Secondary | ICD-10-CM | POA: Diagnosis not present

## 2023-01-02 DIAGNOSIS — N921 Excessive and frequent menstruation with irregular cycle: Secondary | ICD-10-CM | POA: Diagnosis not present

## 2023-01-11 DIAGNOSIS — F4310 Post-traumatic stress disorder, unspecified: Secondary | ICD-10-CM | POA: Diagnosis not present

## 2023-01-11 DIAGNOSIS — F4323 Adjustment disorder with mixed anxiety and depressed mood: Secondary | ICD-10-CM | POA: Diagnosis not present

## 2023-01-15 ENCOUNTER — Inpatient Hospital Stay: Payer: BC Managed Care – PPO | Attending: Oncology

## 2023-01-15 ENCOUNTER — Inpatient Hospital Stay: Payer: BC Managed Care – PPO | Admitting: Oncology

## 2023-01-15 ENCOUNTER — Other Ambulatory Visit: Payer: Self-pay | Admitting: *Deleted

## 2023-01-15 VITALS — BP 137/85 | HR 80 | Temp 98.1°F | Resp 18 | Ht 61.5 in | Wt 147.0 lb

## 2023-01-15 DIAGNOSIS — D259 Leiomyoma of uterus, unspecified: Secondary | ICD-10-CM | POA: Insufficient documentation

## 2023-01-15 DIAGNOSIS — Z86711 Personal history of pulmonary embolism: Secondary | ICD-10-CM | POA: Insufficient documentation

## 2023-01-15 DIAGNOSIS — E282 Polycystic ovarian syndrome: Secondary | ICD-10-CM | POA: Diagnosis not present

## 2023-01-15 DIAGNOSIS — N920 Excessive and frequent menstruation with regular cycle: Secondary | ICD-10-CM | POA: Insufficient documentation

## 2023-01-15 DIAGNOSIS — D500 Iron deficiency anemia secondary to blood loss (chronic): Secondary | ICD-10-CM | POA: Insufficient documentation

## 2023-01-15 LAB — CBC WITH DIFFERENTIAL (CANCER CENTER ONLY)
Abs Immature Granulocytes: 0.01 10*3/uL (ref 0.00–0.07)
Basophils Absolute: 0 10*3/uL (ref 0.0–0.1)
Basophils Relative: 1 %
Eosinophils Absolute: 0.1 10*3/uL (ref 0.0–0.5)
Eosinophils Relative: 2 %
HCT: 38.6 % (ref 36.0–46.0)
Hemoglobin: 11.8 g/dL — ABNORMAL LOW (ref 12.0–15.0)
Immature Granulocytes: 0 %
Lymphocytes Relative: 36 %
Lymphs Abs: 1.2 10*3/uL (ref 0.7–4.0)
MCH: 21.9 pg — ABNORMAL LOW (ref 26.0–34.0)
MCHC: 30.6 g/dL (ref 30.0–36.0)
MCV: 71.7 fL — ABNORMAL LOW (ref 80.0–100.0)
Monocytes Absolute: 0.2 10*3/uL (ref 0.1–1.0)
Monocytes Relative: 7 %
Neutro Abs: 1.7 10*3/uL (ref 1.7–7.7)
Neutrophils Relative %: 54 %
Platelet Count: 235 10*3/uL (ref 150–400)
RBC: 5.38 MIL/uL — ABNORMAL HIGH (ref 3.87–5.11)
RDW: 14.8 % (ref 11.5–15.5)
WBC Count: 3.3 10*3/uL — ABNORMAL LOW (ref 4.0–10.5)
nRBC: 0 % (ref 0.0–0.2)

## 2023-01-15 LAB — FERRITIN: Ferritin: 24 ng/mL (ref 11–307)

## 2023-01-15 NOTE — Progress Notes (Signed)
  [facility] OFFICE PROGRESS NOTE   Diagnosis: Iron deficiency anemia  INTERVAL HISTORY:   [name] returns as scheduled.  She continues to have a monthly menstrual cycle.  She is taking iron approximate twice weekly.  No other bleeding.  She reports her brother was recently diagnosed with beta thalassemia. [name] is being treated for a "cyst "in the right axilla.  She reports a palpable firm area in the right axilla.  Objective:  Vital signs in last 24 hours:  Blood pressure 137/85, pulse 80, temperature 98.1 F (36.7 C), temperature source Oral, resp. rate 18, height 5' 1.5" (1.562 m), weight 147 lb (66.7 kg), SpO2 100%.    Lymphatics: No right axillary nodes Resp: Lungs clear bilaterally Cardio: Regular rate and rhythm GI: No hepatosplenomegaly Vascular: No leg edema  Skin: Area of superficial subcutaneous firmness/induration in the high right axilla without erythema or drainage   Lab Results:  Lab Results  Component Value Date   WBC 3.3 (L) 01/15/2023   HGB 11.8 (L) 01/15/2023   HCT 38.6 01/15/2023   MCV 71.7 (L) 01/15/2023   PLT 235 01/15/2023   NEUTROABS 1.7 01/15/2023    CMP  Lab Results  Component Value Date   NA 133 (L) 08/10/2022   K 3.8 08/10/2022   CL 105 08/10/2022   CO2 23 08/10/2022   GLUCOSE 70 08/10/2022   BUN 9 08/10/2022   CREATININE 0.83 08/10/2022   CALCIUM 8.5 (L) 08/10/2022   PROT 7.2 08/10/2022   ALBUMIN 4.2 08/10/2022   AST 16 08/10/2022   ALT 18 08/10/2022   ALKPHOS 42 08/10/2022   BILITOT 0.5 08/10/2022   GFRNONAA >60 08/10/2022   GFRAA >60 07/16/2017     Medications: I have reviewed the patient's current medications.   Assessment/Plan: Iron deficiency anemia Feraheme 510 mg IV 04/16/2022 and 04/23/2022, 08/16/2022 Menorrhagia secondary to uterine fibroids History of pulmonary embolus 12/11/2015, occurred after an abdominal myomectomy to resect uterine fibroids on 11/29/2015, completed Xarelto x3  months PCOS History of mild leukopenia, likely benign normal variant Status post left uterine artery embolization 01/29/2022     Disposition: Jessica Dudley has a history of iron deficiency anemia.  The ferritin is in the low normal range.  The hemoglobin and MCV are mildly low.  She reports her brother was recently diagnosed with beta thalassemia.  The clinical presentation is consistent with iron deficiency anemia which has improved with iron therapy.  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  We will check hemoglobin electrophoresis today.  We will add alpha and beta globin gene testing as indicated at the next office visit.  She will return for an office and lab visit in 4 months.  She will continue follow-up with gynecology for management of the apparent right axillary hidradenitis.  Thornton Papas, MD  01/15/2023  9:53 AM

## 2023-01-17 LAB — HGB FRACTIONATION CASCADE
Hgb A2: 2.9 % (ref 1.8–3.2)
Hgb A: 97.1 % (ref 96.4–98.8)
Hgb F: 0 % (ref 0.0–2.0)
Hgb S: 0 %

## 2023-01-22 ENCOUNTER — Telehealth: Payer: Self-pay | Admitting: *Deleted

## 2023-01-22 DIAGNOSIS — D500 Iron deficiency anemia secondary to blood loss (chronic): Secondary | ICD-10-CM

## 2023-01-22 NOTE — Telephone Encounter (Signed)
-----   Message from Thornton Papas sent at 01/18/2023  3:40 PM EDT ----- Please call patient, the hemoglobin electrophoresis is normal, does not suggest beta thalassemia, alpha thalassemia remains a possibility.  I recommend she continue iron.  I recommend alpha globin gene testing, follow-up as scheduled

## 2023-01-22 NOTE — Telephone Encounter (Signed)
Notified patient of normal hemoglobin electrophoresis, that suggests she does not have beta thalassemia, but alpha thalassemia remains possible. He recommends alpha globin gene testing, also called Alpha Thalassemia GenotypeR testing to look for any gene deletions that can help diagnose. She agrees and wishes to wait till January appointment to see if insurance will cover this test.  Informed her nurse will send her Mychart message with test name to research>

## 2023-01-25 DIAGNOSIS — F411 Generalized anxiety disorder: Secondary | ICD-10-CM | POA: Diagnosis not present

## 2023-01-25 DIAGNOSIS — F331 Major depressive disorder, recurrent, moderate: Secondary | ICD-10-CM | POA: Diagnosis not present

## 2023-01-28 DIAGNOSIS — N6315 Unspecified lump in the right breast, overlapping quadrants: Secondary | ICD-10-CM | POA: Diagnosis not present

## 2023-01-28 DIAGNOSIS — F331 Major depressive disorder, recurrent, moderate: Secondary | ICD-10-CM | POA: Diagnosis not present

## 2023-01-28 DIAGNOSIS — F411 Generalized anxiety disorder: Secondary | ICD-10-CM | POA: Diagnosis not present

## 2023-01-31 ENCOUNTER — Other Ambulatory Visit: Payer: Self-pay

## 2023-01-31 DIAGNOSIS — N61 Mastitis without abscess: Secondary | ICD-10-CM | POA: Diagnosis not present

## 2023-01-31 DIAGNOSIS — D241 Benign neoplasm of right breast: Secondary | ICD-10-CM | POA: Diagnosis not present

## 2023-02-01 LAB — SURGICAL PATHOLOGY

## 2023-02-11 DIAGNOSIS — F411 Generalized anxiety disorder: Secondary | ICD-10-CM | POA: Diagnosis not present

## 2023-02-11 DIAGNOSIS — F331 Major depressive disorder, recurrent, moderate: Secondary | ICD-10-CM | POA: Diagnosis not present

## 2023-02-28 DIAGNOSIS — F411 Generalized anxiety disorder: Secondary | ICD-10-CM | POA: Diagnosis not present

## 2023-02-28 DIAGNOSIS — F331 Major depressive disorder, recurrent, moderate: Secondary | ICD-10-CM | POA: Diagnosis not present

## 2023-05-20 ENCOUNTER — Other Ambulatory Visit: Payer: BC Managed Care – PPO

## 2023-05-20 ENCOUNTER — Inpatient Hospital Stay: Payer: BC Managed Care – PPO | Attending: Oncology | Admitting: Oncology

## 2023-05-20 ENCOUNTER — Inpatient Hospital Stay: Payer: BC Managed Care – PPO

## 2023-05-20 VITALS — BP 117/80 | HR 74 | Temp 98.2°F | Resp 18 | Ht 61.5 in | Wt 142.9 lb

## 2023-05-20 DIAGNOSIS — D259 Leiomyoma of uterus, unspecified: Secondary | ICD-10-CM | POA: Insufficient documentation

## 2023-05-20 DIAGNOSIS — D500 Iron deficiency anemia secondary to blood loss (chronic): Secondary | ICD-10-CM

## 2023-05-20 DIAGNOSIS — D509 Iron deficiency anemia, unspecified: Secondary | ICD-10-CM | POA: Diagnosis not present

## 2023-05-20 DIAGNOSIS — N920 Excessive and frequent menstruation with regular cycle: Secondary | ICD-10-CM | POA: Insufficient documentation

## 2023-05-20 LAB — CBC WITH DIFFERENTIAL (CANCER CENTER ONLY)
Abs Immature Granulocytes: 0.01 10*3/uL (ref 0.00–0.07)
Basophils Absolute: 0.1 10*3/uL (ref 0.0–0.1)
Basophils Relative: 1 %
Eosinophils Absolute: 0 10*3/uL (ref 0.0–0.5)
Eosinophils Relative: 1 %
HCT: 37.6 % (ref 36.0–46.0)
Hemoglobin: 11.7 g/dL — ABNORMAL LOW (ref 12.0–15.0)
Immature Granulocytes: 0 %
Lymphocytes Relative: 38 %
Lymphs Abs: 1.4 10*3/uL (ref 0.7–4.0)
MCH: 21.7 pg — ABNORMAL LOW (ref 26.0–34.0)
MCHC: 31.1 g/dL (ref 30.0–36.0)
MCV: 69.9 fL — ABNORMAL LOW (ref 80.0–100.0)
Monocytes Absolute: 0.4 10*3/uL (ref 0.1–1.0)
Monocytes Relative: 11 %
Neutro Abs: 1.8 10*3/uL (ref 1.7–7.7)
Neutrophils Relative %: 49 %
Platelet Count: 242 10*3/uL (ref 150–400)
RBC: 5.38 MIL/uL — ABNORMAL HIGH (ref 3.87–5.11)
RDW: 14.5 % (ref 11.5–15.5)
WBC Count: 3.8 10*3/uL — ABNORMAL LOW (ref 4.0–10.5)
nRBC: 0 % (ref 0.0–0.2)

## 2023-05-20 LAB — FERRITIN: Ferritin: 13 ng/mL (ref 11–307)

## 2023-05-20 NOTE — Progress Notes (Signed)
  [facility] OFFICE PROGRESS NOTE   Diagnosis: Iron  deficiency anemia  INTERVAL HISTORY:   Jessica Dudley returns as scheduled.  She generally feels well.  She has a 7-day menstrual cycle each month.  She has a headache over the last few days of each cycle.  She is not taking iron  consistently.  She takes iron  approximately once per week.  No other bleeding.  Iron  causes constipation.  Objective:  Vital signs in last 24 hours:  Blood pressure 117/80, pulse 74, temperature 98.2 F (36.8 C), temperature source Temporal, resp. rate 18, height 5' 1.5 (1.562 m), weight 142 lb 14.4 oz (64.8 kg), SpO2 100%.    Resp: Lungs clear bilaterally Cardio: Regular rate and rhythm GI: No hepatosplenomegaly, fibroid uterus palpable in the suprapubic area Vascular: No leg edema  Lab Results:  Lab Results  Component Value Date   WBC 3.8 (L) 05/20/2023   HGB 11.7 (L) 05/20/2023   HCT 37.6 05/20/2023   MCV 69.9 (L) 05/20/2023   PLT 242 05/20/2023   NEUTROABS 1.8 05/20/2023    CMP  Lab Results  Component Value Date   NA 133 (L) 08/10/2022   K 3.8 08/10/2022   CL 105 08/10/2022   CO2 23 08/10/2022   GLUCOSE 70 08/10/2022   BUN 9 08/10/2022   CREATININE 0.83 08/10/2022   CALCIUM 8.5 (L) 08/10/2022   PROT 7.2 08/10/2022   ALBUMIN 4.2 08/10/2022   AST 16 08/10/2022   ALT 18 08/10/2022   ALKPHOS 42 08/10/2022   BILITOT 0.5 08/10/2022   GFRNONAA >60 08/10/2022   GFRAA >60 07/16/2017     Medications: I have reviewed the patient's current medications.   Assessment/Plan: Iron  deficiency anemia Feraheme  510 mg IV 04/16/2022 and 04/23/2022, 08/16/2022 Menorrhagia secondary to uterine fibroids History of pulmonary embolus 12/11/2015, occurred after an abdominal myomectomy to resect uterine fibroids on 11/29/2015, completed Xarelto  x3 months PCOS History of mild leukopenia, likely benign normal variant Status post left uterine artery embolization  01/29/2022      Disposition: Jessica Dudley has a history of iron  deficiency anemia.  The hemoglobin is mildly decreased and the ferritin is in the low end of the normal range.  I recommended she increase the ferrous sulfate  to daily.  She will try stool softener and MiraLAX  for constipation.  She will call us  for persistent constipation.  She will follow-up with Dr. Rutherford for the persistent menorrhagia.  She will discuss uterine ablation with Dr. Rutherford, though she may not be a candidate with the fibroids.  She will return for an office and lab visit in 6 months.  She will call for symptoms of anemia. We obtained thalassemia genetic testing today.  Arley Hof, MD  05/20/2023  12:49 PM

## 2023-05-30 ENCOUNTER — Telehealth: Payer: Self-pay | Admitting: *Deleted

## 2023-05-30 NOTE — Telephone Encounter (Signed)
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

## 2023-05-30 NOTE — Telephone Encounter (Signed)
Informed Jessica Dudley that per Dr. 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

## 2023-05-31 LAB — ALPHA-THALASSEMIA GENOTYPR

## 2023-06-08 DIAGNOSIS — F4321 Adjustment disorder with depressed mood: Secondary | ICD-10-CM | POA: Diagnosis not present

## 2023-06-11 DIAGNOSIS — Z1231 Encounter for screening mammogram for malignant neoplasm of breast: Secondary | ICD-10-CM | POA: Diagnosis not present

## 2023-06-20 DIAGNOSIS — F4321 Adjustment disorder with depressed mood: Secondary | ICD-10-CM | POA: Diagnosis not present

## 2023-07-04 DIAGNOSIS — F4321 Adjustment disorder with depressed mood: Secondary | ICD-10-CM | POA: Diagnosis not present

## 2023-07-18 DIAGNOSIS — F4321 Adjustment disorder with depressed mood: Secondary | ICD-10-CM | POA: Diagnosis not present

## 2023-07-29 DIAGNOSIS — F4321 Adjustment disorder with depressed mood: Secondary | ICD-10-CM | POA: Diagnosis not present

## 2023-08-07 ENCOUNTER — Ambulatory Visit: Payer: Self-pay

## 2023-08-07 NOTE — Telephone Encounter (Signed)
  Chief Complaint: Upper respiratory illness Symptoms: cough, sore throat Frequency: constant Pertinent Negatives: Patient denies fever, difficulty breathing Disposition: [] ED /[] Urgent Care (no appt availability in office) / [x] Appointment(In office/virtual)/ []  [place] [facility]/ [] Home Care/ [] Refused Recommended Disposition /[] [place] [facility]/ []  Follow-up with PCP Additional Notes:  Symptoms started one week ago of scratchy throat. Then 3 days ago developed cough that is persistently worsening. Unable to stop coughing while at work today and sent home. She reports that her throat and roof of mouth is sore. She states she has chest discomfort from coughing so intense. Acute visit scheduled with an alternate provider. Educated on care advice as documented in protocol, patient verbalized understanding.    Copied from CRM [id number]. Topic: Clinical - Red Word Triage >> Aug 07, 2023  5:40 PM Cammy Copa D wrote: Red Word that prompted transfer to Nurse Triage: PT started off with a scratchy throat, thought it was allergies, experiencing really bad cough, chest is hurting due to coughing - irritated throat, spitting up mucus, mucus is mostly clear Reason for Disposition  SEVERE coughing spells (e.g., whooping sound after coughing, vomiting after coughing)  Protocols used: Cough - Acute Productive-A-AH

## 2023-08-08 ENCOUNTER — Ambulatory Visit (INDEPENDENT_AMBULATORY_CARE_PROVIDER_SITE_OTHER): Admitting: Family Medicine

## 2023-08-08 ENCOUNTER — Encounter: Payer: Self-pay | Admitting: Family Medicine

## 2023-08-08 VITALS — BP 124/80 | HR 74 | Temp 98.6°F | Ht 61.5 in | Wt 140.6 lb

## 2023-08-08 DIAGNOSIS — J0140 Acute pansinusitis, unspecified: Secondary | ICD-10-CM

## 2023-08-08 DIAGNOSIS — J301 Allergic rhinitis due to pollen: Secondary | ICD-10-CM | POA: Diagnosis not present

## 2023-08-08 MED ORDER — AMOXICILLIN-POT CLAVULANATE 500-125 MG PO TABS: 1.0000 | ORAL_TABLET | Freq: Two times a day (BID) | ORAL | 0 refills | Status: AC

## 2023-08-08 NOTE — Progress Notes (Signed)
 Established Patient Office Visit   Subjective  Patient ID: Jessica Dudley, female    DOB: [birth year]  Age: 48 y.o. MRN: [id number]  Chief Complaint  Patient presents with   Cough    Cough, congestion, sore throat, started 2 weeks ago     Patient is a 48 year old female followed by Dr. Fabian Sharp and seen for acute concern.  Patient endorses facial pressure, scratchy throat, nasal drainage, rhinorrhea, ear pain/pressure, cough worse at night x 2 weeks.  Facial pressure has improved this week.  Patient tried humidifier at night, Mucinex.  Denies fever, chills, headache, nausea, vomiting, loose stools.  Restarted OTC allergy med either Claritin or Zyrtec.    Patient Active Problem List   Diagnosis Date Noted   Fibroid 01/29/2022   Fibroids 01/29/2022   Cesarean delivery delivered 4/18 08/22/2017   Postpartum care following cesarean delivery (4/18) 08/22/2017   Hx of pulmonary embolus 07/23/2017   In vitro fertilization 07/02/2016   PE (pulmonary thromboembolism) (HCC) 12/11/2015   Leiomyoma 11/29/2015   Visit for preventive health examination 06/13/2011   GENITAL HERPES, HX OF 11/03/2009   Iron deficiency anemia 06/09/2007   Past Medical History:  Diagnosis Date   Hx of pulmonary embolus 07/23/2017   In vitro fertilization 07/02/2016   Hematology supervision in view of previous PE 12/11/15   Iron deficiency anemia    PCOS (polycystic ovarian syndrome)    Pregnancy, high-risk 07/23/2017   Uterine fibroid    Wears glasses    Past Surgical History:  Procedure Laterality Date   CESAREAN SECTION WITH BILATERAL TUBAL LIGATION N/A 08/22/2017   Procedure: PRIMARY CESAREAN SECTION WITH BILATERAL TUBAL LIGATION;  Surgeon: Maxie Better, MD;  Location: [facility];  Service: Obstetrics;  Laterality: N/A;  Classical incision EDD: 09/12/17 Allergy: Vicodin   CHROMOPERTUBATION N/A 11/29/2015   Procedure: ABDOMINAL MYOMECTOMY;  Surgeon: Fermin Schwab, MD;  Location: [facility];  Service: Gynecology;  Laterality: N/A;   IR ANGIOGRAM PELVIS SELECTIVE OR SUPRASELECTIVE  01/29/2022   IR ANGIOGRAM SELECTIVE EACH ADDITIONAL VESSEL  01/29/2022   IR ANGIOGRAM SELECTIVE EACH ADDITIONAL VESSEL  01/29/2022   IR EMBO TUMOR ORGAN ISCHEMIA INFARCT INC GUIDE ROADMAPPING  01/29/2022   IR RADIOLOGIST EVAL & MGMT  11/22/2021   IR RADIOLOGIST EVAL & MGMT  04/05/2022   IR RADIOLOGIST EVAL & MGMT  06/13/2022   IR US GUIDE VASC ACCESS RIGHT  01/29/2022   MYOMECTOMY     MYOMECTOMY N/A 08/22/2017   Procedure: MYOMECTOMY;  Surgeon: Maxie Better, MD;  Location: [facility];  Service: Obstetrics;  Laterality: N/A;   NO PAST SURGERIES     Social History   Tobacco Use   Smoking status: Never   Smokeless tobacco: Never  Substance Use Topics   Alcohol use: No   Drug use: No   Family History  Problem Relation Age of Onset   Hypertension Mother    Prostate cancer Paternal Uncle    Diabetes Maternal Grandmother    Leukemia Cousin    Brain cancer Cousin    Deep vein thrombosis Father    Varicose Veins Father    Allergies  Allergen Reactions   Norco [Hydrocodone-Acetaminophen] Nausea And Vomiting   Oxycodone-Acetaminophen Nausea And Vomiting      ROS Negative unless stated above    Objective:     BP 124/80 (BP Location: Left Arm, Patient Position: Sitting, Cuff Size: Normal)   Pulse 74   Temp 98.6 F (37 C) (Oral)  Ht 5' 1.5" (1.562 m)   Wt 140 lb 9.6 oz (63.8 kg)   LMP  (LMP Unknown)   SpO2 98%   BMI 26.14 kg/m  BP Readings from Last 3 Encounters:  08/08/23 124/80  05/20/23 117/80  01/15/23 137/85   Wt Readings from Last 3 Encounters:  08/08/23 140 lb 9.6 oz (63.8 kg)  05/20/23 142 lb 14.4 oz (64.8 kg)  01/15/23 147 lb (66.7 kg)      Physical Exam Constitutional:      General: She is not in acute distress.    Appearance: Normal appearance.  HENT:     Head: Normocephalic and atraumatic.     Nose:     Left Sinus: Maxillary sinus  tenderness present.     Comments: Tenderness at the ethmoid sinuses.    Mouth/Throat:     Mouth: Mucous membranes are moist.  Cardiovascular:     Rate and Rhythm: Normal rate and regular rhythm.     Heart sounds: Normal heart sounds. No murmur heard.    No gallop.  Pulmonary:     Effort: Pulmonary effort is normal. No respiratory distress.     Breath sounds: Normal breath sounds. No wheezing, rhonchi or rales.  Skin:    General: Skin is warm and dry.  Neurological:     Mental Status: She is alert and oriented to person, place, and time.      No results found for any visits on 08/08/23.    Assessment & Plan:  Acute pansinusitis, recurrence not specified -     Amoxicillin-Pot Clavulanate; Take 1 tablet by mouth in the morning and at bedtime for 7 days.  Dispense: 14 tablet; Refill: 0  Seasonal allergic rhinitis due to pollen  Patient with acute URI symptoms 2/2 seasonal allergies which have progressed into pansinusitis.  Start ABX.  Continue OTC antihistamine.  Consider saline nasal rinse or Flonase nasal spray to help with symptoms.  Follow-up with PCP as needed for continued or worsening symptoms.  Return if symptoms worsen or fail to improve.   Deeann Saint, MD

## 2023-08-15 DIAGNOSIS — F4321 Adjustment disorder with depressed mood: Secondary | ICD-10-CM | POA: Diagnosis not present

## 2023-08-27 DIAGNOSIS — F4321 Adjustment disorder with depressed mood: Secondary | ICD-10-CM | POA: Diagnosis not present

## 2023-09-12 DIAGNOSIS — F4321 Adjustment disorder with depressed mood: Secondary | ICD-10-CM | POA: Diagnosis not present

## 2023-09-26 DIAGNOSIS — F4321 Adjustment disorder with depressed mood: Secondary | ICD-10-CM | POA: Diagnosis not present

## 2023-10-07 DIAGNOSIS — Z13220 Encounter for screening for lipoid disorders: Secondary | ICD-10-CM | POA: Diagnosis not present

## 2023-10-07 DIAGNOSIS — Z8639 Personal history of other endocrine, nutritional and metabolic disease: Secondary | ICD-10-CM | POA: Diagnosis not present

## 2023-10-07 DIAGNOSIS — Z0000 Encounter for general adult medical examination without abnormal findings: Secondary | ICD-10-CM | POA: Diagnosis not present

## 2023-10-07 DIAGNOSIS — Z131 Encounter for screening for diabetes mellitus: Secondary | ICD-10-CM | POA: Diagnosis not present

## 2023-10-10 DIAGNOSIS — F4321 Adjustment disorder with depressed mood: Secondary | ICD-10-CM | POA: Diagnosis not present

## 2023-10-24 DIAGNOSIS — F4321 Adjustment disorder with depressed mood: Secondary | ICD-10-CM | POA: Diagnosis not present

## 2023-11-19 DIAGNOSIS — F4321 Adjustment disorder with depressed mood: Secondary | ICD-10-CM | POA: Diagnosis not present

## 2023-11-22 ENCOUNTER — Encounter: Payer: Self-pay | Admitting: Advanced Practice Midwife

## 2023-11-25 ENCOUNTER — Ambulatory Visit: Payer: BC Managed Care – PPO | Admitting: Oncology

## 2023-11-25 ENCOUNTER — Other Ambulatory Visit: Payer: BC Managed Care – PPO

## 2023-12-03 DIAGNOSIS — F4321 Adjustment disorder with depressed mood: Secondary | ICD-10-CM | POA: Diagnosis not present

## 2023-12-06 ENCOUNTER — Inpatient Hospital Stay: Admitting: Oncology

## 2023-12-06 ENCOUNTER — Inpatient Hospital Stay: Attending: Oncology

## 2023-12-06 VITALS — BP 128/81 | HR 74 | Temp 97.8°F | Resp 18 | Ht 61.5 in | Wt 146.8 lb

## 2023-12-06 DIAGNOSIS — Z86711 Personal history of pulmonary embolism: Secondary | ICD-10-CM | POA: Insufficient documentation

## 2023-12-06 DIAGNOSIS — D259 Leiomyoma of uterus, unspecified: Secondary | ICD-10-CM | POA: Diagnosis not present

## 2023-12-06 DIAGNOSIS — D500 Iron deficiency anemia secondary to blood loss (chronic): Secondary | ICD-10-CM | POA: Diagnosis not present

## 2023-12-06 DIAGNOSIS — Z7901 Long term (current) use of anticoagulants: Secondary | ICD-10-CM | POA: Insufficient documentation

## 2023-12-06 DIAGNOSIS — E282 Polycystic ovarian syndrome: Secondary | ICD-10-CM | POA: Diagnosis not present

## 2023-12-06 DIAGNOSIS — N920 Excessive and frequent menstruation with regular cycle: Secondary | ICD-10-CM | POA: Insufficient documentation

## 2023-12-06 LAB — CBC WITH DIFFERENTIAL (CANCER CENTER ONLY)
Abs Immature Granulocytes: 0.01 K/uL (ref 0.00–0.07)
Basophils Absolute: 0 K/uL (ref 0.0–0.1)
Basophils Relative: 1 %
Eosinophils Absolute: 0 K/uL (ref 0.0–0.5)
Eosinophils Relative: 1 %
HCT: 32.3 % — ABNORMAL LOW (ref 36.0–46.0)
Hemoglobin: 9.5 g/dL — ABNORMAL LOW (ref 12.0–15.0)
Immature Granulocytes: 0 %
Lymphocytes Relative: 35 %
Lymphs Abs: 1.3 K/uL (ref 0.7–4.0)
MCH: 20 pg — ABNORMAL LOW (ref 26.0–34.0)
MCHC: 29.4 g/dL — ABNORMAL LOW (ref 30.0–36.0)
MCV: 68.1 fL — ABNORMAL LOW (ref 80.0–100.0)
Monocytes Absolute: 0.4 K/uL (ref 0.1–1.0)
Monocytes Relative: 10 %
Neutro Abs: 2 K/uL (ref 1.7–7.7)
Neutrophils Relative %: 53 %
Platelet Count: 220 K/uL (ref 150–400)
RBC: 4.74 MIL/uL (ref 3.87–5.11)
RDW: 14.8 % (ref 11.5–15.5)
WBC Count: 3.8 K/uL — ABNORMAL LOW (ref 4.0–10.5)
nRBC: 0 % (ref 0.0–0.2)

## 2023-12-06 LAB — FERRITIN: Ferritin: 10 ng/mL — ABNORMAL LOW (ref 11–307)

## 2023-12-06 NOTE — Progress Notes (Signed)
  [facility] OFFICE PROGRESS NOTE   Diagnosis: Iron  deficiency anemia  INTERVAL HISTORY:   Jessica Dudley returns as scheduled.  Her menstrual cycle has returned and is heavy.  She takes iron  approximately every other day.  Iron  causes constipation. Uterine fibroids are smaller.  She is considering another uterine artery embolization, but her insurance will not cover this.  She is scheduled to see Dr. Rutherford to consider options for treating the menorrhagia.  She feels lightheaded.  She feels the hemoglobin is low.  She reports tolerating IV iron  well in the past, but is concerned her insurance will not cover this.  Objective:  Vital signs in last 24 hours:  Blood pressure 128/81, pulse 74, temperature 97.8 F (36.6 C), temperature source Temporal, resp. rate 18, height 5' 1.5 (1.562 m), weight 146 lb 12.8 oz (66.6 kg), SpO2 100%.   Resp: Lungs clear bilaterally Cardio: Regular rate and rhythm GI: No hepatosplenomegaly, no palpable fibroids Vascular: No leg edema   Lab Results:  Lab Results  Component Value Date   WBC 3.8 (L) 12/06/2023   HGB 9.5 (L) 12/06/2023   HCT 32.3 (L) 12/06/2023   MCV 68.1 (L) 12/06/2023   PLT 220 12/06/2023   NEUTROABS 2.0 12/06/2023    CMP  Lab Results  Component Value Date   NA 133 (L) 08/10/2022   K 3.8 08/10/2022   CL 105 08/10/2022   CO2 23 08/10/2022   GLUCOSE 70 08/10/2022   BUN 9 08/10/2022   CREATININE 0.83 08/10/2022   CALCIUM 8.5 (L) 08/10/2022   PROT 7.2 08/10/2022   ALBUMIN 4.2 08/10/2022   AST 16 08/10/2022   ALT 18 08/10/2022   ALKPHOS 42 08/10/2022   BILITOT 0.5 08/10/2022   GFRNONAA >60 08/10/2022   GFRAA >60 07/16/2017    Medications: I have reviewed the patient's current medications.   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  I recommended she resume iron  twice daily.  She will try MiraLAX  for constipation.  She is scheduled to see Dr. Rutherford to discuss options for treating the menorrhagia. Jessica Dudley reports tolerating IV iron  well in the past.  She does not wish to receive IV iron  until after she sees Dr. Rutherford.  She will be scheduled for an office visit and as needed IV iron  on 12/23/2023.  She will call in the interim if she has increased symptoms of anemia.  Arley Hof, MD  12/06/2023  8:51 AM

## 2023-12-12 DIAGNOSIS — R109 Unspecified abdominal pain: Secondary | ICD-10-CM | POA: Diagnosis not present

## 2023-12-12 DIAGNOSIS — K5904 Chronic idiopathic constipation: Secondary | ICD-10-CM | POA: Diagnosis not present

## 2023-12-12 DIAGNOSIS — D509 Iron deficiency anemia, unspecified: Secondary | ICD-10-CM | POA: Diagnosis not present

## 2023-12-12 DIAGNOSIS — R140 Abdominal distension (gaseous): Secondary | ICD-10-CM | POA: Diagnosis not present

## 2023-12-16 DIAGNOSIS — Z114 Encounter for screening for human immunodeficiency virus [HIV]: Secondary | ICD-10-CM | POA: Diagnosis not present

## 2023-12-16 DIAGNOSIS — D251 Intramural leiomyoma of uterus: Secondary | ICD-10-CM | POA: Diagnosis not present

## 2023-12-16 DIAGNOSIS — R350 Frequency of micturition: Secondary | ICD-10-CM | POA: Diagnosis not present

## 2023-12-16 DIAGNOSIS — N946 Dysmenorrhea, unspecified: Secondary | ICD-10-CM | POA: Diagnosis not present

## 2023-12-16 DIAGNOSIS — Z01419 Encounter for gynecological examination (general) (routine) without abnormal findings: Secondary | ICD-10-CM | POA: Diagnosis not present

## 2023-12-16 DIAGNOSIS — Z113 Encounter for screening for infections with a predominantly sexual mode of transmission: Secondary | ICD-10-CM | POA: Diagnosis not present

## 2023-12-16 DIAGNOSIS — Z1159 Encounter for screening for other viral diseases: Secondary | ICD-10-CM | POA: Diagnosis not present

## 2023-12-16 DIAGNOSIS — Z8619 Personal history of other infectious and parasitic diseases: Secondary | ICD-10-CM | POA: Diagnosis not present

## 2023-12-18 DIAGNOSIS — F4321 Adjustment disorder with depressed mood: Secondary | ICD-10-CM | POA: Diagnosis not present

## 2023-12-23 ENCOUNTER — Inpatient Hospital Stay

## 2023-12-23 ENCOUNTER — Inpatient Hospital Stay: Admitting: Nurse Practitioner

## 2023-12-30 ENCOUNTER — Ambulatory Visit

## 2023-12-30 ENCOUNTER — Other Ambulatory Visit

## 2024-01-02 DIAGNOSIS — F4321 Adjustment disorder with depressed mood: Secondary | ICD-10-CM | POA: Diagnosis not present

## 2024-01-13 ENCOUNTER — Encounter: Payer: Self-pay | Admitting: Nurse Practitioner

## 2024-01-13 ENCOUNTER — Other Ambulatory Visit: Payer: Self-pay

## 2024-01-13 ENCOUNTER — Inpatient Hospital Stay

## 2024-01-13 ENCOUNTER — Inpatient Hospital Stay: Attending: Oncology | Admitting: Nurse Practitioner

## 2024-01-13 VITALS — BP 121/77 | HR 80 | Temp 97.8°F | Resp 18 | Ht 61.5 in | Wt 145.0 lb

## 2024-01-13 DIAGNOSIS — D649 Anemia, unspecified: Secondary | ICD-10-CM

## 2024-01-13 DIAGNOSIS — N920 Excessive and frequent menstruation with regular cycle: Secondary | ICD-10-CM | POA: Diagnosis not present

## 2024-01-13 DIAGNOSIS — E282 Polycystic ovarian syndrome: Secondary | ICD-10-CM | POA: Insufficient documentation

## 2024-01-13 DIAGNOSIS — Z86711 Personal history of pulmonary embolism: Secondary | ICD-10-CM | POA: Diagnosis not present

## 2024-01-13 DIAGNOSIS — D500 Iron deficiency anemia secondary to blood loss (chronic): Secondary | ICD-10-CM | POA: Diagnosis not present

## 2024-01-13 LAB — CBC WITH DIFFERENTIAL (CANCER CENTER ONLY)
Abs Immature Granulocytes: 0.01 K/uL (ref 0.00–0.07)
Basophils Absolute: 0.1 K/uL (ref 0.0–0.1)
Basophils Relative: 1 %
Eosinophils Absolute: 0 K/uL (ref 0.0–0.5)
Eosinophils Relative: 1 %
HCT: 35.8 % — ABNORMAL LOW (ref 36.0–46.0)
Hemoglobin: 10.7 g/dL — ABNORMAL LOW (ref 12.0–15.0)
Immature Granulocytes: 0 %
Lymphocytes Relative: 34 %
Lymphs Abs: 1.5 K/uL (ref 0.7–4.0)
MCH: 20.2 pg — ABNORMAL LOW (ref 26.0–34.0)
MCHC: 29.9 g/dL — ABNORMAL LOW (ref 30.0–36.0)
MCV: 67.5 fL — ABNORMAL LOW (ref 80.0–100.0)
Monocytes Absolute: 0.5 K/uL (ref 0.1–1.0)
Monocytes Relative: 11 %
Neutro Abs: 2.4 K/uL (ref 1.7–7.7)
Neutrophils Relative %: 53 %
Platelet Count: 226 K/uL (ref 150–400)
RBC: 5.3 MIL/uL — ABNORMAL HIGH (ref 3.87–5.11)
RDW: 16.1 % — ABNORMAL HIGH (ref 11.5–15.5)
WBC Count: 4.6 K/uL (ref 4.0–10.5)
nRBC: 0 % (ref 0.0–0.2)

## 2024-01-13 LAB — FERRITIN: Ferritin: 12 ng/mL (ref 11–307)

## 2024-01-13 NOTE — Progress Notes (Signed)
  [facility] OFFICE PROGRESS NOTE   Diagnosis: Iron  deficiency anemia  INTERVAL HISTORY:   Ms. Weimann returns as scheduled.  Overall feeling better.  Energy level has improved.  She continues to have intermittent heavy menstrual blood loss.  She is taking oral iron  several times a week.  She notes mild constipation.  No nausea.  She eats an iron  rich diet.  Objective:  Vital signs in last 24 hours:  Blood pressure 121/77, pulse 80, temperature 97.8 F (36.6 C), temperature source Temporal, resp. rate 18, height 5' 1.5 (1.562 m), weight 145 lb (65.8 kg), SpO2 100%.    Resp: Lungs clear bilaterally. Cardio: Regular rate and rhythm. GI: No hepatosplenomegaly.  Nontender. Vascular: No leg edema.    Lab Results:  Lab Results  Component Value Date   WBC 4.6 01/13/2024   HGB 10.7 (L) 01/13/2024   HCT 35.8 (L) 01/13/2024   MCV 67.5 (L) 01/13/2024   PLT 226 01/13/2024   NEUTROABS 2.4 01/13/2024    Imaging:  No results found.  Medications: I have reviewed the patient's current medications.  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 Amburn appears stable.  She has iron  deficiency anemia secondary to menorrhagia.  CBC from today shows improvement in the hemoglobin, MCV remains low.  Ferritin is in low normal range.  She will try to increase oral iron  from several times a week to once daily.  We discussed a stool softener and MiraLAX .  She declines IV iron  until she is able to confirm cost with her insurance company.  She will return for a CBC and ferritin in 4 weeks.  Lab and office visit  in 8 weeks.  We are available to see her sooner if needed.    Olam Ned ANP/GNP-BC   01/13/2024  10:47 AM

## 2024-01-16 DIAGNOSIS — F4321 Adjustment disorder with depressed mood: Secondary | ICD-10-CM | POA: Diagnosis not present

## 2024-01-30 DIAGNOSIS — F4321 Adjustment disorder with depressed mood: Secondary | ICD-10-CM | POA: Diagnosis not present

## 2024-02-10 ENCOUNTER — Inpatient Hospital Stay: Attending: Oncology

## 2024-02-10 DIAGNOSIS — D500 Iron deficiency anemia secondary to blood loss (chronic): Secondary | ICD-10-CM | POA: Insufficient documentation

## 2024-02-10 DIAGNOSIS — N920 Excessive and frequent menstruation with regular cycle: Secondary | ICD-10-CM | POA: Insufficient documentation

## 2024-02-10 LAB — CBC WITH DIFFERENTIAL (CANCER CENTER ONLY)
Abs Immature Granulocytes: 0.01 K/uL (ref 0.00–0.07)
Basophils Absolute: 0.1 K/uL (ref 0.0–0.1)
Basophils Relative: 1 %
Eosinophils Absolute: 0 K/uL (ref 0.0–0.5)
Eosinophils Relative: 1 %
HCT: 31.5 % — ABNORMAL LOW (ref 36.0–46.0)
Hemoglobin: 9.6 g/dL — ABNORMAL LOW (ref 12.0–15.0)
Immature Granulocytes: 0 %
Lymphocytes Relative: 32 %
Lymphs Abs: 1.4 K/uL (ref 0.7–4.0)
MCH: 20.1 pg — ABNORMAL LOW (ref 26.0–34.0)
MCHC: 30.5 g/dL (ref 30.0–36.0)
MCV: 65.9 fL — ABNORMAL LOW (ref 80.0–100.0)
Monocytes Absolute: 0.5 K/uL (ref 0.1–1.0)
Monocytes Relative: 12 %
Neutro Abs: 2.3 K/uL (ref 1.7–7.7)
Neutrophils Relative %: 54 %
Platelet Count: 230 K/uL (ref 150–400)
RBC: 4.78 MIL/uL (ref 3.87–5.11)
RDW: 17.1 % — ABNORMAL HIGH (ref 11.5–15.5)
WBC Count: 4.3 K/uL (ref 4.0–10.5)
nRBC: 0 % (ref 0.0–0.2)

## 2024-02-10 LAB — FERRITIN: Ferritin: 9 ng/mL — ABNORMAL LOW (ref 11–307)

## 2024-02-25 DIAGNOSIS — F4321 Adjustment disorder with depressed mood: Secondary | ICD-10-CM | POA: Diagnosis not present

## 2024-03-09 ENCOUNTER — Telehealth: Payer: Self-pay

## 2024-03-09 ENCOUNTER — Other Ambulatory Visit

## 2024-03-09 ENCOUNTER — Ambulatory Visit: Admitting: Oncology

## 2024-03-09 ENCOUNTER — Inpatient Hospital Stay: Admitting: Nurse Practitioner

## 2024-03-09 ENCOUNTER — Inpatient Hospital Stay: Attending: Oncology

## 2024-03-09 DIAGNOSIS — N920 Excessive and frequent menstruation with regular cycle: Secondary | ICD-10-CM | POA: Diagnosis not present

## 2024-03-09 DIAGNOSIS — D500 Iron deficiency anemia secondary to blood loss (chronic): Secondary | ICD-10-CM | POA: Diagnosis not present

## 2024-03-09 LAB — CBC WITH DIFFERENTIAL (CANCER CENTER ONLY)
Abs Immature Granulocytes: 0.02 K/uL (ref 0.00–0.07)
Basophils Absolute: 0 K/uL (ref 0.0–0.1)
Basophils Relative: 1 %
Eosinophils Absolute: 0 K/uL (ref 0.0–0.5)
Eosinophils Relative: 1 %
HCT: 31.8 % — ABNORMAL LOW (ref 36.0–46.0)
Hemoglobin: 9.5 g/dL — ABNORMAL LOW (ref 12.0–15.0)
Immature Granulocytes: 1 %
Lymphocytes Relative: 30 %
Lymphs Abs: 1.3 K/uL (ref 0.7–4.0)
MCH: 19.2 pg — ABNORMAL LOW (ref 26.0–34.0)
MCHC: 29.9 g/dL — ABNORMAL LOW (ref 30.0–36.0)
MCV: 64.4 fL — ABNORMAL LOW (ref 80.0–100.0)
Monocytes Absolute: 0.6 K/uL (ref 0.1–1.0)
Monocytes Relative: 13 %
Neutro Abs: 2.4 K/uL (ref 1.7–7.7)
Neutrophils Relative %: 54 %
Platelet Count: 242 K/uL (ref 150–400)
RBC: 4.94 MIL/uL (ref 3.87–5.11)
RDW: 16.4 % — ABNORMAL HIGH (ref 11.5–15.5)
WBC Count: 4.3 K/uL (ref 4.0–10.5)
nRBC: 0 % (ref 0.0–0.2)

## 2024-03-09 LAB — FERRITIN: Ferritin: 9 ng/mL — ABNORMAL LOW (ref 11–307)

## 2024-03-09 NOTE — Telephone Encounter (Signed)
-----   Message from Olam Ned sent at 03/09/2024 12:21 PM EST ----- Please let her know hemoglobin is stable, ferritin remains low.  Continue oral iron .  CBC and ferritin in 4 weeks.  CBC, ferritin, office visit with Dr. Cloretta in 8 weeks.

## 2024-03-09 NOTE — Telephone Encounter (Signed)
 The patient verbally confirmed her understanding and stated that she prefers to schedule her lab and appointment with the provider in January.

## 2024-03-12 DIAGNOSIS — F4321 Adjustment disorder with depressed mood: Secondary | ICD-10-CM | POA: Diagnosis not present

## 2024-03-19 DIAGNOSIS — R350 Frequency of micturition: Secondary | ICD-10-CM | POA: Diagnosis not present

## 2024-03-19 DIAGNOSIS — N898 Other specified noninflammatory disorders of vagina: Secondary | ICD-10-CM | POA: Diagnosis not present

## 2024-04-08 DIAGNOSIS — F4321 Adjustment disorder with depressed mood: Secondary | ICD-10-CM | POA: Diagnosis not present

## 2024-04-20 DIAGNOSIS — F4321 Adjustment disorder with depressed mood: Secondary | ICD-10-CM | POA: Diagnosis not present

## 2024-05-13 ENCOUNTER — Other Ambulatory Visit: Payer: Self-pay | Admitting: *Deleted

## 2024-05-13 DIAGNOSIS — D649 Anemia, unspecified: Secondary | ICD-10-CM

## 2024-05-18 ENCOUNTER — Inpatient Hospital Stay: Admitting: Oncology

## 2024-05-18 ENCOUNTER — Inpatient Hospital Stay: Attending: Oncology

## 2024-05-18 ENCOUNTER — Telehealth: Payer: Self-pay | Admitting: Oncology

## 2024-05-18 VITALS — BP 117/80 | HR 72 | Temp 98.3°F | Resp 16 | Wt 149.6 lb

## 2024-05-18 DIAGNOSIS — D259 Leiomyoma of uterus, unspecified: Secondary | ICD-10-CM | POA: Diagnosis not present

## 2024-05-18 DIAGNOSIS — D649 Anemia, unspecified: Secondary | ICD-10-CM

## 2024-05-18 DIAGNOSIS — N920 Excessive and frequent menstruation with regular cycle: Secondary | ICD-10-CM | POA: Insufficient documentation

## 2024-05-18 DIAGNOSIS — Z86711 Personal history of pulmonary embolism: Secondary | ICD-10-CM | POA: Diagnosis not present

## 2024-05-18 DIAGNOSIS — D500 Iron deficiency anemia secondary to blood loss (chronic): Secondary | ICD-10-CM | POA: Insufficient documentation

## 2024-05-18 LAB — CBC WITH DIFFERENTIAL (CANCER CENTER ONLY)
Abs Immature Granulocytes: 0.01 K/uL (ref 0.00–0.07)
Basophils Absolute: 0 K/uL (ref 0.0–0.1)
Basophils Relative: 1 %
Eosinophils Absolute: 0 K/uL (ref 0.0–0.5)
Eosinophils Relative: 1 %
HCT: 34.3 % — ABNORMAL LOW (ref 36.0–46.0)
Hemoglobin: 10.2 g/dL — ABNORMAL LOW (ref 12.0–15.0)
Immature Granulocytes: 0 %
Lymphocytes Relative: 36 %
Lymphs Abs: 1.3 K/uL (ref 0.7–4.0)
MCH: 19.2 pg — ABNORMAL LOW (ref 26.0–34.0)
MCHC: 29.7 g/dL — ABNORMAL LOW (ref 30.0–36.0)
MCV: 64.5 fL — ABNORMAL LOW (ref 80.0–100.0)
Monocytes Absolute: 0.3 K/uL (ref 0.1–1.0)
Monocytes Relative: 10 %
Neutro Abs: 1.8 K/uL (ref 1.7–7.7)
Neutrophils Relative %: 52 %
Platelet Count: 270 K/uL (ref 150–400)
RBC: 5.32 MIL/uL — ABNORMAL HIGH (ref 3.87–5.11)
RDW: 18.6 % — ABNORMAL HIGH (ref 11.5–15.5)
WBC Count: 3.5 K/uL — ABNORMAL LOW (ref 4.0–10.5)
nRBC: 0 % (ref 0.0–0.2)

## 2024-05-18 LAB — SAMPLE TO BLOOD BANK

## 2024-05-18 LAB — FERRITIN: Ferritin: 11 ng/mL (ref 11–307)

## 2024-05-18 NOTE — Telephone Encounter (Signed)
 Patient has been scheduled for follow-up visit per 05/18/2024 LOS.  LVM notifying pt of appt details, provided my direct number to pt if appt changes need to be made.

## 2024-05-18 NOTE — Progress Notes (Signed)
" °  [facility] OFFICE PROGRESS NOTE   Diagnosis: Iron  deficiency anemia  INTERVAL HISTORY:   Jessica Dudley returns as scheduled.  She has an irregular menstrual cycle.  She reports heavy menstrual bleeding for approximately 1 week with each cycle.  No other bleeding.  She takes iron  approximately once per week.  Iron  causes constipation.  Constipation is relieved with Metamucil. She reports she decided against a repeat embolization due to insurance issues.  She does not wish to have a hysterectomy.  She is followed by GYN and reports uterine fibroids are stable. Objective:  Vital signs in last 24 hours:  Blood pressure 117/80, pulse 72, temperature 98.3 F (36.8 C), temperature source Temporal, resp. rate 16, weight 149 lb 9.6 oz (67.9 kg), SpO2 100%. Resp: Lungs clear bilaterally Cardio: Regular rate and rhythm GI: No mass, no hepatosplenomegaly, slight fullness in the suprapubic region without discrete palpable fibroids Vascular: No leg edema   Lab Results:  Lab Results  Component Value Date   WBC 3.5 (L) 05/18/2024   HGB 10.2 (L) 05/18/2024   HCT 34.3 (L) 05/18/2024   MCV 64.5 (L) 05/18/2024   PLT 270 05/18/2024   NEUTROABS 1.8 05/18/2024    CMP  Lab Results  Component Value Date   NA 133 (L) 08/10/2022   K 3.8 08/10/2022   CL 105 08/10/2022   CO2 23 08/10/2022   GLUCOSE 70 08/10/2022   BUN 9 08/10/2022   CREATININE 0.83 08/10/2022   CALCIUM 8.5 (L) 08/10/2022   PROT 7.2 08/10/2022   ALBUMIN 4.2 08/10/2022   AST 16 08/10/2022   ALT 18 08/10/2022   ALKPHOS 42 08/10/2022   BILITOT 0.5 08/10/2022   GFRNONAA >60 08/10/2022   GFRAA >60 07/16/2017    No results found for: CEA1, CEA, CAN199, CA125  Lab Results  Component Value Date   INR 1.0 01/29/2022   LABPROT 13.3 01/29/2022    Imaging:  No results found.  Medications: I have reviewed the patient's current medications.   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  She is currently taking iron  only 1 day/week due to constipation.  I encouraged her to increase the ferrous sulfate  to at least 3 days/week.  She can use Metamucil, docusate, and MiraLAX  as needed for constipation.  She will call for symptoms of anemia.  She does not wish to receive IV iron  at present.  She will return for an office and lab visit in 3 months.  Arley Hof, MD  05/18/2024  11:41 AM   "

## 2024-08-25 ENCOUNTER — Inpatient Hospital Stay

## 2024-08-25 ENCOUNTER — Inpatient Hospital Stay: Admitting: Oncology
